# Patient Record
Sex: Female | Born: 1975 | Race: Black or African American | Hispanic: No | Marital: Single | Smoking: Current every day smoker
Health system: Southern US, Community
[De-identification: ages and names within clinical notes are randomized; demographics above are authoritative.]

## PROBLEM LIST (undated history)

## (undated) DIAGNOSIS — Z720 Tobacco use: Secondary | ICD-10-CM

## (undated) DIAGNOSIS — I110 Hypertensive heart disease with heart failure: Secondary | ICD-10-CM

## (undated) DIAGNOSIS — J189 Pneumonia, unspecified organism: Secondary | ICD-10-CM

## (undated) DIAGNOSIS — I119 Hypertensive heart disease without heart failure: Secondary | ICD-10-CM

## (undated) DIAGNOSIS — D649 Anemia, unspecified: Secondary | ICD-10-CM

## (undated) DIAGNOSIS — I5030 Unspecified diastolic (congestive) heart failure: Secondary | ICD-10-CM

## (undated) DIAGNOSIS — I10 Essential (primary) hypertension: Secondary | ICD-10-CM

## (undated) DIAGNOSIS — G4733 Obstructive sleep apnea (adult) (pediatric): Secondary | ICD-10-CM

## (undated) DIAGNOSIS — N189 Chronic kidney disease, unspecified: Secondary | ICD-10-CM

## (undated) DIAGNOSIS — I422 Other hypertrophic cardiomyopathy: Secondary | ICD-10-CM

## (undated) DIAGNOSIS — M1990 Unspecified osteoarthritis, unspecified site: Secondary | ICD-10-CM

## (undated) HISTORY — DX: Chronic kidney disease, unspecified: N18.9

## (undated) HISTORY — DX: Obstructive sleep apnea (adult) (pediatric): G47.33

---

## 2010-11-24 ENCOUNTER — Emergency Department (HOSPITAL_COMMUNITY)
Admission: EM | Admit: 2010-11-24 | Discharge: 2010-11-24 | Disposition: A | Discharge status: Home / Self Care | Payer: Self-pay | Attending: Emergency Medicine | Admitting: Emergency Medicine

## 2010-11-24 DIAGNOSIS — T169XXA Foreign body in ear, unspecified ear, initial encounter: Secondary | ICD-10-CM | POA: Insufficient documentation

## 2010-11-24 DIAGNOSIS — Y92009 Unspecified place in unspecified non-institutional (private) residence as the place of occurrence of the external cause: Secondary | ICD-10-CM | POA: Insufficient documentation

## 2010-11-24 DIAGNOSIS — IMO0002 Reserved for concepts with insufficient information to code with codable children: Secondary | ICD-10-CM | POA: Insufficient documentation

## 2010-11-24 DIAGNOSIS — I10 Essential (primary) hypertension: Secondary | ICD-10-CM | POA: Insufficient documentation

## 2012-07-05 HISTORY — PX: AV FISTULA PLACEMENT: SHX1204

## 2012-07-25 ENCOUNTER — Emergency Department (HOSPITAL_COMMUNITY): Payer: Medicaid Other

## 2012-07-25 ENCOUNTER — Inpatient Hospital Stay (HOSPITAL_COMMUNITY): Payer: Medicaid Other

## 2012-07-25 ENCOUNTER — Encounter (HOSPITAL_COMMUNITY): Payer: Self-pay | Admitting: Emergency Medicine

## 2012-07-25 ENCOUNTER — Inpatient Hospital Stay (HOSPITAL_COMMUNITY)
Admission: EM | Admit: 2012-07-25 | Discharge: 2012-08-02 | DRG: 264 | Disposition: A | Discharge status: Home / Self Care | Payer: Medicaid Other | Attending: Family Medicine | Admitting: Family Medicine

## 2012-07-25 DIAGNOSIS — IMO0001 Reserved for inherently not codable concepts without codable children: Principal | ICD-10-CM | POA: Diagnosis present

## 2012-07-25 DIAGNOSIS — D72829 Elevated white blood cell count, unspecified: Secondary | ICD-10-CM | POA: Diagnosis present

## 2012-07-25 DIAGNOSIS — G4733 Obstructive sleep apnea (adult) (pediatric): Secondary | ICD-10-CM | POA: Diagnosis present

## 2012-07-25 DIAGNOSIS — N179 Acute kidney failure, unspecified: Secondary | ICD-10-CM | POA: Diagnosis present

## 2012-07-25 DIAGNOSIS — I498 Other specified cardiac arrhythmias: Secondary | ICD-10-CM | POA: Diagnosis present

## 2012-07-25 DIAGNOSIS — I43 Cardiomyopathy in diseases classified elsewhere: Secondary | ICD-10-CM | POA: Diagnosis present

## 2012-07-25 DIAGNOSIS — I82619 Acute embolism and thrombosis of superficial veins of unspecified upper extremity: Secondary | ICD-10-CM | POA: Diagnosis present

## 2012-07-25 DIAGNOSIS — I214 Non-ST elevation (NSTEMI) myocardial infarction: Secondary | ICD-10-CM

## 2012-07-25 DIAGNOSIS — N039 Chronic nephritic syndrome with unspecified morphologic changes: Secondary | ICD-10-CM | POA: Diagnosis present

## 2012-07-25 DIAGNOSIS — R7989 Other specified abnormal findings of blood chemistry: Secondary | ICD-10-CM

## 2012-07-25 DIAGNOSIS — E872 Acidosis, unspecified: Secondary | ICD-10-CM | POA: Diagnosis present

## 2012-07-25 DIAGNOSIS — Z6841 Body Mass Index (BMI) 40.0 and over, adult: Secondary | ICD-10-CM

## 2012-07-25 DIAGNOSIS — F39 Unspecified mood [affective] disorder: Secondary | ICD-10-CM | POA: Diagnosis present

## 2012-07-25 DIAGNOSIS — F17200 Nicotine dependence, unspecified, uncomplicated: Secondary | ICD-10-CM | POA: Diagnosis present

## 2012-07-25 DIAGNOSIS — E876 Hypokalemia: Secondary | ICD-10-CM | POA: Diagnosis not present

## 2012-07-25 DIAGNOSIS — I509 Heart failure, unspecified: Secondary | ICD-10-CM | POA: Diagnosis present

## 2012-07-25 DIAGNOSIS — N185 Chronic kidney disease, stage 5: Secondary | ICD-10-CM | POA: Diagnosis present

## 2012-07-25 DIAGNOSIS — N2581 Secondary hyperparathyroidism of renal origin: Secondary | ICD-10-CM | POA: Diagnosis present

## 2012-07-25 DIAGNOSIS — I5033 Acute on chronic diastolic (congestive) heart failure: Secondary | ICD-10-CM | POA: Diagnosis present

## 2012-07-25 DIAGNOSIS — Z79899 Other long term (current) drug therapy: Secondary | ICD-10-CM

## 2012-07-25 DIAGNOSIS — R5381 Other malaise: Secondary | ICD-10-CM | POA: Diagnosis present

## 2012-07-25 DIAGNOSIS — E039 Hypothyroidism, unspecified: Secondary | ICD-10-CM | POA: Diagnosis present

## 2012-07-25 DIAGNOSIS — R000 Tachycardia, unspecified: Secondary | ICD-10-CM | POA: Diagnosis present

## 2012-07-25 DIAGNOSIS — Z720 Tobacco use: Secondary | ICD-10-CM

## 2012-07-25 DIAGNOSIS — Z66 Do not resuscitate: Secondary | ICD-10-CM | POA: Diagnosis present

## 2012-07-25 DIAGNOSIS — D631 Anemia in chronic kidney disease: Secondary | ICD-10-CM | POA: Diagnosis present

## 2012-07-25 DIAGNOSIS — I119 Hypertensive heart disease without heart failure: Secondary | ICD-10-CM | POA: Diagnosis present

## 2012-07-25 DIAGNOSIS — N184 Chronic kidney disease, stage 4 (severe): Secondary | ICD-10-CM | POA: Diagnosis present

## 2012-07-25 DIAGNOSIS — I5030 Unspecified diastolic (congestive) heart failure: Secondary | ICD-10-CM | POA: Diagnosis present

## 2012-07-25 DIAGNOSIS — I10 Essential (primary) hypertension: Secondary | ICD-10-CM | POA: Insufficient documentation

## 2012-07-25 DIAGNOSIS — I110 Hypertensive heart disease with heart failure: Secondary | ICD-10-CM

## 2012-07-25 HISTORY — DX: Hypertensive heart disease with heart failure: I11.0

## 2012-07-25 HISTORY — DX: Unspecified diastolic (congestive) heart failure: I50.30

## 2012-07-25 HISTORY — DX: Hypertensive heart disease without heart failure: I11.9

## 2012-07-25 HISTORY — DX: Morbid (severe) obesity due to excess calories: E66.01

## 2012-07-25 HISTORY — DX: Essential (primary) hypertension: I10

## 2012-07-25 HISTORY — DX: Other hypertrophic cardiomyopathy: I42.2

## 2012-07-25 HISTORY — DX: Tobacco use: Z72.0

## 2012-07-25 LAB — CBC WITH DIFFERENTIAL/PLATELET
Basophils Absolute: 0 10*3/uL (ref 0.0–0.1)
HCT: 36.3 % (ref 36.0–46.0)
Hemoglobin: 13.1 g/dL (ref 12.0–15.0)
Lymphocytes Relative: 16 % (ref 12–46)
Lymphs Abs: 1.9 10*3/uL (ref 0.7–4.0)
Monocytes Absolute: 0.7 10*3/uL (ref 0.1–1.0)
Monocytes Relative: 6 % (ref 3–12)
Neutro Abs: 9.2 10*3/uL — ABNORMAL HIGH (ref 1.7–7.7)
RBC: 4.58 MIL/uL (ref 3.87–5.11)
WBC: 12.1 10*3/uL — ABNORMAL HIGH (ref 4.0–10.5)

## 2012-07-25 LAB — BASIC METABOLIC PANEL
BUN: 33 mg/dL — ABNORMAL HIGH (ref 6–23)
CO2: 18 mEq/L — ABNORMAL LOW (ref 19–32)
Chloride: 104 mEq/L (ref 96–112)
Creatinine, Ser: 4.03 mg/dL — ABNORMAL HIGH (ref 0.50–1.10)
Glucose, Bld: 104 mg/dL — ABNORMAL HIGH (ref 70–99)

## 2012-07-25 LAB — D-DIMER, QUANTITATIVE: D-Dimer, Quant: 2.46 ug/mL-FEU — ABNORMAL HIGH (ref 0.00–0.48)

## 2012-07-25 LAB — POCT I-STAT TROPONIN I

## 2012-07-25 MED ORDER — NITROGLYCERIN 0.4 MG SL SUBL: 0.4000 mg | SUBLINGUAL_TABLET | SUBLINGUAL | Status: DC | PRN

## 2012-07-25 MED ORDER — DEXTROSE 5 % IV SOLN
2.0000 mg/min | INTRAVENOUS | Status: DC
  Administered 2012-07-25: 2 mg/min via INTRAVENOUS
  Administered 2012-07-26: 1 mg/min via INTRAVENOUS
  Administered 2012-07-26: 2 mg/min via INTRAVENOUS
  Administered 2012-07-27 (×2): 0.75 mg/min via INTRAVENOUS
  Filled 2012-07-25 (×6): qty 100

## 2012-07-25 MED ORDER — FUROSEMIDE 10 MG/ML IJ SOLN
40.0000 mg | Freq: Once | INTRAMUSCULAR | Status: AC
  Administered 2012-07-25: 40 mg via INTRAVENOUS
  Filled 2012-07-25: qty 4

## 2012-07-25 MED ORDER — INSULIN ASPART 100 UNIT/ML ~~LOC~~ SOLN: 0.0000 [IU] | Freq: Every day | SUBCUTANEOUS | Status: DC

## 2012-07-25 MED ORDER — HYDRALAZINE HCL 20 MG/ML IJ SOLN: 10.0000 mg | Freq: Four times a day (QID) | INTRAMUSCULAR | Status: DC | PRN

## 2012-07-25 MED ORDER — WHITE PETROLATUM GEL
Status: AC
  Administered 2012-07-26: 0.2
  Filled 2012-07-25: qty 5

## 2012-07-25 MED ORDER — HEPARIN BOLUS VIA INFUSION
5000.0000 [IU] | Freq: Once | INTRAVENOUS | Status: AC
  Administered 2012-07-25: 5000 [IU] via INTRAVENOUS

## 2012-07-25 MED ORDER — TECHNETIUM TC 99M DIETHYLENETRIAME-PENTAACETIC ACID: 40.0000 | Freq: Once | INTRAVENOUS | Status: AC | PRN

## 2012-07-25 MED ORDER — HEPARIN (PORCINE) IN NACL 100-0.45 UNIT/ML-% IJ SOLN
1800.0000 [IU]/h | INTRAMUSCULAR | Status: AC
  Administered 2012-07-25: 1400 [IU]/h via INTRAVENOUS
  Administered 2012-07-26: 1750 [IU]/h via INTRAVENOUS
  Administered 2012-07-26 – 2012-07-28 (×4): 1800 [IU]/h via INTRAVENOUS
  Filled 2012-07-25 (×9): qty 250

## 2012-07-25 MED ORDER — INSULIN ASPART 100 UNIT/ML ~~LOC~~ SOLN: 0.0000 [IU] | Freq: Three times a day (TID) | SUBCUTANEOUS | Status: DC

## 2012-07-25 MED ORDER — ACETAMINOPHEN 325 MG PO TABS: 650.0000 mg | ORAL_TABLET | ORAL | Status: DC | PRN

## 2012-07-25 MED ORDER — ASPIRIN EC 81 MG PO TBEC
81.0000 mg | DELAYED_RELEASE_TABLET | Freq: Every day | ORAL | Status: DC
  Administered 2012-07-26 – 2012-08-02 (×7): 81 mg via ORAL
  Filled 2012-07-25 (×8): qty 1

## 2012-07-25 MED ORDER — ASPIRIN 325 MG PO TABS
325.0000 mg | ORAL_TABLET | Freq: Once | ORAL | Status: AC
  Administered 2012-07-25: 325 mg via ORAL
  Filled 2012-07-25: qty 1

## 2012-07-25 MED ORDER — HEPARIN BOLUS VIA INFUSION: 4000.0000 [IU] | Freq: Once | INTRAVENOUS | Status: DC

## 2012-07-25 MED ORDER — TECHNETIUM TO 99M ALBUMIN AGGREGATED
6.0000 | Freq: Once | INTRAVENOUS | Status: AC | PRN
Start: 2012-07-25 — End: 2012-07-25
  Administered 2012-07-25: 6 via INTRAVENOUS

## 2012-07-25 MED ORDER — SODIUM CHLORIDE 0.9 % IJ SOLN
3.0000 mL | Freq: Two times a day (BID) | INTRAMUSCULAR | Status: DC
  Administered 2012-07-26 – 2012-08-02 (×13): 3 mL via INTRAVENOUS

## 2012-07-25 MED ORDER — FUROSEMIDE 10 MG/ML IJ SOLN
80.0000 mg | Freq: Two times a day (BID) | INTRAMUSCULAR | Status: DC
  Administered 2012-07-26 (×3): 80 mg via INTRAVENOUS
  Filled 2012-07-25 (×6): qty 8

## 2012-07-25 MED ORDER — ATORVASTATIN CALCIUM 20 MG PO TABS
20.0000 mg | ORAL_TABLET | Freq: Every day | ORAL | Status: DC
  Administered 2012-07-26 – 2012-08-01 (×6): 20 mg via ORAL
  Filled 2012-07-25 (×8): qty 1

## 2012-07-25 MED ORDER — SODIUM CHLORIDE 0.9 % IV SOLN: 250.0000 mL | INTRAVENOUS | Status: DC | PRN

## 2012-07-25 MED ORDER — SODIUM CHLORIDE 0.9 % IJ SOLN: 3.0000 mL | INTRAMUSCULAR | Status: DC | PRN

## 2012-07-25 MED ORDER — ONDANSETRON HCL 4 MG/2ML IJ SOLN: 4.0000 mg | Freq: Four times a day (QID) | INTRAMUSCULAR | Status: DC | PRN

## 2012-07-25 MED ORDER — HYDRALAZINE HCL 25 MG PO TABS
25.0000 mg | ORAL_TABLET | Freq: Three times a day (TID) | ORAL | Status: DC
  Administered 2012-07-26 – 2012-07-27 (×3): 25 mg via ORAL
  Filled 2012-07-25 (×8): qty 1

## 2012-07-25 NOTE — ED Notes (Signed)
Attempted to call report.  Was told rn would return my call shortly

## 2012-07-25 NOTE — ED Notes (Signed)
Patient transported to X-ray 

## 2012-07-25 NOTE — Progress Notes (Addendum)
ANTICOAGULATION CONSULT NOTE - Initial Consult  Pharmacy Consult for heparin Indication: chest pain/ACS + r/o PE  No Known Allergies  Patient Measurements:   Heparin Dosing Weight: 91.6kg  Vital Signs: Temp: 98.3 F (36.8 C) (02/21 1656) Temp src: Oral (02/21 1656) BP: 124/103 mmHg (02/21 1656) Pulse Rate: 120 (02/21 1656)  Labs:  Recent Labs  07/25/12 1708  HGB 13.1  HCT 36.3  PLT 215  CREATININE 4.03*    CrCl is unknown because there is no height on file for the current visit.   Medical History: Past Medical History  Diagnosis Date  . Hypertension     Medications:  Infusions:  . heparin    . heparin      Assessment: 16 yof presented with SOB and CP. Troponin is mildly elevated and d-dimer is elevated as well. To start IV heparin for CP and r/o PE.   Goal of Therapy:  Heparin level 0.3-0.7 units/ml Monitor platelets by anticoagulation protocol: Yes   Plan:  1. Heparin bolus 5000 units IV x 1 2. Heparin gtt 1400 units/hr 3. Check an 8 hour heparin level 4. Daily heparin level and CBC 5. F/u CT for r/o PE  Ayham Word, Rande Lawman 07/25/2012,6:17 PM

## 2012-07-25 NOTE — ED Provider Notes (Signed)
History     CSN: [id number]  Arrival date & time 07/25/12  1648   First MD Initiated Contact with Patient 07/25/12 1751      Chief Complaint  Patient presents with  . Shortness of Breath    (Consider location/radiation/quality/duration/timing/severity/associated sxs/prior treatment) The history is provided by the patient.  Brittney Contreras is a 37 y.o. female here with shortness of breath and chest pain. Chest pain worse with exertion and worse with laying back for the last 3 weeks. She also has bilateral leg swelling but denies any history of PE or DVTs. He said her chest pain got worse this morning as what she is here. No cardiac issues but she is a smoker.   Past Medical History  Diagnosis Date  . Hypertension     History reviewed. No pertinent past surgical history.  History reviewed. No pertinent family history.  History  Substance Use Topics  . Smoking status: Current Every Day Smoker  . Smokeless tobacco: Not on file  . Alcohol Use: Yes     Comment: occ    OB History   Grav Para Term Preterm Abortions TAB SAB Ect Mult Living                  Review of Systems  Respiratory: Positive for shortness of breath.   Cardiovascular: Positive for chest pain.  All other systems reviewed and are negative.    Allergies  Review of patient's allergies indicates no known allergies.  Home Medications  No current outpatient prescriptions on file.  BP 222/147  Pulse 102  Temp(Src) 98.3 F (36.8 C) (Oral)  Resp 19  SpO2 93%  LMP 07/16/2012  Physical Exam  Nursing note and vitals reviewed. Constitutional: She is oriented to person, place, and time. She appears well-developed and well-nourished.  Uncomfortable, tachypneic   HENT:  Head: Normocephalic.  Mouth/Throat: Oropharynx is clear and moist.  Eyes: Conjunctivae are normal. Pupils are equal, round, and reactive to light.  Neck: Normal range of motion. Neck supple.  Cardiovascular: Regular rhythm and normal  heart sounds.   Tachycardic   Pulmonary/Chest: Effort normal and breath sounds normal. No respiratory distress. She has no wheezes. She has no rales.  Abdominal: Soft. Bowel sounds are normal.  Obese, nontender   Musculoskeletal: Normal range of motion.  1+ edema no calf tenderness   Neurological: She is alert and oriented to person, place, and time.  Skin: Skin is warm and dry.  Psychiatric: She has a normal mood and affect. Her behavior is normal. Judgment and thought content normal.    ED Course  Procedures (including critical care time)  CRITICAL CARE Performed by: Darl Householder, Mckinzee Spirito   Total critical care time: 45 min   Critical care time was exclusive of separately billable procedures and treating other patients.  Critical care was necessary to treat or prevent imminent or life-threatening deterioration.  Critical care was time spent personally by me on the following activities: development of treatment plan with patient and/or surrogate as well as nursing, discussions with consultants, evaluation of patient's response to treatment, examination of patient, obtaining history from patient or surrogate, ordering and performing treatments and interventions, ordering and review of laboratory studies, ordering and review of radiographic studies, pulse oximetry and re-evaluation of patient's condition.   Labs Reviewed  CBC WITH DIFFERENTIAL - Abnormal; Notable for the following:    WBC 12.1 (*)    MCHC 36.1 (*)    Neutro Abs 9.2 (*)    All other  components within normal limits  BASIC METABOLIC PANEL - Abnormal; Notable for the following:    CO2 18 (*)    Glucose, Bld 104 (*)    BUN 33 (*)    Creatinine, Ser 4.03 (*)    GFR calc non Af Amer 13 (*)    GFR calc Af Amer 15 (*)    All other components within normal limits  D-DIMER, QUANTITATIVE - Abnormal; Notable for the following:    D-Dimer, Quant 2.46 (*)    All other components within normal limits  PRO B NATRIURETIC PEPTIDE -  Abnormal; Notable for the following:    Pro B Natriuretic peptide (BNP) 20946.0 (*)    All other components within normal limits  POCT I-STAT TROPONIN I - Abnormal; Notable for the following:    Troponin i, poc 0.09 (*)    All other components within normal limits  CBC  HEPARIN LEVEL (UNFRACTIONATED)  BASIC METABOLIC PANEL  TSH  HEMOGLOBIN A1C  LIPID PANEL   Dg Chest 2 View  07/25/2012  *RADIOLOGY REPORT*  Clinical Data: Shortness of breath and cough.  CHEST - 2 VIEW  Comparison: No priors.  Findings: Lung volumes are normal.  No acute consolidative air space disease.  Mild diffuse interstitial prominence with some Kerley B lines in the periphery of the lungs.  Cephalization of the pulmonary vasculature.  Borderline cardiomegaly.  Upper mediastinal contours are within normal limits.  No definite pleural effusions.  IMPRESSION: 1.  Borderline cardiomegaly with findings suspicious for mild interstitial pulmonary edema.  Clinical correlation for signs and symptoms of mild congestive heart failure is recommended.   Original Report Authenticated By: Vinnie Langton, M.D.      No diagnosis found.   Date: 07/25/2012  Rate: 107  Rhythm: sinus tachycardia  QRS Axis: normal  Intervals: normal  ST/T Wave abnormalities: nonspecific ST changes  Conduction Disutrbances:none  Narrative Interpretation:   Old EKG Reviewed: none available     MDM  Brittney Contreras is a 37 y.o. female here with SOB, CP. Concerned for ACS vs CHF vs PE. Will get d-dimer, trop, CXR, BNP.   6:30 PM I called cardiology, Dr. Jake Michaelis, who will see her in the ED. Trop positive. I started heparin drip.   7 PM Cardiology at bedside. D-dimer positive, Cr 4. Can't get CTPA due to renal failure. VQ scan ordered. BNP 20000, lasix ordered. Cardiology ordered echo. I called Family medicine, who accepted the patient to step down.          Brittney Arthurs, MD 07/25/12 2015

## 2012-07-25 NOTE — ED Notes (Signed)
MD at bedside. Cardiology 

## 2012-07-25 NOTE — ED Notes (Signed)
Pt c/o increased SOB with exertion x 3 weeks and worse when laying flat; pt sts some swelling in legs

## 2012-07-25 NOTE — ED Notes (Signed)
Pt back from x-ray.

## 2012-07-25 NOTE — ED Notes (Signed)
Pt transported to VQ scan. 

## 2012-07-25 NOTE — H&P (Signed)
Family Medicine Teaching Service Admission H&P Service Pager: [phone]  Patient name: Brittney Contreras Medical record number: [id number] Date of birth: 01-30-1976 Age: 37 y.o. Gender: female  Primary Care Provider: Default, Provider, MD Attending Physician: Alveda Reasons, MD Consultants:   CARDIOLOGY: [facility]  NEPHROLOGY: need to be called  CODE STATUS: DNR  CC  Dyspnea  HPI  Brittney Contreras is a 37 y.o. year old female presenting with acute 1 day worsening of subacute dyspnea.  She reports she has had progressively worsening shortness of breath over the past 3 weeks, resulting in 3 pillow orthopnea, significant dyspnea on exertion (short of breath with going to bathroom) and acute worsened today.  She has associated non productive cough and occasional CP.  She is unable to state the last time she saw a doctor but has previously been diagnosed with HTN during in ED visit for ear pain in June of 2012.  Her BP was noted to be 246/105 during that ED and she was started on Hydralazine and directed to establish care.  No further workup was completed at that time.    On presentation to the ED her BP was 124/103 however increased to 222/147.  Other labs were pertinent for a POC troponin that was elevated to 0.09, acute kidney failure with a Cr of 4.03, CXR and proBNP [id number]) suggesting Congestive Heart Failure, and a D-Dimer of 2.46.  Cardiology evaluated her and started her on a Heparin drip, Lebetalol drip, prn Hydralazine, and IV lasix.  2D ECHO ordered.  EKG showing some evidence of ischemia with inverted T waves diffusely.  Her Lebetalol drip was delayed due to pt being in VQ scan to evaluate for PE.  ROS   Constitutional No   Infectious No fevers, no chills  Resp Dyspnea as above, no cough  Cardiac No overt chest pain, orthopnea  GI No N/V, no melana, no hematachezia  GU No dysuria, no changes in urinary habitis, urine clear  Psych No changes in mood, affect. Denies anxiety.  Reports  being "hard headed" and that's why she hasn't seen a doctor  Neuro No changes in vision, no headache  Activity Severely limited  Diet Ad lib, not restricting  MEDS None     HISTORY  PMHx:  Past Medical History  Diagnosis Date  . Hypertension     PSHx: History reviewed. No pertinent past surgical history.  Social Hx: History   Social History  . Marital Status: Single    Spouse Name: N/A    Number of Children: N/A  . Years of Education: N/A   Social History Main Topics  . Smoking status: Current Some Day Smoker    Types: Cigarettes  . Smokeless tobacco: None  . Alcohol Use: Yes     Comment: occ  . Drug Use: No     Comment: No hx of IV Use  . Sexually Active: None   Other Topics Concern  . None   Social History Narrative   Lives with her uncle   Was previously a CNA but currently unemployeed          Family Hx: Family History  Problem Relation Age of Onset  . Kidney disease      Aunt, deceased, had been on dialysis  . Hypertension Mother   . Heart disease Mother     Allergies: No Known Allergies  Home Medications: No prescriptions prior to admission    OBJECTIVE  Vitals: Temp:  [98.1 F (36.7 C)-98.3 F (36.8 C)]  98.1 F (36.7 C) (02/21 2231) Pulse Rate:  [83-120] 83 (02/21 2231) Resp:  [18-19] 19 (02/21 2231) BP: (124-222)/(103-147) 190/131 mmHg (02/21 2231) SpO2:  [93 %-95 %] 95 % (02/21 2231) Weight:  [291 lb 3.6 oz (132.1 kg)] 291 lb 3.6 oz (132.1 kg) (02/21 2231)  Weight: Wt Readings from Last 3 Encounters:  07/25/12 291 lb 3.6 oz (132.1 kg)    I&Os: Yesterday:   This shift:     PE: GENERAL:  Adult Morbidly Obese  female. In no discomfort; no respiratory distress. Sitting up in bed PSYCH: Alert and appropriately interactive; Insight:Good   H&N: AT/[facility], trachea midline EENT:  MMM, no scleral icterus, EOMi HEART: RRR, S1/S2 heard, no murmur LUNGS: CTA B, no wheezes, no crackles EXTREMITIES: Moves all 4 extremities  spontaneously, warm well perfused, no edema, bilateral DP and PT pulses 2/4.        LABS: CBC BMET   Recent Labs Lab 07/25/12 1708  WBC 12.1*  HGB 13.1  HCT 36.3  PLT 215    Recent Labs Lab 07/25/12 1708  NA 137  K 3.6  CL 104  CO2 18*  BUN 33*  CREATININE 4.03*  GLUCOSE 104*  CALCIUM 8.6      Recent Labs Lab 07/25/12 1739  TROPIPOC 0.09*    07/25/2012 17:08  D-Dimer, Quant 2.46 (H)    07/25/2012 17:11  Pro B Natriuretic peptide (BNP) 20946.0 (H)   URINE STUDIES: Pending >>>  MICRO: None  IMAGING: Dg Chest 2 View  07/25/2012  *RADIOLOGY REPORT*  Clinical Data: Shortness of breath and cough.  CHEST - 2 VIEW  Comparison: No priors.  Findings: Lung volumes are normal.  No acute consolidative air space disease.  Mild diffuse interstitial prominence with some Kerley B lines in the periphery of the lungs.  Cephalization of the pulmonary vasculature.  Borderline cardiomegaly.  Upper mediastinal contours are within normal limits.  No definite pleural effusions.  IMPRESSION: 1.  Borderline cardiomegaly with findings suspicious for mild interstitial pulmonary edema.  Clinical correlation for signs and symptoms of mild congestive heart failure is recommended.   Original Report Authenticated By: Vinnie Langton, M.D.    Nm Pulmonary Perf And Vent  07/25/2012  *RADIOLOGY REPORT*  Clinical Data:  Shortness of breath.  Rule out pulmonary embolism.  NUCLEAR MEDICINE VENTILATION - PERFUSION LUNG SCAN  Technique:  Ventilation images were obtained in multiple projections using inhaled aerosol technetium 99 M DTPA.  Perfusion images were obtained in multiple projections after intravenous injection of Tc-16m MAA.  Radiopharmaceuticals:  21mCi Tc-32m DTPA aerosol and 6.0 mCi Tc-70m MAA.  Comparison: Plain film earlier today.  Findings:  Ventilation:  No defects identified.  Perfusion:   No wedge shaped peripheral perfusion defects to suggest acute pulmonary embolism  IMPRESSION: No  evidence of pulmonary embolism.   Original Report Authenticated By: Abigail Miyamoto, M.D.     Medications:   . heparin 1,400 Units/hr (07/25/12 1925)  . labetalol (NORMODYNE) infusion 2 mg/min (07/25/12 2106)   . [START ON 07/26/2012] aspirin EC  81 mg Oral Daily  . [START ON 07/26/2012] atorvastatin  20 mg Oral q1800  . furosemide  80 mg Intravenous BID  . hydrALAZINE  25 mg Oral Q8H  . [START ON 07/26/2012] insulin aspart  0-15 Units Subcutaneous TID WC  . insulin aspart  0-5 Units Subcutaneous QHS  . sodium chloride  3 mL Intravenous Q12H  . white petrolatum         Assessment & Plan  LOS: [age] y.o. year old female with 1 day worsening of subacute (70months) of dyspnea.  Found to have a Hypertensive Emergency with elevated troponin's, acute renal failure and new onset pulmonary Edema, likely congestive heart failure vs flash pulmonary edema.  # ACS: positive troponins, mild EKG changes (inverted T waves), no ST elevation.  [facility] following.  Will risk stratify  - Heparin Drip  - ASA, statin  - risk stratification labs: Lipids, A1c, TSH  - will trend Troponin's, also POC troponin often unreliable   # HTN Emergency, Sinus Tachycardia: evidence of end organ damage = Kidney, Heart.  Will need to avoid ACEi given current renal Failure but will likely need as OP  - Called to have LEBETALOL drip started STAT  - Hydralazine prn  - Frequent monitoring  # Dyspnea (consider Acute on Chronic CHF exacerbation vs flash pulmonary edema from HTN emergency): volume overloaded, ECHO pending.  Consider Cardiorenal syndrome.  May need to stop Lebetalol drip and change to nicardipine drip given contraindication in acute CHF if ECHO is revealing.  No suplemental oxygen requirement  - ECHO  - control BP  - BiPAP prn, consider Nitro Drip  - Lasix  - VQ scan for + d-dimer and AKI  > no evidence  # Renal Failure, likely acute on chronic: no baseline Cr.  Will initiate workup for acute renal failure  but likely is due to HTN nephropathy due to poorly controlled HTN.  Consider Cardiorenal syndrome in this setting.  Will need to monitor Cr given current diuresis.  Consider RAS/2oHTN but defer workup at this time.  - Renal US   - Urine studies: UA;  Na, Cr, Pr >>> FeNa & Pr:Cr; however has received Lasix  - Foley cath with strict I/O  - Renal Consult in AM  # Likely OSA: will need OP workup but will provide BiPAP vs CPAP acutely at night  BiPAP  # Obesity: Likely associated metabolic syndrome.  Will need TLC emphasized  - Monitor for hyperglycemia, SSI for coverage  # Tobacco Abuse: Light use, no evidence of nicotine dependence  # Leukocytosis: afebrile, no evidence for infection at this time.  Likely stress demargination.  UA pending.  Continue to montior  --- FEN:  *SL -Cardiac --- PPx: Heparin Drip    Disposition  Pt will be admitted to SDU for further BP control and cardiac monitoring.     Gerda Diss, DO [facility] Resident - PGY-2  07/25/2012 11:39 PM    Addendum: Once pt arrived to SDU per protocol must be in ICU for Lebetalol drip.  Will transfer to 2900 ICU status for drip.  Cardiology is consulting but has ability to admit pt's to 2900.  Discussed with both Dr. Claiborne Billings ([facility]) and Dr. Festus Aloe (CCM) who agree with this plan.  FMTS still remains primary, CCM declines need for consult at this time.  Gerda Diss, DO [facility] Resident - PGY-2 07/26/2012 12:13 AM

## 2012-07-25 NOTE — Consult Note (Signed)
Reason for Consult: CHF/ +trop Referring Physician:   [name] is an 37 y.o. female.  HPI:  The patient is a morbidly obese AA female with a history of untreated HTN. She developed SOB about three weeks ago with three pillow orthopnea.  It has been getting progressively worse. She also complains of polyuria, nocturia, polydipsia, cough,  CP sometimes when trying to catch her breath.  She denies N, V, fever, abd pain, hematochezia, melena, hematuria.  Past Medical History  Diagnosis Date  . Hypertension     History reviewed. No pertinent past surgical history.  History reviewed. No pertinent family history.  Social History:  reports that she has been smoking.  She does not have any smokeless tobacco history on file. She reports that  drinks alcohol. She reports that she does not use illicit drugs.  Allergies: No Known Allergies  Medications: None   Results for orders placed during the hospital encounter of 07/25/12 (from the past 48 hour(s))  CBC WITH DIFFERENTIAL     Status: Abnormal   Collection Time    07/25/12  5:08 PM      Result Value Range   WBC 12.1 (*) 4.0 - 10.5 K/uL   RBC 4.58  3.87 - 5.11 MIL/uL   Hemoglobin 13.1  12.0 - 15.0 g/dL   HCT 36.3  36.0 - 46.0 %   MCV 79.3  78.0 - 100.0 fL   MCH 28.6  26.0 - 34.0 pg   MCHC 36.1 (*) 30.0 - 36.0 g/dL   RDW 14.3  11.5 - 15.5 %   Platelets 215  150 - 400 K/uL   Neutrophils Relative 77  43 - 77 %   Neutro Abs 9.2 (*) 1.7 - 7.7 K/uL   Lymphocytes Relative 16  12 - 46 %   Lymphs Abs 1.9  0.7 - 4.0 K/uL   Monocytes Relative 6  3 - 12 %   Monocytes Absolute 0.7  0.1 - 1.0 K/uL   Eosinophils Relative 1  0 - 5 %   Eosinophils Absolute 0.2  0.0 - 0.7 K/uL   Basophils Relative 0  0 - 1 %   Basophils Absolute 0.0  0.0 - 0.1 K/uL  BASIC METABOLIC PANEL     Status: Abnormal   Collection Time    07/25/12  5:08 PM      Result Value Range   Sodium 137  135 - 145 mEq/L   Potassium 3.6  3.5 - 5.1 mEq/L   Chloride 104  96 -  112 mEq/L   CO2 18 (*) 19 - 32 mEq/L   Glucose, Bld 104 (*) 70 - 99 mg/dL   BUN 33 (*) 6 - 23 mg/dL   Creatinine, Ser 4.03 (*) 0.50 - 1.10 mg/dL   Calcium 8.6  8.4 - 10.5 mg/dL   GFR calc non Af Amer 13 (*) >90 mL/min   GFR calc Af Amer 15 (*) >90 mL/min   Comment:            The eGFR has been calculated     using the CKD EPI equation.     This calculation has not been     validated in all clinical     situations.     eGFR's persistently     <90 mL/min signify     possible Chronic Kidney Disease.  D-DIMER, QUANTITATIVE     Status: Abnormal   Collection Time    07/25/12  5:08 PM      Result  Value Range   D-Dimer, Quant 2.46 (*) 0.00 - 0.48 ug/mL-FEU   Comment:            AT THE INHOUSE ESTABLISHED CUTOFF     VALUE OF 0.48 ug/mL FEU,     THIS ASSAY HAS BEEN DOCUMENTED     IN THE LITERATURE TO HAVE     A SENSITIVITY AND NEGATIVE     PREDICTIVE VALUE OF AT LEAST     98 TO 99%.  THE TEST RESULT     SHOULD BE CORRELATED WITH     AN ASSESSMENT OF THE CLINICAL     PROBABILITY OF DVT / VTE.  PRO B NATRIURETIC PEPTIDE     Status: Abnormal   Collection Time    07/25/12  5:11 PM      Result Value Range   Pro B Natriuretic peptide (BNP) 20946.0 (*) 0 - 125 pg/mL  POCT I-STAT TROPONIN I     Status: Abnormal   Collection Time    07/25/12  5:39 PM      Result Value Range   Troponin i, poc 0.09 (*) 0.00 - 0.08 ng/mL   Comment NOTIFIED PHYSICIAN     Comment 3            Comment: Due to the release kinetics of cTnI,     a negative result within the first hours     of the onset of symptoms does not rule out     myocardial infarction with certainty.     If myocardial infarction is still suspected,     repeat the test at appropriate intervals.    Dg Chest 2 View  07/25/2012  *RADIOLOGY REPORT*  Clinical Data: Shortness of breath and cough.  CHEST - 2 VIEW  Comparison: No priors.  Findings: Lung volumes are normal.  No acute consolidative air space disease.  Mild diffuse interstitial  prominence with some Kerley B lines in the periphery of the lungs.  Cephalization of the pulmonary vasculature.  Borderline cardiomegaly.  Upper mediastinal contours are within normal limits.  No definite pleural effusions.  IMPRESSION: 1.  Borderline cardiomegaly with findings suspicious for mild interstitial pulmonary edema.  Clinical correlation for signs and symptoms of mild congestive heart failure is recommended.   Original Report Authenticated By: Vinnie Langton, M.D.     Review of Systems  Constitutional: Negative for fever and diaphoresis.  HENT: Negative for congestion.   Respiratory: Positive for cough and shortness of breath.   Cardiovascular: Positive for chest pain, orthopnea and leg swelling. Negative for PND. Palpitations: Sometimes when trying to catch her breath.  Gastrointestinal: Negative for nausea, vomiting, abdominal pain, diarrhea, constipation, blood in stool and melena.  Genitourinary: Positive for frequency. Negative for dysuria and hematuria.  Neurological: Negative for dizziness.   Blood pressure 124/103, pulse 120, temperature 98.3 F (36.8 C), temperature source Oral, resp. rate 18, last menstrual period 07/16/2012, SpO2 95.00%. Physical Exam  Constitutional: She is oriented to person, place, and time. No distress.  Morbidly obese deconditioned.  HENT:  Head: Normocephalic and atraumatic.  Mouth/Throat: No oropharyngeal exudate.  Eyes: EOM are normal. Pupils are equal, round, and reactive to light. No scleral icterus.  Neck: Normal range of motion. Neck supple.  Cardiovascular: Regular rhythm, S1 normal and S2 normal.  Tachycardia present.   No murmur heard. Pulses:      Radial pulses are 2+ on the right side, and 2+ on the left side.       Dorsalis pedis  pulses are 2+ on the right side, and 2+ on the left side.  No carotid bruit  Respiratory: Effort normal and breath sounds normal. She has no wheezes. She has no rales.  GI: Soft. Bowel sounds are normal.  She exhibits no distension.  Musculoskeletal: She exhibits edema.  1+ LEE   Lymphadenopathy:    She has no cervical adenopathy.  Neurological: She is alert and oriented to person, place, and time. She exhibits normal muscle tone.  Skin: Skin is warm and dry.  Psychiatric: She has a normal mood and affect.    Assessment/Plan: Patient Active Problem List  Diagnosis  . Congestive heart failure  . Acute renal failure  . HTN (hypertension)  . Obesity, morbid  . Sinus tachycardia   Plan:  Will start her on IV hydralazine PRN and PO hydralazine for BP control. We will continue IV Lasix at 80mg  BID but will likely need higher dosing .  Insert Foley cath to monitor urine output.  The ER MD has ordered a VQ scan in the setting of elevated d-dimer.  She will likely need a renal consult.  Will also order Bipap.  She will also need A1C, TSH, 2D echo.    Tarri Fuller 07/25/2012, 6:57 PM    Patient seen and examined. Agree with assessment and plan. Pt is a 37 yo obese AAF who admits to untreated HTN for years with BP as elevated as >200. She cannot remember the last time she saw a physician. She presents with several weeks of sob, but today her dyspnea increased leading to ER evaluation. Presently BP 222/147, Cr 4 BNP 20946 and d-dimer 2.46. CXR suggestive of interstitial pulmonary edema. Will give a bolus of labetolol and start drip; consider nicardipine drip. Foley catheter, VQ scan, heparinize, IV Lasix 80 mg initially but may need 120 - 160, check 2-d echo. Suspect OSA with body habitus and oropharynx with Mallinpatti scale of 4. Will start BiPAP. Renal evaluation tomorrow.   Troy Sine, MD, [facility] 07/25/2012 7:15 PM

## 2012-07-26 ENCOUNTER — Inpatient Hospital Stay (HOSPITAL_COMMUNITY): Payer: Medicaid Other

## 2012-07-26 DIAGNOSIS — R791 Abnormal coagulation profile: Secondary | ICD-10-CM

## 2012-07-26 LAB — LIPID PANEL
Cholesterol: 214 mg/dL — ABNORMAL HIGH (ref 0–200)
HDL: 49 mg/dL (ref >39)
Total CHOL/HDL Ratio: 4.4 RATIO
Triglycerides: 128 mg/dL (ref <150)
VLDL: 26 mg/dL (ref 0–40)

## 2012-07-26 LAB — CBC
Hemoglobin: 11.4 g/dL — ABNORMAL LOW (ref 12.0–15.0)
MCHC: 36.9 g/dL — ABNORMAL HIGH (ref 30.0–36.0)

## 2012-07-26 LAB — BASIC METABOLIC PANEL
CO2: 17 mEq/L — ABNORMAL LOW (ref 19–32)
Calcium: 7.8 mg/dL — ABNORMAL LOW (ref 8.4–10.5)
GFR calc non Af Amer: 13 mL/min — ABNORMAL LOW (ref >90)
Sodium: 138 mEq/L (ref 135–145)

## 2012-07-26 LAB — POTASSIUM: Potassium: 3 mEq/L — ABNORMAL LOW (ref 3.5–5.1)

## 2012-07-26 LAB — TSH: TSH: 30.489 u[IU]/mL — ABNORMAL HIGH (ref 0.350–4.500)

## 2012-07-26 LAB — MAGNESIUM: Magnesium: 1.6 mg/dL (ref 1.5–2.5)

## 2012-07-26 LAB — URINALYSIS, ROUTINE W REFLEX MICROSCOPIC
Bilirubin Urine: NEGATIVE
Ketones, ur: NEGATIVE mg/dL
Protein, ur: 100 mg/dL — AB
Urobilinogen, UA: 0.2 mg/dL (ref 0.0–1.0)

## 2012-07-26 LAB — HEPATIC FUNCTION PANEL
AST: 17 U/L (ref 0–37)
Albumin: 2.8 g/dL — ABNORMAL LOW (ref 3.5–5.2)
Total Bilirubin: 0.4 mg/dL (ref 0.3–1.2)
Total Protein: 5.9 g/dL — ABNORMAL LOW (ref 6.0–8.3)

## 2012-07-26 LAB — PROTEIN / CREATININE RATIO, URINE
Creatinine, Urine: 68.01 mg/dL
Total Protein, Urine: 55.5 mg/dL

## 2012-07-26 LAB — HEPARIN LEVEL (UNFRACTIONATED)
Heparin Unfractionated: 0.16 IU/mL — ABNORMAL LOW (ref 0.30–0.70)
Heparin Unfractionated: 0.18 IU/mL — ABNORMAL LOW (ref 0.30–0.70)

## 2012-07-26 LAB — TROPONIN I: Troponin I: <0.3 ng/mL (ref <0.30)

## 2012-07-26 LAB — HEMOGLOBIN A1C
Hgb A1c MFr Bld: 5.2 % (ref <5.7)
Mean Plasma Glucose: 103 mg/dL (ref <117)

## 2012-07-26 LAB — URINE MICROSCOPIC-ADD ON

## 2012-07-26 MED ORDER — NITROGLYCERIN IN D5W 200-5 MCG/ML-% IV SOLN
INTRAVENOUS | Status: AC
  Filled 2012-07-26: qty 250

## 2012-07-26 MED ORDER — NITROGLYCERIN IN D5W 200-5 MCG/ML-% IV SOLN
2.0000 ug/min | INTRAVENOUS | Status: DC
  Administered 2012-07-26: 12 ug/min via INTRAVENOUS
  Administered 2012-07-26: 5 ug/min via INTRAVENOUS
  Administered 2012-07-26: 18 ug/min via INTRAVENOUS
  Administered 2012-07-27 – 2012-07-28 (×2): 35 ug/min via INTRAVENOUS
  Filled 2012-07-26 (×2): qty 250

## 2012-07-26 MED ORDER — INSULIN ASPART 100 UNIT/ML ~~LOC~~ SOLN: 0.0000 [IU] | Freq: Three times a day (TID) | SUBCUTANEOUS | Status: DC

## 2012-07-26 MED ORDER — HEPARIN BOLUS VIA INFUSION
1500.0000 [IU] | Freq: Once | INTRAVENOUS | Status: AC
  Administered 2012-07-26: 1500 [IU] via INTRAVENOUS
  Filled 2012-07-26: qty 1500

## 2012-07-26 MED ORDER — LABETALOL HCL 200 MG PO TABS
200.0000 mg | ORAL_TABLET | Freq: Two times a day (BID) | ORAL | Status: DC
  Filled 2012-07-26 (×2): qty 1

## 2012-07-26 MED ORDER — HEPARIN BOLUS VIA INFUSION
3000.0000 [IU] | Freq: Once | INTRAVENOUS | Status: AC
  Administered 2012-07-26: 3000 [IU] via INTRAVENOUS
  Filled 2012-07-26: qty 3000

## 2012-07-26 MED ORDER — POTASSIUM CHLORIDE CRYS ER 20 MEQ PO TBCR
40.0000 meq | EXTENDED_RELEASE_TABLET | Freq: Three times a day (TID) | ORAL | Status: AC
  Administered 2012-07-26 (×3): 40 meq via ORAL
  Filled 2012-07-26 (×3): qty 2

## 2012-07-26 NOTE — Progress Notes (Signed)
Pt resting in bed,watching CHF video .Handouts for PNA and FLU vaccines given .

## 2012-07-26 NOTE — Progress Notes (Signed)
Family Medicine Teaching Service Daily Progress Note Service Pager: [phone]  Subjective: Patient says she is feeling better, feels better not being on her feet.  She says her legs do not feel swollen now.    Objective: Vital signs in last 24 hours: Filed Vitals:   07/26/12 0404 07/26/12 0500 07/26/12 0600 07/26/12 0700  BP: 131/89 112/63 109/56 117/63  Pulse: 78 71 72 73  Temp:      TempSrc:      Resp: 17 20 19 23   Height:      Weight:      SpO2: 98% 92% 100% 93%   Weight change:   Intake/Output Summary (Last 24 hours) at 07/26/12 0934 Last data filed at 07/26/12 0854  Gross per 24 hour  Intake  539.4 ml  Output   1950 ml  Net -1410.6 ml   General appearance: alert, cooperative and no distress Neck: no JVD and supple, symmetrical, trachea midline Lungs: decreased breath soudns in bases, no rhonchi or wheezing Heart: distant heart sounds, regular rhythm, no murmur/rub/gallop heard Abdomen: obese, non-tender, +BS, no organomegaly Extremities: trace LE edema Pulses: 2+ and symmetric Lab Results:  Recent Labs  07/25/12 1708 07/26/12 0330  WBC 12.1* 9.1  HGB 13.1 11.4*  HCT 36.3 30.9*  PLT 215 188    Recent Labs  07/25/12 1708 07/26/12 0330  NA 137 138  K 3.6 2.8*  CL 104 106  CO2 18* 17*  GLUCOSE 104* 126*  BUN 33* 34*  CREATININE 4.03* 4.12*  CALCIUM 8.6 7.8*   Lab Results  Component Value Date   CHOL 214* 07/26/2012   HDL 49 07/26/2012   LDLCALC 139* 07/26/2012   TRIG 128 07/26/2012   CHOLHDL 4.4 07/26/2012   Micro Results: Recent Results (from the past 240 hour(s))  MRSA PCR SCREENING     Status: None   Collection Time    07/25/12 10:18 PM      Result Value Range Status   MRSA by PCR NEGATIVE  NEGATIVE Final   Comment:            The GeneXpert MRSA Assay (FDA     approved for NASAL specimens     only), is one component of a     comprehensive MRSA colonization     surveillance program. It is not     intended to diagnose MRSA     infection  nor to guide or     monitor treatment for     MRSA infections.   Studies/Results:  Medications: I have reviewed the patient's current medications. Scheduled Meds: . aspirin EC  81 mg Oral Daily  . atorvastatin  20 mg Oral q1800  . furosemide  80 mg Intravenous BID  . hydrALAZINE  25 mg Oral Q8H  . insulin aspart  0-15 Units Subcutaneous TID WC  . insulin aspart  0-5 Units Subcutaneous QHS  . labetalol  200 mg Oral BID  . potassium chloride  40 mEq Oral TID  . sodium chloride  3 mL Intravenous Q12H   Continuous Infusions: . heparin 1,750 Units/hr (07/26/12 0904)  . labetalol (NORMODYNE) infusion 1 mg/min (07/26/12 0400)  . nitroGLYCERIN 12 mcg/min (07/26/12 0923)   PRN Meds:.sodium chloride, acetaminophen, nitroGLYCERIN, ondansetron (ZOFRAN) IV, sodium chloride Assessment/Plan: Areen Bonsignore is a 37 y.o. female patient, Hospital day 1 for Hypertensive Emergency with elevated troponins, elevated BNP and clinical evidence of congestive heart failure, and acute renal failure:   # ACS: elevated POC troponin, mild EKG changes (inverted  T waves) on admission, now with 3 troponins neg.  [facility] following.  - ECG this morning showing new t-wave inversions in V1-6.  Heparin Drip, labetolol drip, and nitro drip.  Appreciate Cardiology input.  - ASA, statin  - risk stratification labs: Lipids Total Cholesterol and LDL elevated, A1c and TSH still pending -  ECHO ordered to assess EF.   # HTN Emergency, Sinus Tachycardia: evidence of end organ damage = Kidney, Heart. Will need to avoid ACEi given current renal Failure but will likely need as OP  - BP's now controlled on labetalol and nitro drips - Hydralazine prn  - Frequent monitoring   # Dyspnea (consider Acute on Chronic CHF exacerbation vs flash pulmonary edema from HTN emergency): volume overloaded, ECHO pending. Consider Cardiorenal syndrome.   - ECHO ordered - control BP  - CPAP ordered, pt only tolerated x 1 hour, I did  encourage her to wear this mask longer if she can because it can help with the fluid in her lungs and her breathing - Lasix 80 IV BID, is responding some but may need larger dose, will defer to renal considering elevated creatinine for continued management  - VQ scan negative, pt on heparin drip for concern for ACS  # Renal Failure, likely acute on chronic: no baseline Cr. 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 Consider Cardiorenal syndrome in this setting. Will need to monitor Cr given current diuresis. Consider RAS/2oHTN but defer workup at this time.  - Renal US  - Urine studies: FeNa 3, however has received Lasix  - Foley cath with strict I/O  - Renal Consulted for assistance in management  # Likely OSA: will need OP workup but will provide CPAP qhs  # Obesity: Likely associated metabolic syndrome.  - Monitor for hyperglycemia, SSI for coverage   # Tobacco Abuse: Light use, no evidence of nicotine dependence   # Leukocytosis: resolved and pt remains afebrile, no evidence for infection at this time. Likely stress demargination. UA pending. Continue to montior   --- FEN/GI: Saline Lock, replace K+ and monitor electrolytes, carb modified diet  --- PPx: Heparin Drip  Disposition: Pending clinical improvement.    LOS: 1 day   Monta Police 07/26/2012, 8:13 AM

## 2012-07-26 NOTE — Progress Notes (Signed)
07/26/12    Pharmacy- Heparin 1835  Heparin level 0.18 on 1750 units/ht  37yo female admitted with chest pain and R/O PE.  Heparin level is down again.  Per discussion with RN, a new IV has been placed but after the lab was drawn.  The pump had been beeping some, indicating delivery problems, but not excessively & always addressed quickly.  I expect this issue did have some impact on the heparin level.  In addition, the previous therapeutic level was s/p a bolus as well.  No bleeding problems noted.  1.  Heparin 1500 units IV x 1, increase to 1800 units/hr 2.  Check heparin level in 6 hours  Gracy Bruins, [facility]

## 2012-07-26 NOTE — Progress Notes (Signed)
Placed pt. On CPAP auto titrate min: 4, max: 15 via FFM (pt.'s first time wearing CPAP.)  Pt. Is tolerating CPAP well at this time.

## 2012-07-26 NOTE — Progress Notes (Signed)
ANTICOAGULATION CONSULT NOTE - Follow Up Consult  Pharmacy Consult for heparin Indication: chest pain/ACS + r/o PE  No Known Allergies  Patient Measurements: Height: 5\' 3"  (160 cm) Weight: 290 lb 12.6 oz (131.9 kg) IBW/kg (Calculated) : 52.4 Heparin Dosing Weight: 91.6kg  Vital Signs: Temp: 97.3 F (36.3 C) (02/22 0300) Temp src: Oral (02/22 0300) BP: 131/89 mmHg (02/22 0404) Pulse Rate: 78 (02/22 0404)  Labs:  Recent Labs  07/25/12 1708 07/25/12 2347 07/26/12 0330  HGB 13.1  --  11.4*  HCT 36.3  --  30.9*  PLT 215  --  188  HEPARINUNFRC  --   --  0.16*  CREATININE 4.03*  --   --   TROPONINI  --  <0.30 <0.30    Estimated Creatinine Clearance: 25.2 ml/min (by C-G formula based on Cr of 4.03).   Medical History: Past Medical History  Diagnosis Date  . Hypertension     Medications:  Infusions:  . heparin 1,400 Units/hr (07/25/12 1925)  . labetalol (NORMODYNE) infusion 2 mg/min (07/26/12 0300)    Assessment: 73 yof presented with SOB and CP. Troponin is mildly elevated and d-dimer is elevated as well. Patient is on IV heparin for CP and r/o PE. VQ scan negative for pulmonary embolism.   Heparin level (0.16) is below-goal on 1400 units/hr. No problem with line / infusion and no bleeding per RN.   Goal of Therapy:  Heparin level 0.3-0.7 units/ml Monitor platelets by anticoagulation protocol: Yes   Plan:  1. Heparin IV bolus of 3000 units x 1, then IV infusion of 1750 units/hr.  2. Heparin level in 8 hours.   Otila Back 07/26/2012,4:19 AM

## 2012-07-26 NOTE — Plan of Care (Signed)
Problem: Phase I Progression Outcomes Goal: OOB as tolerated unless otherwise ordered Outcome: Not Progressing Bedrest due to elevated b/p Goal: Voiding-avoid urinary catheter unless indicated Outcome: Not Progressing Foley cath in place for careful uop monitoring Goal: Hemodynamically stable Outcome: Progressing NTG gtt added to labetalol gtt . Goal: Other Phase I Outcomes/Goals Outcome: Not Progressing Still having DOE .Low uop (receiving scheduled lasix IV)---Serum Cr >4.0  Problem: Phase II Progression Outcomes Goal: Progress activity as tolerated unless otherwise ordered Outcome: Not Progressing Bedrest due to b/p and DOE

## 2012-07-26 NOTE — Progress Notes (Signed)
FMTS Attending Daily Note:  Annabell Sabal MD  [phone] pager  Family Practice pager:  [phone] I have seen and examined this patient and have reviewed their chart. I have discussed this patient with the resident. I agree with the resident's findings, assessment and care plan.  See my admit note for details.

## 2012-07-26 NOTE — Progress Notes (Signed)
ANTICOAGULATION CONSULT NOTE - Follow Up Consult  Pharmacy Consult for heparin Indication: chest pain/ACS and r/o PE  No Known Allergies  Patient Measurements: Height: 5\' 3"  (160 cm) Weight: 290 lb 12.6 oz (131.9 kg) IBW/kg (Calculated) : 52.4 Heparin Dosing Weight: 91.6kg  Vital Signs: Temp: 97.3 F (36.3 C) (02/22 0300) Temp src: Oral (02/22 0300) BP: 151/81 mmHg (02/22 1206) Pulse Rate: 73 (02/22 1206)  Labs:  Recent Labs  07/25/12 1708 07/25/12 2347 07/26/12 0330 07/26/12 1050  HGB 13.1  --  11.4*  --   HCT 36.3  --  30.9*  --   PLT 215  --  188  --   HEPARINUNFRC  --   --  0.16* 0.55  CREATININE 4.03*  --  4.12*  --   TROPONINI  --  <0.30 <0.30 <0.30    Estimated Creatinine Clearance: 24.6 ml/min (by C-G formula based on Cr of 4.12).   Assessment: 58 YOF with profound HTN on admission who also had chest pain. Initial troponin was positive, but subseqent troponins have been negative. D-dimer was also positive on admission, with a negative V/Q scan. After 2 repeat boluses and rate increases, heparin level is now therapeutic at 0.55. Noted that heparin level was drawn ~1 hour early. CBC decrease slightly, but no bleeding is noted.  Goal of Therapy:  Heparin level 0.3-0.7 units/ml Monitor platelets by anticoagulation protocol: Yes   Plan:  1. Continue heparin drip at 1750 units/hr 2. 6 hour heparin level to confirm dosing 3. Daily CBC/heparin level 4. Follow for signs of bleeding  Blakeleigh Domek D. Zamire Whitehurst, PharmD Clinical Pharmacist Pager: [phone] 07/26/2012 1:15 PM

## 2012-07-26 NOTE — Progress Notes (Signed)
Patient wore CPAP for about an hour. Said she was being bothered with her nose and sweating. Did want to wear anymore tonight. Will retry again tomorrow night as discussed with patient. sats off are 98% when sleeping it falls when patient is on her right side sleeping but rises with arousal

## 2012-07-26 NOTE — Progress Notes (Signed)
  Echocardiogram 2D Echocardiogram has been performed.  Brittney Contreras 07/26/2012, 10:07 AM

## 2012-07-26 NOTE — Progress Notes (Signed)
[facility]  Subjective: Breathing a lot better.  Objective: Vital signs in last 24 hours: Temp:  [97.3 F (36.3 C)-98.5 F (36.9 C)] 97.3 F (36.3 C) (02/22 0300) Pulse Rate:  [71-120] 73 (02/22 0700) Resp:  [13-23] 23 (02/22 0700) BP: (109-222)/(56-147) 117/63 mmHg (02/22 0700) SpO2:  [92 %-100 %] 93 % (02/22 0700) FiO2 (%):  [21 %] 21 % (02/21 2238) Weight:  [131.9 kg (290 lb 12.6 oz)-132.1 kg (291 lb 3.6 oz)] 131.9 kg (290 lb 12.6 oz) (02/22 0300) Last BM Date: 07/25/12  Intake/Output from previous day: 02/21 0701 - 02/22 0700 In: 452.9 [P.O.:120; I.V.:332.9] Out: 1950 [Urine:1950] Intake/Output this shift:    Medications Current Facility-Administered Medications  Medication Dose Route Frequency Provider Last Rate Last Dose  . 0.9 %  sodium chloride infusion  250 mL Intravenous PRN Gerda Diss, DO      . acetaminophen (TYLENOL) tablet 650 mg  650 mg Oral Q4H PRN Gerda Diss, DO      . aspirin EC tablet 81 mg  81 mg Oral Daily Gerda Diss, DO      . atorvastatin (LIPITOR) tablet 20 mg  20 mg Oral q1800 Gerda Diss, DO      . furosemide (LASIX) injection 80 mg  80 mg Intravenous BID Tarri Fuller, PA   80 mg at 07/26/12 0013  . heparin ADULT infusion 100 units/mL (25000 units/250 mL)  1,750 Units/hr Intravenous Continuous Alveda Reasons, MD 17.5 mL/hr at 07/26/12 0500 1,750 Units/hr at 07/26/12 0500  . hydrALAZINE (APRESOLINE) tablet 25 mg  25 mg Oral Q8H Tarri Fuller, PA   25 mg at 07/26/12 0014  . insulin aspart (novoLOG) injection 0-15 Units  0-15 Units Subcutaneous TID WC Alveda Reasons, MD      . insulin aspart (novoLOG) injection 0-5 Units  0-5 Units Subcutaneous QHS Gerda Diss, DO      . labetalol (NORMODYNE,TRANDATE) 4 mg/mL in dextrose 5 % 125 mL infusion  2 mg/min Intravenous Titrated Tarri Fuller, PA 15 mL/hr at 07/26/12 0400 1 mg/min at 07/26/12 0400  . nitroGLYCERIN (NITROSTAT) SL tablet 0.4 mg  0.4 mg  Sublingual Q5 Min x 3 PRN Gerda Diss, DO      . ondansetron [facility]) injection 4 mg  4 mg Intravenous Q6H PRN Gerda Diss, DO      . potassium chloride SA (K-DUR,KLOR-CON) CR tablet 40 mEq  40 mEq Oral TID Gerda Diss, DO   40 mEq at 07/26/12 [id number]  . sodium chloride 0.9 % injection 3 mL  3 mL Intravenous Q12H Gerda Diss, DO      . sodium chloride 0.9 % injection 3 mL  3 mL Intravenous PRN Gerda Diss, DO        PE: General appearance: alert, cooperative and no distress Lungs: clear to auscultation bilaterally Heart: regular rate and rhythm, S1, S2 normal, no murmur, click, rub or gallop Extremities: Trace LEE Pulses: 2+ and symmetric Skin: Warm and dry Neurologic: Grossly normal  Lab Results:   Recent Labs  07/25/12 1708 07/26/12 0330  WBC 12.1* 9.1  HGB 13.1 11.4*  HCT 36.3 30.9*  PLT 215 188   BMET  Recent Labs  07/25/12 1708 07/26/12 0330  NA 137 138  K 3.6 2.8*  CL 104 106  CO2 18* 17*  GLUCOSE 104* 126*  BUN 33* 34*  CREATININE 4.03* 4.12*  CALCIUM 8.6 7.8*   PT/INR No results  found for this basename: LABPROT, INR,  in the last 72 hours Cholesterol  Recent Labs  07/26/12 0330  CHOL 214*   Lipid Panel     Component Value Date/Time   CHOL 214* 07/26/2012 0330   TRIG 128 07/26/2012 0330   HDL 49 07/26/2012 0330   CHOLHDL 4.4 07/26/2012 0330   VLDL 26 07/26/2012 0330   LDLCALC 139* 07/26/2012 0330   Cardiac Panel (last 3 results)  Recent Labs  07/25/12 2347 07/26/12 0330  TROPONINI <0.30 <0.30   BNP (last 3 results)  Recent Labs  07/25/12 1711  PROBNP 20946.0*    Assessment/Plan  Principal Problem:   Malignant hypertension with congestive heart failure Active Problems:   Congestive heart failure   Acute renal failure   HTN (hypertension)   Obesity, morbid   Sinus tachycardia  Plan:  Breathing better.  No orthopnea.  Net fluids: -1457ml.  BP is a lot better.  Normotensive now on IV labetalol.  Will change to PO.   Repleting K+.  SCr elevated and stable.   Negative PE by VQ scan.   Renal US pending.  Two subsequent troponins negative.   Echo pending.  Recheck BNP in AM.  Continue current Lasix and reevaluate in AM.      LOS: 1 day    HAGER, BRYAN 07/26/2012 7:47 AM   Patient seen and examined. Agree with assessment and plan. Long discussion with pt; she has changed her mind concerning her previous designation of DNR and now wishes to proceed with full code status. BP now 173/156?Marland Kitchen  Will continue with IV labetolol and also add IV NTG. ECG today shows new marked diffuse T wave inversion either due to  LAD ischemia versus diffuse myocardial strain. Cardiac echo this am. K replete. Recommend renal evaluation.   Troy Sine, MD, [facility] 07/26/2012 8:43 AM

## 2012-07-26 NOTE — H&P (Signed)
FMTS Attending Admission Note: Annabell Sabal MD Personal pager:  [phone] FPTS Service Pager:  [phone]  I  have seen and examined this patient, reviewed their chart. I have discussed this patient with the resident. I agree with the resident's findings, assessment and care plan.  Briefly, 37 yo F without significant PMH with 3 weeks increasing dyspnea who presents with 1 day worsening of dyspnea.  Denied any chest pain/palpitations but suddenly became short of breath and EMS called.  Found to be in hypertensive emergency with resultant flash pulmonary edema and what appears to be AKI, although baseline kidney function not known.  Admitted to [place] and started on heparin drip, NTG drip, Labetolol.  + POC troponins but subsequent have been negative.  Inverted T waves found on EKG  Exam:   Gen:  AAF lying in bed, NAD, wearing nasal cannula HEENT:  MMM Neck:  No JVD noted Heart:  RRR, distant Lungs:  Clear Ext:  No edema noted  Imp/Plan: 1.  Malignant hypertension with resultant acute congestive heart failure: - Appreciate cardiology input - BP controlled now with IV NTG, BB, Lasix - AM EKG with new onset T wave inversion and poor R wave progression - concern for anterolateral ischemia.  Echo pending and will further define cardiac output/degree of any damage.  Last troponin Pending.  - Will add-on hepatic function panel as patient started on Lipitor and to assess for any hepatic damage from HTN.  - QTC >500 - avoid prolonging agents.  Repleting K+ already  2.  Renal failure: - Likely chronic, with AKI.  Unknown baseline - Renal US pending - Calling nephro team today for further eval  Other chronic conditions per resident note

## 2012-07-26 NOTE — Consult Note (Signed)
Reason for Consult:malignant HTN, AKI Referring Physician: Mingo Amber, MD  Brittney Contreras is an 37 y.o. female.  HPI: Pt is a 37 yo AAF with PMH sig for obesity, tobacco abuse, and longstanding poorly controlled HTN who presented to [facility] with 3 week h/o SOB, DOE, and SSCP.  Pt was found to have 222/147.  We were asked to see the patient to help evaluate and manage her AKI vs acute recognition of CKD.  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  Trend in Creatinine: Creatinine, Ser  Date/Time Value Range Status  07/26/2012  3:30 AM 4.12* 0.50 - 1.10 mg/dL Final  07/25/2012  5:08 PM 4.03* 0.50 - 1.10 mg/dL Final    PMH:   Past Medical History  Diagnosis Date  . Hypertension     PSH:  History reviewed. No pertinent past surgical history.  Allergies: No Known Allergies  Medications:   Prior to Admission medications   Not on File    Inpatient medications: . aspirin EC  81 mg Oral Daily  . atorvastatin  20 mg Oral q1800  . furosemide  80 mg Intravenous BID  . hydrALAZINE  25 mg Oral Q8H  . insulin aspart  0-15 Units Subcutaneous TID WC  . insulin aspart  0-5 Units Subcutaneous QHS  . potassium chloride  40 mEq Oral TID  . sodium chloride  3 mL Intravenous Q12H    Discontinued Meds:   Medications Discontinued During This Encounter  Medication Reason  . heparin bolus via infusion 4,000 Units Dose change  . hydrALAZINE (APRESOLINE) injection 10 mg   . insulin aspart (novoLOG) injection 0-15 Units   . labetalol (NORMODYNE) tablet 200 mg     Social History:  reports that she has been smoking Cigarettes.  She has been smoking about 0.00 packs per day. She does not have any smokeless tobacco history on file. She reports that  drinks alcohol. She reports that she does not use illicit drugs. she admits to 1/2 pack cigarettes/day for the last 20 years.  Family History:   Family  History  Problem Relation Age of Onset  . Kidney disease      Aunt, deceased, had been on dialysis  . Hypertension Mother   . Heart disease Mother     A comprehensive review of systems was negative except for: Constitutional: positive for fatigue and malaise Respiratory: positive for dyspnea on exertion Cardiovascular: positive for chest pain, dyspnea, exertional chest pressure/discomfort, fatigue and paroxysmal nocturnal dyspnea Gastrointestinal: positive for change in bowel habits Weight change:   Intake/Output Summary (Last 24 hours) at 07/26/12 1140 Last data filed at 07/26/12 1135  Gross per 24 hour  Intake 1056.7 ml  Output   2240 ml  Net -1183.3 ml    General appearance: alert, cooperative and hirsuit obese AAF Head: Normocephalic, without obvious abnormality, atraumatic Neck: no adenopathy, no carotid bruit, no JVD, supple, symmetrical, trachea midline and thyroid not enlarged, symmetric, no tenderness/mass/nodules Resp: diminished breath sounds bibasilar Cardio: faint HS, no rub GI: soft, non-tender; bowel sounds normal; no masses,  no organomegaly Extremities: extremities normal, atraumatic, no cyanosis or edema Neurologic: Grossly normal  Labs: Basic Metabolic Panel:  Recent Labs Lab 07/25/12 1708 07/26/12 0330  NA 137 138  K 3.6 2.8*  CL 104 106  CO2 18* 17*  GLUCOSE 104* 126*  BUN 33* 34*  CREATININE 4.03*  4.12*  CALCIUM 8.6 7.8*   Liver Function Tests: No results found for this basename: AST, ALT, ALKPHOS, BILITOT, PROT, ALBUMIN,  in the last 168 hours No results found for this basename: LIPASE, AMYLASE,  in the last 168 hours No results found for this basename: AMMONIA,  in the last 168 hours CBC:  Recent Labs Lab 07/25/12 1708 07/26/12 0330  WBC 12.1* 9.1  NEUTROABS 9.2*  --   HGB 13.1 11.4*  HCT 36.3 30.9*  MCV 79.3 78.2  PLT 215 188   PT/INR: @labrcntip (inr:5) Cardiac Enzymes:  Recent Labs Lab 07/25/12 2347 07/26/12 0330  07/26/12 1050  TROPONINI <0.30 <0.30 <0.30   CBG:  Recent Labs Lab 07/26/12 0014 07/26/12 0806  GLUCAP 99 102*    Iron Studies: No results found for this basename: IRON, TIBC, TRANSFERRIN, FERRITIN,  in the last 168 hours  Xrays/Other Studies: Dg Chest 2 View  07/25/2012  *RADIOLOGY REPORT*  Clinical Data: Shortness of breath and cough.  CHEST - 2 VIEW  Comparison: No priors.  Findings: Lung volumes are normal.  No acute consolidative air space disease.  Mild diffuse interstitial prominence with some Kerley B lines in the periphery of the lungs.  Cephalization of the pulmonary vasculature.  Borderline cardiomegaly.  Upper mediastinal contours are within normal limits.  No definite pleural effusions.  IMPRESSION: 1.  Borderline cardiomegaly with findings suspicious for mild interstitial pulmonary edema.  Clinical correlation for signs and symptoms of mild congestive heart failure is recommended.   Original Report Authenticated By: Vinnie Langton, M.D.    US Renal  07/26/2012  *RADIOLOGY REPORT*  Clinical Data: New onset renal failure.  RENAL/URINARY TRACT ULTRASOUND COMPLETE  Comparison:  None.  Findings:  Right Kidney:  10.1 cm. No hydronephrosis.  Mildly increased renal echogenicity.  Mild renal cortical thinning for age.  Left Kidney:  10.5 cm. No hydronephrosis.  Mildly increased echogenicity.  Normal renal cortical thickness.  Bladder:  Collapsed around a Foley catheter.  Note is made of bilateral pleural effusions.  IMPRESSION: No hydronephrosis.  Increased renal echogenicity, suggesting medical renal disease.  Bilateral pleural effusions.   Original Report Authenticated By: Abigail Miyamoto, M.D.    Nm Pulmonary Perf And Vent  07/25/2012  *RADIOLOGY REPORT*  Clinical Data:  Shortness of breath.  Rule out pulmonary embolism.  NUCLEAR MEDICINE VENTILATION - PERFUSION LUNG SCAN  Technique:  Ventilation images were obtained in multiple projections using inhaled aerosol technetium 99 M DTPA.   Perfusion images were obtained in multiple projections after intravenous injection of Tc-15m MAA.  Radiopharmaceuticals:  43mCi Tc-54m DTPA aerosol and 6.0 mCi Tc-69m MAA.  Comparison: Plain film earlier today.  Findings:  Ventilation:  No defects identified.  Perfusion:   No wedge shaped peripheral perfusion defects to suggest acute pulmonary embolism  IMPRESSION: No evidence of pulmonary embolism.   Original Report Authenticated By: Abigail Miyamoto, M.D.      Assessment/Plan: 1. Malignant/urgent HTN- markedly improved BP with only a few agents.  Had a lengthy discussion with the patient regarding the need for significant lifestyle changes and need for longterm follow up with a PCP, cardiologist, and nephrologist.  Transition to po meds per primary svc 2. AKI but most likely CKD from longstanding poorly controlled HTN 3. SSCP- V/Q negative for PE 4. CHF- secondary to #1.  Agree with ECHO and Cardiology evaluation 5. T wave inversion- w/u per cardiology. 6. Hypokalemia- will check renin/aldo but likely due to lasix.  Replete and follow 7. Normocytic anemia- likely due  to CKD.  Will check iron stores 8. Obesity- will need diet and weight loss.  Consider nutrition consult 9. Dispo- per primary svc   Valeri Sula A 07/26/2012, 11:40 AM

## 2012-07-27 DIAGNOSIS — Z720 Tobacco use: Secondary | ICD-10-CM | POA: Diagnosis present

## 2012-07-27 HISTORY — DX: Tobacco use: Z72.0

## 2012-07-27 LAB — BASIC METABOLIC PANEL
BUN: 41 mg/dL — ABNORMAL HIGH (ref 6–23)
CO2: 17 mEq/L — ABNORMAL LOW (ref 19–32)
Calcium: 7.8 mg/dL — ABNORMAL LOW (ref 8.4–10.5)
Chloride: 107 mEq/L (ref 96–112)
Creatinine, Ser: 5.22 mg/dL — ABNORMAL HIGH (ref 0.50–1.10)
GFR calc Af Amer: 11 mL/min — ABNORMAL LOW (ref >90)

## 2012-07-27 LAB — CBC
HCT: 28.3 % — ABNORMAL LOW (ref 36.0–46.0)
MCH: 28.8 pg (ref 26.0–34.0)
MCV: 78.4 fL (ref 78.0–100.0)
RDW: 14 % (ref 11.5–15.5)
WBC: 9.9 10*3/uL (ref 4.0–10.5)

## 2012-07-27 LAB — URINE CULTURE
Colony Count: NO GROWTH
Culture: NO GROWTH

## 2012-07-27 LAB — GLUCOSE, CAPILLARY
Glucose-Capillary: 99 mg/dL (ref 70–99)
Glucose-Capillary: 103 mg/dL — ABNORMAL HIGH (ref 70–99)
Glucose-Capillary: 135 mg/dL — ABNORMAL HIGH (ref 70–99)
Glucose-Capillary: 108 mg/dL — ABNORMAL HIGH (ref 70–99)

## 2012-07-27 LAB — VITAMIN B12: Vitamin B-12: 281 pg/mL (ref 211–911)

## 2012-07-27 LAB — T3, FREE: T3, Free: 2.3 pg/mL (ref 2.3–4.2)

## 2012-07-27 MED ORDER — METOPROLOL TARTRATE 100 MG PO TABS
100.0000 mg | ORAL_TABLET | Freq: Two times a day (BID) | ORAL | Status: DC
  Administered 2012-07-27 – 2012-07-28 (×3): 100 mg via ORAL
  Filled 2012-07-27 (×5): qty 1

## 2012-07-27 MED ORDER — METOPROLOL TARTRATE 50 MG PO TABS
50.0000 mg | ORAL_TABLET | Freq: Two times a day (BID) | ORAL | Status: DC
  Administered 2012-07-27: 50 mg via ORAL
  Filled 2012-07-27 (×2): qty 1

## 2012-07-27 MED ORDER — HYDRALAZINE HCL 50 MG PO TABS
50.0000 mg | ORAL_TABLET | Freq: Three times a day (TID) | ORAL | Status: DC
  Administered 2012-07-27 – 2012-07-28 (×4): 50 mg via ORAL
  Filled 2012-07-27 (×6): qty 1

## 2012-07-27 MED ORDER — AMLODIPINE BESYLATE 5 MG PO TABS
5.0000 mg | ORAL_TABLET | Freq: Every day | ORAL | Status: DC
  Administered 2012-07-27: 5 mg via ORAL
  Filled 2012-07-27 (×2): qty 1

## 2012-07-27 MED ORDER — SODIUM BICARBONATE 650 MG PO TABS
650.0000 mg | ORAL_TABLET | Freq: Two times a day (BID) | ORAL | Status: DC
  Administered 2012-07-27 – 2012-08-02 (×12): 650 mg via ORAL
  Filled 2012-07-27 (×14): qty 1

## 2012-07-27 MED ORDER — LEVOTHYROXINE SODIUM 75 MCG PO TABS
75.0000 ug | ORAL_TABLET | Freq: Every day | ORAL | Status: DC
  Administered 2012-07-28 – 2012-08-02 (×5): 75 ug via ORAL
  Filled 2012-07-27 (×7): qty 1

## 2012-07-27 MED ORDER — POTASSIUM CHLORIDE CRYS ER 20 MEQ PO TBCR
40.0000 meq | EXTENDED_RELEASE_TABLET | Freq: Once | ORAL | Status: AC
  Administered 2012-07-27: 40 meq via ORAL
  Filled 2012-07-27: qty 2

## 2012-07-27 MED ORDER — HYDRALAZINE HCL 50 MG PO TABS
50.0000 mg | ORAL_TABLET | Freq: Three times a day (TID) | ORAL | Status: DC
  Filled 2012-07-27 (×3): qty 1

## 2012-07-27 NOTE — Progress Notes (Signed)
Patient ID: Brittney Contreras, female   DOB: [birth year], 37 y.o.   MRN: [id number] S:feels sleepy today O:BP 163/104  Pulse 84  Temp(Src) 97.1 F (36.2 C) (Axillary)  Resp 16  Ht 5\' 3"  (1.6 m)  Wt 131.9 kg (290 lb 12.6 oz)  BMI 51.52 kg/m2  SpO2 98%  LMP 07/18/2012  Intake/Output Summary (Last 24 hours) at 07/27/12 1021 Last data filed at 07/27/12 0935  Gross per 24 hour  Intake 1684.7 ml  Output   2425 ml  Net -740.3 ml   Intake/Output: I/O last 3 completed shifts: In: 2750.1 [P.O.:1140; I.V.:1610.1] Out: [id number] [Urine:4050]  Intake/Output this shift:  Total I/O In: 62.3 [I.V.:62.3] Out: 500 [Urine:500] Weight change:  Gen:WD obese, hirsuit AAF in NAD  CVS:no rub Resp:CTA [id number] Ext:no edema   Recent Labs Lab 07/25/12 1708 07/26/12 0330 07/26/12 1050 07/27/12 0102  NA 137 138  --  136  K 3.6 2.8* 3.0* 3.4*  CL 104 106  --  107  CO2 18* 17*  --  17*  GLUCOSE 104* 126*  --  109*  BUN 33* 34*  --  41*  CREATININE 4.03* 4.12*  --  5.22*  ALBUMIN  --   --  2.8*  --   CALCIUM 8.6 7.8*  --  7.8*  AST  --   --  17  --   ALT  --   --  12  --    Liver Function Tests:  Recent Labs Lab 07/26/12 1050  AST 17  ALT 12  ALKPHOS 64  BILITOT 0.4  PROT 5.9*  ALBUMIN 2.8*   No results found for this basename: LIPASE, AMYLASE,  in the last 168 hours No results found for this basename: AMMONIA,  in the last 168 hours CBC:  Recent Labs Lab 07/25/12 1708 07/26/12 0330 07/27/12 0102  WBC 12.1* 9.1 9.9  NEUTROABS 9.2*  --   --   HGB 13.1 11.4* 10.4*  HCT 36.3 30.9* 28.3*  MCV 79.3 78.2 78.4  PLT 215 188 175   Cardiac Enzymes:  Recent Labs Lab 07/25/12 2347 07/26/12 0330 07/26/12 1050  TROPONINI <0.30 <0.30 <0.30   CBG:  Recent Labs Lab 07/26/12 0806 07/26/12 1219 07/26/12 1620 07/26/12 2144 07/27/12 0749  GLUCAP 102* 109* 98 126* 99    Iron Studies: No results found for this basename: IRON, TIBC, TRANSFERRIN, FERRITIN,  in the last 72  hours Studies/Results: Dg Chest 2 View  07/25/2012  *RADIOLOGY REPORT*  Clinical Data: Shortness of breath and cough.  CHEST - 2 VIEW  Comparison: No priors.  Findings: Lung volumes are normal.  No acute consolidative air space disease.  Mild diffuse interstitial prominence with some Kerley B lines in the periphery of the lungs.  Cephalization of the pulmonary vasculature.  Borderline cardiomegaly.  Upper mediastinal contours are within normal limits.  No definite pleural effusions.  IMPRESSION: 1.  Borderline cardiomegaly with findings suspicious for mild interstitial pulmonary edema.  Clinical correlation for signs and symptoms of mild congestive heart failure is recommended.   Original Report Authenticated By: Vinnie Langton, M.D.    US Renal  07/26/2012  *RADIOLOGY REPORT*  Clinical Data: New onset renal failure.  RENAL/URINARY TRACT ULTRASOUND COMPLETE  Comparison:  None.  Findings:  Right Kidney:  10.1 cm. No hydronephrosis.  Mildly increased renal echogenicity.  Mild renal cortical thinning for age.  Left Kidney:  10.5 cm. No hydronephrosis.  Mildly increased echogenicity.  Normal renal cortical thickness.  Bladder:  Collapsed around a  Foley catheter.  Note is made of bilateral pleural effusions.  IMPRESSION: No hydronephrosis.  Increased renal echogenicity, suggesting medical renal disease.  Bilateral pleural effusions.   Original Report Authenticated By: Abigail Miyamoto, M.D.    Nm Pulmonary Perf And Vent  07/25/2012  *RADIOLOGY REPORT*  Clinical Data:  Shortness of breath.  Rule out pulmonary embolism.  NUCLEAR MEDICINE VENTILATION - PERFUSION LUNG SCAN  Technique:  Ventilation images were obtained in multiple projections using inhaled aerosol technetium 99 M DTPA.  Perfusion images were obtained in multiple projections after intravenous injection of Tc-7m MAA.  Radiopharmaceuticals:  47mCi Tc-104m DTPA aerosol and 6.0 mCi Tc-52m MAA.  Comparison: Plain film earlier today.  Findings:  Ventilation:   No defects identified.  Perfusion:   No wedge shaped peripheral perfusion defects to suggest acute pulmonary embolism  IMPRESSION: No evidence of pulmonary embolism.   Original Report Authenticated By: Abigail Miyamoto, M.D.    . aspirin EC  81 mg Oral Daily  . atorvastatin  20 mg Oral q1800  . hydrALAZINE  50 mg Oral Q8H  . insulin aspart  0-15 Units Subcutaneous TID WC  . insulin aspart  0-5 Units Subcutaneous QHS  . metoprolol tartrate  50 mg Oral BID  . sodium chloride  3 mL Intravenous Q12H    BMET    Component Value Date/Time   NA 136 07/27/2012 0102   K 3.4* 07/27/2012 0102   CL 107 07/27/2012 0102   CO2 17* 07/27/2012 0102   GLUCOSE 109* 07/27/2012 0102   BUN 41* 07/27/2012 0102   CREATININE 5.22* 07/27/2012 0102   CALCIUM 7.8* 07/27/2012 0102   GFRNONAA 10* 07/27/2012 0102   GFRAA 11* 07/27/2012 0102   CBC    Component Value Date/Time   WBC 9.9 07/27/2012 0102   RBC 3.61* 07/27/2012 0102   HGB 10.4* 07/27/2012 0102   HCT 28.3* 07/27/2012 0102   PLT 175 07/27/2012 0102   MCV 78.4 07/27/2012 0102   MCH 28.8 07/27/2012 0102   MCHC 36.7* 07/27/2012 0102   RDW 14.0 07/27/2012 0102   LYMPHSABS 1.9 07/25/2012 1708   MONOABS 0.7 07/25/2012 1708   EOSABS 0.2 07/25/2012 1708   BASOSABS 0.0 07/25/2012 1708    Assessment/Plan:  1. Malignant/urgent HTN- markedly improved BP with only a few agents. Had a lengthy discussion with the patient regarding the need for significant lifestyle changes and need for longterm follow up with a PCP, cardiologist, and nephrologist. Transition to po meds per primary svc.  Would recommend long-acting agents to help improve compliance.  Also need to consider cost.   1. Recommend:  Amlodipine 5mg  daily and titrate to 10mg  as needed.  Would also consider adding a low dose diuretic (lasix 20mg  daily).  Bidil will be too expensive and need to show compliance before giving clonidine. 2. AKI but most likely CKD from longstanding poorly controlled HTN.  Goals of care:  Tight BP  control with goal <130/80, avoid NSAID's/COX-II's/IV contrast, weight loss, smoking cessation.  Pt has already viewed educational videos regarding HD and vascular access.  If no sig improvement in Scr she will likely require AVF placement.  Will order vein mapping to see if she is a candidate for an AVF. 3. Metabolic acidosis- recommend starting sodium bicarb 650mg  2 bid and follow. 4. SSCP- V/Q negative for PE 5. CHF- secondary to #1. Agree with ECHO and Cardiology evaluation 6. T wave inversion- w/u per cardiology. 7. Hypokalemia- will check renin/aldo but likely due to lasix. Replete and  follow per primary svc 8. Normocytic anemia- likely due to CKD. Will check iron stores 9. Hypothyroidism- replacement therapy per primary svc.  10. Obesity- will need diet and weight loss. Consider nutrition consult 11. Dispo- per primary svc 12.   [place] A

## 2012-07-27 NOTE — Progress Notes (Signed)
[facility]  Subjective: Sleep well. Breathing better   Objective: Vital signs in last 24 hours: Temp:  [98.4 F (36.9 C)-99.1 F (37.3 C)] 99.1 F (37.3 C) (02/23 0509) Pulse Rate:  [67-80] 80 (02/23 0700) Resp:  [7-24] 19 (02/23 0700) BP: (108-190)/(64-98) 151/94 mmHg (02/23 0700) SpO2:  [91 %-100 %] 96 % (02/23 0700) Last BM Date: 07/25/12  Intake/Output from previous day: 02/22 0701 - 02/23 0700 In: 2254.7 [P.O.:1020; I.V.:1234.7] Out: 2100 [Urine:2100] Intake/Output this shift:    Medications Current Facility-Administered Medications  Medication Dose Route Frequency Provider Last Rate Last Dose  . 0.9 %  sodium chloride infusion  250 mL Intravenous PRN Gerda Diss, DO      . acetaminophen (TYLENOL) tablet 650 mg  650 mg Oral Q4H PRN Gerda Diss, DO      . aspirin EC tablet 81 mg  81 mg Oral Daily Gerda Diss, DO   81 mg at 07/26/12 [id number]  . atorvastatin (LIPITOR) tablet 20 mg  20 mg Oral q1800 Gerda Diss, DO   20 mg at 07/26/12 1859  . furosemide (LASIX) injection 80 mg  80 mg Intravenous BID Tarri Fuller, PA   80 mg at 07/26/12 1859  . heparin ADULT infusion 100 units/mL (25000 units/250 mL)  1,800 Units/hr Intravenous Continuous Jaquita Folds, PHARMD 18 mL/hr at 07/26/12 2331 1,800 Units/hr at 07/26/12 2331  . hydrALAZINE (APRESOLINE) tablet 25 mg  25 mg Oral Q8H Tarri Fuller, PA   25 mg at 07/27/12 [id number]  . insulin aspart (novoLOG) injection 0-15 Units  0-15 Units Subcutaneous TID WC Alveda Reasons, MD      . insulin aspart (novoLOG) injection 0-5 Units  0-5 Units Subcutaneous QHS Gerda Diss, DO      . labetalol (NORMODYNE,TRANDATE) 4 mg/mL in dextrose 5 % 125 mL infusion  2 mg/min Intravenous Titrated Tarri Fuller, PA 11.3 mL/hr at 07/27/12 0107 0.75 mg/min at 07/27/12 0107  . nitroGLYCERIN (NITROSTAT) SL tablet 0.4 mg  0.4 mg Sublingual Q5 Min x 3 PRN Gerda Diss, DO      . nitroGLYCERIN 0.2 mg/mL in dextrose 5 %  infusion  2-200 mcg/min Intravenous Titrated Tarri Fuller, PA 10.5 mL/hr at 07/27/12 0107 35 mcg/min at 07/27/12 0107  . ondansetron (ZOFRAN) injection 4 mg  4 mg Intravenous Q6H PRN Gerda Diss, DO      . potassium chloride SA (K-DUR,KLOR-CON) CR tablet 40 mEq  40 mEq Oral Once Tarri Fuller, PA      . sodium chloride 0.9 % injection 3 mL  3 mL Intravenous Q12H Gerda Diss, DO   3 mL at 07/26/12 2209  . sodium chloride 0.9 % injection 3 mL  3 mL Intravenous PRN Gerda Diss, DO        PE: General appearance: alert, cooperative and no distress  Lungs: clear to auscultation bilaterally  Heart: regular rate and rhythm, S1, S2 normal, no murmur, click, rub or gallop  Extremities: No LEE  Pulses: 2+ and symmetric  Skin: Warm and dry  Neurologic: Grossly normal   Lab Results:   Recent Labs  07/25/12 1708 07/26/12 0330 07/27/12 0102  WBC 12.1* 9.1 9.9  HGB 13.1 11.4* 10.4*  HCT 36.3 30.9* 28.3*  PLT 215 188 175   BMET  Recent Labs  07/25/12 1708 07/26/12 0330 07/26/12 1050 07/27/12 0102  NA 137 138  --  136  K 3.6 2.8* 3.0* 3.4*  CL  104 106  --  107  CO2 18* 17*  --  17*  GLUCOSE 104* 126*  --  109*  BUN 33* 34*  --  41*  CREATININE 4.03* 4.12*  --  5.22*  CALCIUM 8.6 7.8*  --  7.8*   PT/INR No results found for this basename: LABPROT, INR,  in the last 72 hours Cholesterol  Recent Labs  07/26/12 0330  CHOL 214*   Cardiac Enzymes No components found with this basename: TROPONIN,  CKMB,   Studies/Results: Study Conclusions  - Left ventricle: The cavity size was at the upper limits of normal. Wall thickness was increased in a pattern of severe LVH. There was severe concentric hypertrophy. Systolic function was normal. The estimated ejection fraction was in the range of 50% to 55%. Wall motion was normal; there were no regional wall motion abnormalities. There was a reduced contribution of atrial contraction to ventricular filling, due to increased  ventricular diastolic pressure or atrial contractile dysfunction. Doppler parameters are consistent with a reversible restrictive pattern, indicative of decreased left ventricular diastolic compliance and/or increased left atrial pressure (grade 3 diastolic dysfunction). Doppler parameters are consistent with elevated mean left atrial filling pressure. - Mitral valve: Mild regurgitation. - Left atrium: The atrium was mildly to moderately dilated. - Right ventricle: Systolic pressure was increased. - Atrial septum: No defect or patent foramen ovale was identified. - Pulmonary arteries: PA peak pressure: 65mm Hg (S). Impressions:  - The right ventricular systolic pressure was increased consistent with mild pulmonary hypertension.  RENAL/URINARY TRACT ULTRASOUND COMPLETE  Comparison: None.  Findings:  Right Kidney: 10.1 cm. No hydronephrosis. Mildly increased renal  echogenicity. Mild renal cortical thinning for age.  Left Kidney: 10.5 cm. No hydronephrosis. Mildly increased  echogenicity. Normal renal cortical thickness.  Bladder: Collapsed around a Foley catheter.  Note is made of bilateral pleural effusions.  IMPRESSION:  No hydronephrosis.  Increased renal echogenicity, suggesting medical renal disease.  Bilateral pleural effusions.  Assessment/Plan  Principal Problem:   Malignant hypertension with congestive heart failure Active Problems:   Congestive heart failure   Acute renal failure   HTN (hypertension)   Obesity, morbid   Sinus tachycardia     Plan:   EF [id number], grade 3 diastolic dysfunction.  Severe LVH. There was severe concentric hypertrophy. Consistent with chronic untreated HTN.   Mild pulm HTN.  BNP has improved mildly to 18k.  I increased hydralazine to 50mg  TID.  Will need to transition off IV labetalol.   TWI on EKG. Given renal function an NST would be preferable.  Her size may be problematic.   Worsening SCr.  Now 5.22. Will stop lasix today and  monitor.  She likely will need some PO later.  We discussed daily weight monitoring.  TSH elevated 30.489.  Check Free t4/t3.  Will likely need some Synthroid.  Up to Primary  A1C: 5.2  Nutrition consult ordered.  OP Sleep study needed.       LOS: 2 days    HAGER, BRYAN 07/27/2012 7:58 AM   Patient seen and examined. Agree with assessment and plan. Feels improved. No chest pain. ECG changes are present but less prominent precordially; she remains on iv NTG. Severe LVH with grade 3 diastolic dysfunction on echo.  Will wean and DC labetelol and start lopressor initially at 50 mg bid but may need additional dose titration. Titrate hydralazine to 50 mg every 8 hrs. Ultimately will wean IV NTG and start oral nitrates. Can dc IV lasix.  Needs thyroid replacement; primary care to initiate. Once stable will need myoview scan to assess for ischemia with dynamic ECG changes.   Troy Sine, MD, [facility] 07/27/2012 8:39 AM

## 2012-07-27 NOTE — Progress Notes (Signed)
ANTICOAGULATION CONSULT NOTE Pharmacy Consult for heparin Indication: chest pain/ACS   No Known Allergies  Patient Measurements: Height: 5\' 3"  (160 cm) Weight: 290 lb 12.6 oz (131.9 kg) IBW/kg (Calculated) : 52.4 Heparin Dosing Weight: 91.6kg  Vital Signs: Temp: 98.5 F (36.9 C) (02/22 2330) Temp src: Oral (02/22 2330) BP: 150/66 mmHg (02/23 0200) Pulse Rate: 73 (02/23 0200)  Labs:  Recent Labs  07/25/12 1708 07/25/12 2347  07/26/12 0330 07/26/12 1050 07/26/12 1733 07/27/12 0102  HGB 13.1  --   --  11.4*  --   --  10.4*  HCT 36.3  --   --  30.9*  --   --  28.3*  PLT 215  --   --  188  --   --  175  HEPARINUNFRC  --   --   < > 0.16* 0.55 0.18* 0.34  CREATININE 4.03*  --   --  4.12*  --   --  5.22*  TROPONINI  --  <0.30  --  <0.30 <0.30  --   --   < > = values in this interval not displayed.  Estimated Creatinine Clearance: 19.4 ml/min (by C-G formula based on Cr of 5.22).   Assessment: 37 YO Female with chest pain for heparin  Goal of Therapy:  Heparin level 0.3-0.7 units/ml Monitor platelets by anticoagulation protocol: Yes   Plan:  Continue Heparin at current rate   Phillis Knack, PharmD, BCPS   07/27/2012 2:14 AM

## 2012-07-27 NOTE — Progress Notes (Signed)
FMTS Daily Intern Progress Note: pager: [phone]  Subjective:  No shortness of breath, no chest pain. No n/v.   I have reviewed the patient's medications.  Objective Temp:  [97.1 F (36.2 C)-99.1 F (37.3 C)] 97.1 F (36.2 C) (02/23 0759) Pulse Rate:  [67-84] 84 (02/23 0800) Resp:  [12-24] 16 (02/23 0800) BP: (108-190)/(64-104) 163/104 mmHg (02/23 0800) SpO2:  [91 %-100 %] 98 % (02/23 0800)   Intake/Output Summary (Last 24 hours) at 07/27/12 0943 Last data filed at 07/27/12 0935  Gross per 24 hour  Intake 1979.7 ml  Output   2600 ml  Net -620.3 ml    CBG (last 3)   Recent Labs  07/26/12 1620 07/26/12 2144 07/27/12 0749  GLUCAP 98 126* 99    General: no acute distress, laying in bed, alert and oriented HEENT: moist mucous membranes, PERRLA CV: [id number], RRR, 2/6 systolic murmur best heard at right sternal border  Pulm: CTA b/l, no crackles Abd: soft, non tender, non distended Ext: no edema Neuro: alert and oriented x3  Labs and Imaging  Recent Labs Lab 07/25/12 1708 07/26/12 0330 07/27/12 0102  WBC 12.1* 9.1 9.9  HGB 13.1 11.4* 10.4*  HCT 36.3 30.9* 28.3*  PLT 215 188 175     Recent Labs Lab 07/25/12 1708 07/26/12 0330 07/26/12 1050 07/27/12 0102  NA 137 138  --  136  K 3.6 2.8* 3.0* 3.4*  CL 104 106  --  107  CO2 18* 17*  --  17*  BUN 33* 34*  --  41*  CREATININE 4.03* 4.12*  --  5.22*  GLUCOSE 104* 126*  --  109*  CALCIUM 8.6 7.8*  --  7.8*   Study Conclusions  - Left ventricle: The cavity size was at the upper limits of normal. Wall thickness was increased in a pattern of severe LVH. There was severe concentric hypertrophy. Systolic function was normal. The estimated ejection fraction was in the range of 50% to 55%. Wall motion was normal; there were no regional wall motion abnormalities. There was a reduced contribution of atrial contraction to ventricular filling, due to increased ventricular diastolic pressure or atrial contractile  dysfunction. Doppler parameters are consistent with a reversible restrictive pattern, indicative of decreased left ventricular diastolic compliance and/or increased left atrial pressure (grade 3 diastolic dysfunction). Doppler parameters are consistent with elevated mean left atrial filling pressure. - Mitral valve: Mild regurgitation. - Left atrium: The atrium was mildly to moderately dilated. - Right ventricle: Systolic pressure was increased. - Atrial septum: No defect or patent foramen ovale was identified. - Pulmonary arteries: PA peak pressure: 82mm Hg (S). Impressions:  - The right ventricular systolic pressure was increased consistent with mild pulmonary hypertension.  RENAL/URINARY TRACT ULTRASOUND COMPLETE  Comparison: None.  Findings:  Right Kidney: 10.1 cm. No hydronephrosis. Mildly increased renal  echogenicity. Mild renal cortical thinning for age.  Left Kidney: 10.5 cm. No hydronephrosis. Mildly increased  echogenicity. Normal renal cortical thickness.  Bladder: Collapsed around a Foley catheter.  Note is made of bilateral pleural effusions.  IMPRESSION:  No hydronephrosis.  Increased renal echogenicity, suggesting medical renal disease.  Bilateral pleural effusions.    Assessment and Plan 37 yo female who presented with hypertensive emergency, elevated BNP, CHF and acute likely on chronic renal failure.  # Hypertensive emergency: BP improved from admission on hydralazine, labetolol drip and nitro drip. EKG changes with ST inversion in lateral leads present but stable compared to previous.    - per cards:  wean off labetalol drip and transition to metoprolol 50 bid.  - hydralazine 50mg  tid. - wean off IV NTG and start oral nitrates.  -  On heparin drip for concern for ACS.  - plan for myoview once stable, per cardiology.   # CHF: Echo with EF: 50-55% with grade 3 diastolic dysfunction and severe LVH and mild pulmonary HTN. BNP mildly improved from yesterday  at 18,000.  Overall negative fluid balance since admission. Only slightly positive today at 154cc. No evidence of fluid overload. Dyspnea improving.  - continue beta blocker and BP control - lasix d/ced this AM by cardiology due to worsening renal function.   # Acute renal failure, likely on chronic: followed by nephrology. Creatinine acutely worst today from 4.12 to 5.22. -  Lasix d/ced. Monitor urine output and Cr. Avoid nephrotoxic agents.  - will f/u nephrology recs.   # Hypokalemia: in setting of lasix use:  - received KDur 40mg  this am - monitor BMP tomorrow.  - follow up renin/aldo  # non anionic gap acidosis: stable from previous: likely from renal failure.  - continue to monitor BMP.  - consider bicarb   # Hypothyroid: elevated TSH at 30.4. Could be from transient elevation from non-thyroid illness in a hospitalized patient, however with level being above 20, this could likely be from true hypothyroid.  - retest after acute illness - f/u T3, T4  FEN: heart healthy PPx: heparin drip Dispo: pending improvement.   Liam Graham, PGY-2 Family Medicine Resident

## 2012-07-27 NOTE — Progress Notes (Signed)
Pt watching ( first of 4 kidney) failure video .

## 2012-07-27 NOTE — Progress Notes (Signed)
Pt watching second  hemodialysis video

## 2012-07-27 NOTE — Progress Notes (Signed)
Pt had taken herself on CPAP. Pt SpO2 currently 94 and no complications.

## 2012-07-27 NOTE — Progress Notes (Signed)
FMTS Attending Daily Note:  Annabell Sabal MD  [phone] pager  Family Practice pager:  [phone] I have seen and examined this patient and have reviewed their chart. I have discussed this patient with the resident. I agree with the resident's findings, assessment and care plan.  Additionally: - Pt without chest pain today.  Frustrated and scared over the changes that are happening and perceived lack of control over her situation.    Imp/Plan: 1.  HTN:   - Greatly appreciate cardiology and renal input.  Agree with transition to metoprolol.  Agree that Bidil less of an option long-term secondary to finances.   - Agree with weaning NTG - Start Norvasc 5 mg today.  Once off IV anti-hypertensives, plan to increase Norvasc to 10 mg with hope to titrate off hydralazine due to TID dosing and long-term cost.  - Defer diuretic usage to renal due to worsening AKI  2.  Concern for ACS: - Still with TWI.  - Troponins negative - Question if needs to remain on heparin, defer to cards  3.  CHF:   - BNP with mild improvement.   - Balance between AKI and need for diuresis.   - Low dose lasix hopefuly will help with some of this.   4.  AKI: - Appreciate renal rec's. - Vein mapping for today - Good UOP so far.  Hopeful to avoid dialysis.  5.  Acidosis, metabolic: - Bicarb [id number] BID starting today  6.  Hypothyrodism: - Will need to recheck TSH once acute issues have resolved - Starting 75 mcg today.    7.  Depression: - Overwhelmed with everything currently going on. - States she has good family support - Consider SSRI.

## 2012-07-28 ENCOUNTER — Encounter (HOSPITAL_COMMUNITY): Payer: Self-pay | Admitting: Cardiology

## 2012-07-28 DIAGNOSIS — N186 End stage renal disease: Secondary | ICD-10-CM

## 2012-07-28 DIAGNOSIS — I119 Hypertensive heart disease without heart failure: Secondary | ICD-10-CM | POA: Diagnosis present

## 2012-07-28 DIAGNOSIS — N19 Unspecified kidney failure: Secondary | ICD-10-CM

## 2012-07-28 HISTORY — DX: Hypertensive heart disease without heart failure: I11.9

## 2012-07-28 LAB — BASIC METABOLIC PANEL
CO2: 16 mEq/L — ABNORMAL LOW (ref 19–32)
Calcium: 8.3 mg/dL — ABNORMAL LOW (ref 8.4–10.5)
Chloride: 110 mEq/L (ref 96–112)
Creatinine, Ser: 5.04 mg/dL — ABNORMAL HIGH (ref 0.50–1.10)
GFR calc Af Amer: 12 mL/min — ABNORMAL LOW (ref >90)
Sodium: 138 mEq/L (ref 135–145)

## 2012-07-28 LAB — GLUCOSE, CAPILLARY: Glucose-Capillary: 93 mg/dL (ref 70–99)

## 2012-07-28 LAB — FOLATE RBC: RBC Folate: 547 ng/mL (ref >=366)

## 2012-07-28 LAB — CBC
MCH: 28.2 pg (ref 26.0–34.0)
Platelets: 186 10*3/uL (ref 150–400)
RBC: 3.73 MIL/uL — ABNORMAL LOW (ref 3.87–5.11)
RDW: 14.4 % (ref 11.5–15.5)
WBC: 8.5 10*3/uL (ref 4.0–10.5)

## 2012-07-28 MED ORDER — HEPARIN SODIUM (PORCINE) 5000 UNIT/ML IJ SOLN
5000.0000 [IU] | Freq: Three times a day (TID) | INTRAMUSCULAR | Status: DC
  Administered 2012-07-28 – 2012-08-02 (×7): 5000 [IU] via SUBCUTANEOUS
  Filled 2012-07-28 (×17): qty 1

## 2012-07-28 MED ORDER — AMLODIPINE BESYLATE 10 MG PO TABS
10.0000 mg | ORAL_TABLET | Freq: Every day | ORAL | Status: DC
  Filled 2012-07-28: qty 1

## 2012-07-28 MED ORDER — AMLODIPINE BESYLATE 10 MG PO TABS
10.0000 mg | ORAL_TABLET | Freq: Every day | ORAL | Status: DC
  Administered 2012-07-28 – 2012-08-01 (×5): 10 mg via ORAL
  Filled 2012-07-28 (×6): qty 1

## 2012-07-28 MED ORDER — FUROSEMIDE 80 MG PO TABS
80.0000 mg | ORAL_TABLET | Freq: Two times a day (BID) | ORAL | Status: DC
  Administered 2012-07-28 – 2012-07-31 (×7): 80 mg via ORAL
  Filled 2012-07-28 (×10): qty 1

## 2012-07-28 MED ORDER — SODIUM CHLORIDE 0.9 % IV SOLN
1020.0000 mg | Freq: Once | INTRAVENOUS | Status: AC
  Administered 2012-07-28: 1020 mg via INTRAVENOUS
  Filled 2012-07-28: qty 34

## 2012-07-28 MED ORDER — FUROSEMIDE 80 MG PO TABS: 80.0000 mg | ORAL_TABLET | Freq: Every day | ORAL | Status: DC

## 2012-07-28 MED ORDER — HYDRALAZINE HCL 50 MG PO TABS
100.0000 mg | ORAL_TABLET | Freq: Three times a day (TID) | ORAL | Status: DC
  Administered 2012-07-28 – 2012-08-01 (×11): 100 mg via ORAL
  Filled 2012-07-28 (×13): qty 2

## 2012-07-28 NOTE — Progress Notes (Signed)
FMTS Daily Intern Progress Note: pager: [phone]  Subjective:  No shortness of breath, no chest pain. No n/v.   I have reviewed the patient's medications.  Objective Temp:  [97.9 F (36.6 C)-98.9 F (37.2 C)] 97.9 F (36.6 C) (02/24 0740) Pulse Rate:  [71-83] 79 (02/24 0604) Resp:  [0-24] 19 (02/24 0604) BP: (116-167)/(61-100) 151/91 mmHg (02/24 0604) SpO2:  [94 %-100 %] 95 % (02/24 0604)   Intake/Output Summary (Last 24 hours) at 07/28/12 0818 Last data filed at 07/28/12 0600  Gross per 24 hour  Intake 2565.6 ml  Output   2235 ml  Net  330.6 ml    CBG (last 3)   Recent Labs  07/27/12 1733 07/27/12 2113 07/28/12 0738  GLUCAP 135* 108* 91    General: no acute distress, laying in bed, alert and oriented HEENT: moist mucous membranes, PERRLA CV: [id number], RRR, 2/6 systolic murmur best heard at right sternal border  Pulm: CTA b/l, no crackles Abd: soft, non tender, non distended Ext: no edema Neuro: alert and oriented x3  Labs and Imaging  Recent Labs Lab 07/26/12 0330 07/27/12 0102 07/28/12 0525  WBC 9.1 9.9 8.5  HGB 11.4* 10.4* 10.5*  HCT 30.9* 28.3* 29.6*  PLT 188 175 186     Recent Labs Lab 07/26/12 0330 07/26/12 1050 07/27/12 0102 07/28/12 0525  NA 138  --  136 138  K 2.8* 3.0* 3.4* 3.9  CL 106  --  107 110  CO2 17*  --  17* 16*  BUN 34*  --  41* 38*  CREATININE 4.12*  --  5.22* 5.04*  GLUCOSE 126*  --  109* 92  CALCIUM 7.8*  --  7.8* 8.3*   Study Conclusions  - Left ventricle: The cavity size was at the upper limits of normal. Wall thickness was increased in a pattern of severe LVH. There was severe concentric hypertrophy. Systolic function was normal. The estimated ejection fraction was in the range of 50% to 55%. Wall motion was normal; there were no regional wall motion abnormalities. There was a reduced contribution of atrial contraction to ventricular filling, due to increased ventricular diastolic pressure or atrial contractile  dysfunction. Doppler parameters are consistent with a reversible restrictive pattern, indicative of decreased left ventricular diastolic compliance and/or increased left atrial pressure (grade 3 diastolic dysfunction). Doppler parameters are consistent with elevated mean left atrial filling pressure. - Mitral valve: Mild regurgitation. - Left atrium: The atrium was mildly to moderately dilated. - Right ventricle: Systolic pressure was increased. - Atrial septum: No defect or patent foramen ovale was identified. - Pulmonary arteries: PA peak pressure: 39mm Hg (S). Impressions:  - The right ventricular systolic pressure was increased consistent with mild pulmonary hypertension.  RENAL/URINARY TRACT ULTRASOUND COMPLETE  Comparison: None.  Findings:  Right Kidney: 10.1 cm. No hydronephrosis. Mildly increased renal  echogenicity. Mild renal cortical thinning for age.  Left Kidney: 10.5 cm. No hydronephrosis. Mildly increased  echogenicity. Normal renal cortical thickness.  Bladder: Collapsed around a Foley catheter.  Note is made of bilateral pleural effusions.  IMPRESSION:  No hydronephrosis.  Increased renal echogenicity, suggesting medical renal disease.  Bilateral pleural effusions.    Assessment and Plan 37 yo female who presented with hypertensive emergency, elevated BNP, CHF and acute likely on chronic renal failure.  # Hypertensive emergency: BP improved from admission on hydralazine, labetolol drip and nitro drip. EKG changes with ST inversion in lateral leads present but stable compared to previous.  Cardiology and Renal both  consulted and greatly appreciate their recommendations.     - per cards: wean off labetalol drip and transition to metoprolol 50 bid.  - hydralazine 50mg  tid. - wean off IV NTG and start oral nitrates.  -  On heparin drip for concern for ACS.  - plan for myoview once stable, per cardiology. - Will increase Norvasc from 5 to 10 mg today -Also will  add back on Lasix when creatinine stabilized   # CHF: Echo with EF: 50-55% with grade 3 diastolic dysfunction and severe LVH and mild pulmonary HTN. BNP mildly improved from yesterday at 18,000.  Overall negative fluid balance since admission. Net output of 114.6 yesterday. No evidence of fluid overload. Dyspnea improving.  - continue beta blocker and BP control - lasix d/ced by cardiology due to worsening renal function.   # Acute renal failure, likely on chronic: followed by nephrology. Creatinine 4.12 to 5.22 to 5.014 today -  Lasix d/ced. Monitor urine output and Cr. Avoid nephrotoxic agents.  - will f/u nephrology recs.   # Hypokalemia: in setting of lasix use:  - 3.9 this AM - monitor BMP tomorrow.  - follow up renin/aldo (pending), Cortisol 9.6  # non anionic gap acidosis: stable from previous: likely from renal failure.  - continue to monitor BMP.  - Bicarb started yesterday   # Hypothyroid: elevated TSH at 30.4. Could be from transient elevation from non-thyroid illness in a hospitalized patient, however with level being above 20, this could likely be from true hypothyroid.  - retest after acute illness - Free T4 decreased to 0.76, Free T3 2.3  #Microcytic Anemia - Iron panel consistent with anemia of chronic disease  1) Will continue to monitor   FEN: heart healthy PPx: heparin drip Dispo: pending improvement.   Tamela Oddi Awanda Mink, DO of [facility] 07/28/2012, 8:28 AM

## 2012-07-28 NOTE — Consult Note (Signed)
[facility] [place] CONSULT NOTE 07/28/2012 DOB: [id number] MRN : [id number]  CC: Needs hemodialysis access Referring Physician:James L Deterding, MD   History of Present Illness: This female was admitted to the hospital 07/25/2012 with 3 weeks increasing dyspnea who presents with 1 day worsening of dyspnea and chest pain.  He has a history of untreated hypertension.  She is a smoker and has no cardiac history to date.  We are asked to place a long term hemodialysis access.  She is currently not on dialysis.  She is currently on nitroglycerin, heparin and labetalol drips which are being weaned.  She is right handed.   Past Medical History  Diagnosis Date  . Hypertension   . Hypertensive hypertrophic cardiomyopathy 07/28/2012  . Diastolic heart failure: Grade 3  07/25/2012  . Obesity, morbid 07/25/2012  . Malignant hypertension with congestive heart failure 07/25/2012  . Tobacco abuse 07/27/2012    History reviewed. No pertinent past surgical history.   ROS: [x]  Positive  [ ]  Denies    General: [ ]  Weight loss, [ ]  Fever, [ ]  chills Neurologic: [ ]  Dizziness, [ ]  Blackouts, [ ]  Seizure [ ]  Stroke, [ ]  "Mini stroke", [ ]  Slurred speech, [ ]  Temporary blindness; [ ]  weakness in arms or legs, [ ]  Hoarseness Cardiac: [ ]  Chest pain/pressure, [ ]  Shortness of breath at rest [ x] Shortness of breath with exertion, [ ]  Atrial fibrillation or irregular heartbeat Urinary: [x ] chronic Kidney disease, [ ]  on HD - [ ]  MWF or [ ]  TTHS, [ ]  Burning with urination, [ ]  Difficulty urinating Skin: [ ]  Rashes, [ ]  Wounds  Social History History  Substance Use Topics  . Smoking status: Current Some Day Smoker    Types: Cigarettes  . Smokeless tobacco: Not on file  . Alcohol Use: Yes     Comment: occ    Family History Family History  Problem Relation Age of Onset  . Kidney disease      Aunt, deceased, had been on dialysis  . Hypertension Mother   . Heart disease Mother      No Known Allergies  Current Facility-Administered Medications  Medication Dose Route Frequency Provider Last Rate Last Dose  . 0.9 %  sodium chloride infusion  250 mL Intravenous PRN Gerda Diss, DO      . acetaminophen (TYLENOL) tablet 650 mg  650 mg Oral Q4H PRN Gerda Diss, DO      . amLODipine (NORVASC) tablet 10 mg  10 mg Oral QHS Placido Sou, MD      . aspirin EC tablet 81 mg  81 mg Oral Daily Gerda Diss, DO   81 mg at 07/28/12 [id number]  . atorvastatin (LIPITOR) tablet 20 mg  20 mg Oral q1800 Gerda Diss, DO   20 mg at 07/27/12 1832  . furosemide (LASIX) tablet 80 mg  80 mg Oral BID Placido Sou, MD   80 mg at 07/28/12 0959  . heparin ADULT infusion 100 units/mL (25000 units/250 mL)  1,800 Units/hr Intravenous Continuous Kendra P Hiatt, PHARMD 18 mL/hr at 07/28/12 0700 1,800 Units/hr at 07/28/12 0700  . hydrALAZINE (APRESOLINE) tablet 50 mg  50 mg Oral Q8H Tarri Fuller, PA   50 mg at 07/28/12 0505  . insulin aspart (novoLOG) injection 0-15 Units  0-15 Units Subcutaneous TID WC Alveda Reasons, MD      . insulin aspart (novoLOG) injection 0-5 Units  0-5 Units Subcutaneous  QHS Gerda Diss, DO      . labetalol (NORMODYNE,TRANDATE) 4 mg/mL in dextrose 5 % 125 mL infusion  2 mg/min Intravenous Titrated Tarri Fuller, PA 11.3 mL/hr at 07/28/12 0700 0.75 mg/min at 07/28/12 0700  . levothyroxine (SYNTHROID, LEVOTHROID) tablet 75 mcg  75 mcg Oral QAC breakfast Alveda Reasons, MD   75 mcg at 07/28/12 [id number]  . metoprolol (LOPRESSOR) tablet 100 mg  100 mg Oral BID Kandis Nab, MD   100 mg at 07/28/12 0959  . nitroGLYCERIN (NITROSTAT) SL tablet 0.4 mg  0.4 mg Sublingual Q5 Min x 3 PRN Gerda Diss, DO      . nitroGLYCERIN 0.2 mg/mL in dextrose 5 % infusion  2-200 mcg/min Intravenous Titrated Tarri Fuller, PA 9 mL/hr at 07/28/12 0900 30 mcg/min at 07/28/12 0900  . ondansetron (ZOFRAN) injection 4 mg  4 mg Intravenous Q6H PRN Gerda Diss, DO      . sodium  bicarbonate tablet 650 mg  650 mg Oral BID Alveda Reasons, MD   650 mg at 07/28/12 0959  . sodium chloride 0.9 % injection 3 mL  3 mL Intravenous Q12H Gerda Diss, DO   3 mL at 07/28/12 0959  . sodium chloride 0.9 % injection 3 mL  3 mL Intravenous PRN Gerda Diss, DO         Imaging: No results found.  Significant Diagnostic Studies: CBC Lab Results  Component Value Date   WBC 8.5 07/28/2012   HGB 10.5* 07/28/2012   HCT 29.6* 07/28/2012   MCV 79.4 07/28/2012   PLT 186 07/28/2012    BMET    Component Value Date/Time   NA 138 07/28/2012 0525   K 3.9 07/28/2012 0525   CL 110 07/28/2012 0525   CO2 16* 07/28/2012 0525   GLUCOSE 92 07/28/2012 0525   BUN 38* 07/28/2012 0525   CREATININE 5.04* 07/28/2012 0525   CALCIUM 8.3* 07/28/2012 0525   GFRNONAA 10* 07/28/2012 0525   GFRAA 12* 07/28/2012 0525    COAG No results found for this basename: INR, PROTIME   No results found for this basename: PTT     Physical Examination BP Readings from Last 3 Encounters:  07/28/12 138/78   Temp Readings from Last 3 Encounters:  07/28/12 97.9 F (36.6 C) Oral   SpO2 Readings from Last 3 Encounters:  07/28/12 99%   Pulse Readings from Last 3 Encounters:  07/28/12 80    General:  WDWN in NAD HENT: WNL Eyes: Pupils equal Pulmonary: normal non-labored breathing Cardiac: RRR Abdomen: soft, NT, no masses, obese Skin: no rashes, ulcers noted Vascular Exam/Pulses:2+ brachial radial DP pulses bilat Extremities without ischemic changes, no Gangrene , no cellulitis; no open wounds; bilateral antecubital IVs Musculoskeletal: no muscle wasting or atrophy  Neurologic: A&O X 3 SENSATION: normal;  Non-Invasive Vascular Imaging: pending  ASSESSMENT/PLAN: Needs hemodialysis access.  She is currently on a heparin drip and requiring IV meds to control BP.  Vein mapping pending.  Most likely will place access in the left, non dominant arm.  Will need to be more stable overall with BP controlled  with PO meds and off heparin prior to this elective procedure.  Will follow.  Ruta Hinds, MD [facility] [place] Office: [phone] Pager: [phone]

## 2012-07-28 NOTE — Progress Notes (Signed)
Subjective: Interval History: none.  Objective: Vital signs in last 24 hours: Temp:  [97.9 F (36.6 C)-98.9 F (37.2 C)] 97.9 F (36.6 C) (02/24 0740) Pulse Rate:  [71-83] 79 (02/24 0604) Resp:  [0-24] 19 (02/24 0604) BP: (116-167)/(61-100) 151/91 mmHg (02/24 0604) SpO2:  [94 %-100 %] 95 % (02/24 0604) Weight change:   Intake/Output from previous day: 02/23 0701 - 02/24 0700 In: 2620.4 [P.O.:1380; I.V.:1240.4] Out: 2735 [Urine:2735] Intake/Output this shift:    General appearance: cooperative and morbidly obese Resp: diminished breath sounds bilaterally and rales bibasilar Cardio: regular rate and rhythm, S4 present and systolic murmur: holosystolic 2/6, blowing at apex GI: obese, pos bs,liver down 6 cm Extremities: edema 1+  Lab Results:  Recent Labs  07/27/12 0102 07/28/12 0525  WBC 9.9 8.5  HGB 10.4* 10.5*  HCT 28.3* 29.6*  PLT 175 186   BMET:  Recent Labs  07/27/12 0102 07/28/12 0525  NA 136 138  K 3.4* 3.9  CL 107 110  CO2 17* 16*  GLUCOSE 109* 92  BUN 41* 38*  CREATININE 5.22* 5.04*  CALCIUM 7.8* 8.3*   No results found for this basename: PTH,  in the last 72 hours Iron Studies:  Recent Labs  07/27/12 1042  IRON 37*  TIBC 230*  FERRITIN 167    Studies/Results: US Renal  07/26/2012  *RADIOLOGY REPORT*  Clinical Data: New onset renal failure.  RENAL/URINARY TRACT ULTRASOUND COMPLETE  Comparison:  None.  Findings:  Right Kidney:  10.1 cm. No hydronephrosis.  Mildly increased renal echogenicity.  Mild renal cortical thinning for age.  Left Kidney:  10.5 cm. No hydronephrosis.  Mildly increased echogenicity.  Normal renal cortical thickness.  Bladder:  Collapsed around a Foley catheter.  Note is made of bilateral pleural effusions.  IMPRESSION: No hydronephrosis.  Increased renal echogenicity, suggesting medical renal disease.  Bilateral pleural effusions.   Original Report Authenticated By: Abigail Miyamoto, M.D.     I have reviewed the patient's  current medications.  Assessment/Plan: 1 CKD 5  Will need access, diet, and education cont bicarb 2 Anemia check Fe, 3 HPTH 4 Obesity complic all tx 5 Smoking needs to d/c 6 HTN will use bid Lasix  P Map, access, Lasix, check fe/PTH   LOS: 3 days   Angeline Trick L 07/28/2012,8:56 AM

## 2012-07-28 NOTE — Progress Notes (Signed)
Pt does not wish to wear CPAP tonight. Pt attempted to wear on previous night and did not tolerate well. Pt stated that she was uncomfortable. Pt encouraged to call RT if pt changes mind. No distress noted.

## 2012-07-28 NOTE — Progress Notes (Addendum)
DC foley per MD order and pt request.  Pt instructed to call for nurse before getting out of bed heavily enforced, bed alarm on.  Bed low, brakes on, call light within reach. Will continue to monitor closely and update as needed.    Clinical cytogeneticist, SN   Agree with note, Adella Hare, RN

## 2012-07-28 NOTE — Progress Notes (Signed)
ANTICOAGULATION CONSULT NOTE - Follow Up Consult  Pharmacy Consult for heparin Indication: chest pain/ACS  No Known Allergies  Patient Measurements: Height: 5\' 3"  (160 cm) Weight: 290 lb 12.6 oz (131.9 kg) IBW/kg (Calculated) : 52.4 Heparin Dosing Weight: 91.6kg  Vital Signs: Temp: 97.9 F (36.6 C) (02/24 0740) Temp src: Oral (02/24 0740) BP: 138/78 mmHg (02/24 0900) Pulse Rate: 80 (02/24 0900)  Labs:  Recent Labs  07/25/12 2347  07/26/12 0330 07/26/12 1050 07/26/12 1733 07/27/12 0102 07/28/12 0525  HGB  --   --  11.4*  --   --  10.4* 10.5*  HCT  --   --  30.9*  --   --  28.3* 29.6*  PLT  --   --  188  --   --  175 186  HEPARINUNFRC  --   < > 0.16* 0.55 0.18* 0.34 0.33  CREATININE  --   --  4.12*  --   --  5.22* 5.04*  TROPONINI <0.30  --  <0.30 <0.30  --   --   --   < > = values in this interval not displayed.  Estimated Creatinine Clearance: 20.1 ml/min (by C-G formula based on Cr of 5.04).   Assessment: 14 yoF continues on heparin per pharmacy for chest pain/ACS. Pt denies continued CP and cardiac enzymes are (-) x 3. Heparin is to continue through tomorrow AM per discussion with Dr. Ellyn Hack.  Heparin level this AM in therapeutic range at 0.33 on 1800 units/h. CBC is stable. No bleeding or infusion line issues noted.  Goal of Therapy:  Heparin level 0.3-0.7 units/ml Monitor platelets by anticoagulation protocol: Yes   Plan:  - continue heparin gtt at 1800 units/h - daily HL, CBC - monitor for s/sx bleeding - f/u heparin plans tomorrow am  Thank you for the consult.  Johny Drilling, PharmD Clinical Pharmacist Pager: [phone] Pharmacy: [phone] 07/28/2012 10:22 AM

## 2012-07-28 NOTE — Progress Notes (Signed)
Pt does not wish to wear CPAP at this time. Machine removed from room. Pt encouraged to call RT if pt changes mind. No distress noted.

## 2012-07-28 NOTE — Progress Notes (Addendum)
[facility]  Subjective: Feels sleepy.  No longer SOB.  No CP.  No HA  Objective: Vital signs in last 24 hours: Temp:  [97.9 F (36.6 C)-98.9 F (37.2 C)] 97.9 F (36.6 C) (02/24 0740) Pulse Rate:  [71-83] 79 (02/24 0604) Resp:  [0-24] 19 (02/24 0604) BP: (116-167)/(61-100) 151/91 mmHg (02/24 0604) SpO2:  [94 %-100 %] 95 % (02/24 0604) Last BM Date: 07/25/12  Intake/Output from previous day: 02/23 0701 - 02/24 0700 In: 2620.4 [P.O.:1380; I.V.:1240.4] Out: 2735 [Urine:2735] Intake/Output this shift:    Medications Current Facility-Administered Medications  Medication Dose Route Frequency Provider Last Rate Last Dose  . 0.9 %  sodium chloride infusion  250 mL Intravenous PRN Gerda Diss, DO      . acetaminophen (TYLENOL) tablet 650 mg  650 mg Oral Q4H PRN Gerda Diss, DO      . amLODipine (NORVASC) tablet 10 mg  10 mg Oral Daily Bryan R Hess, DO      . aspirin EC tablet 81 mg  81 mg Oral Daily Gerda Diss, DO   81 mg at 07/27/12 [id number]  . atorvastatin (LIPITOR) tablet 20 mg  20 mg Oral q1800 Gerda Diss, DO   20 mg at 07/27/12 1832  . heparin ADULT infusion 100 units/mL (25000 units/250 mL)  1,800 Units/hr Intravenous Continuous Jaquita Folds, PHARMD 18 mL/hr at 07/28/12 0543 1,800 Units/hr at 07/28/12 0543  . hydrALAZINE (APRESOLINE) tablet 50 mg  50 mg Oral Q8H Tarri Fuller, PA   50 mg at 07/28/12 0505  . insulin aspart (novoLOG) injection 0-15 Units  0-15 Units Subcutaneous TID WC Alveda Reasons, MD      . insulin aspart (novoLOG) injection 0-5 Units  0-5 Units Subcutaneous QHS Gerda Diss, DO      . labetalol (NORMODYNE,TRANDATE) 4 mg/mL in dextrose 5 % 125 mL infusion  2 mg/min Intravenous Titrated Tarri Fuller, PA 11.3 mL/hr at 07/27/12 1949 0.75 mg/min at 07/27/12 1949  . levothyroxine (SYNTHROID, LEVOTHROID) tablet 75 mcg  75 mcg Oral QAC breakfast Alveda Reasons, MD   75 mcg at 07/28/12 [id number]  . metoprolol (LOPRESSOR)  tablet 100 mg  100 mg Oral BID Kandis Nab, MD   100 mg at 07/27/12 1949  . nitroGLYCERIN (NITROSTAT) SL tablet 0.4 mg  0.4 mg Sublingual Q5 Min x 3 PRN Gerda Diss, DO      . nitroGLYCERIN 0.2 mg/mL in dextrose 5 % infusion  2-200 mcg/min Intravenous Titrated Tarri Fuller, PA 10.5 mL/hr at 07/28/12 0347 35 mcg/min at 07/28/12 0347  . ondansetron (ZOFRAN) injection 4 mg  4 mg Intravenous Q6H PRN Gerda Diss, DO      . sodium bicarbonate tablet 650 mg  650 mg Oral BID Alveda Reasons, MD   650 mg at 07/27/12 2114  . sodium chloride 0.9 % injection 3 mL  3 mL Intravenous Q12H Gerda Diss, DO   3 mL at 07/27/12 0941  . sodium chloride 0.9 % injection 3 mL  3 mL Intravenous PRN Gerda Diss, DO        PE: General appearance: alert, cooperative, no distress and morbidly obese Neck: no carotid bruit and unable to assess JVP due to body habitus Lungs: clear to auscultation bilaterally, normal percussion bilaterally and non-labored Heart: regular rate and rhythm, S1, S2 normal, S4 present, no rub and no murmur Abdomen: obese, soft/NT/ND/NABS Extremities: extremities normal, atraumatic, no cyanosis or edema  Pulses: 2+ and symmetric Neurologic: Grossly normal; Seems depressed  Lab Results:   Recent Labs  07/26/12 0330 07/27/12 0102 07/28/12 0525  WBC 9.1 9.9 8.5  HGB 11.4* 10.4* 10.5*  HCT 30.9* 28.3* 29.6*  PLT 188 175 186   BMET  Recent Labs  07/26/12 0330 07/26/12 1050 07/27/12 0102 07/28/12 0525  NA 138  --  136 138  K 2.8* 3.0* 3.4* 3.9  CL 106  --  107 110  CO2 17*  --  17* 16*  GLUCOSE 126*  --  109* 92  BUN 34*  --  41* 38*  CREATININE 4.12*  --  5.22* 5.04*  CALCIUM 7.8*  --  7.8* 8.3*   PT/INR No results found for this basename: LABPROT, INR,  in the last 72 hours Cholesterol  Recent Labs  07/26/12 0330  CHOL 214*    Assessment/Plan  Principal Problem:   Malignant hypertension with congestive heart failure Active Problems:   Diastolic  heart failure: Grade 3    Acute renal failure - suspect acute on chronic   Obesity, morbid   Hypertensive hypertrophic cardiomyopathy   Sinus tachycardia   Tobacco abuse  Plan:  Apparently the labetalol was not weaned off as was discussed with RN yesterday.  Will initiate weaning today.  Lopressor was started yesterday.  BP improved.  Will also wean the IV nitroglycerin   LOS: 3 days    Brittney Contreras 07/28/2012 8:48 AM  ATTENDING ATTESTATION:  I have seen and examined the patient along with Tarri Fuller, PA.  I have reviewed the chart, notes and new data.  I agree with his's findings, examination & recommendations as noted above.  Brief Description: Low insight.  Long-standing HTN poorly controlled presented with malignant HTN & acute diastolic HF, acute renal failure (likely both acute on chronic).     Key new complaints: sleepy  Key examination changes: S4 gallop  Key new findings / data: Cr stable/plateau; metabolic acidosis still present.    PLAN:  Will work towards weaning off IV drips -- PO meds ordered.  Metoprolol 100mg  bid, Hydralazine & Amlodipine.  Agree with Lasix as recommended by Nephrology -- anticipate that she would be on HD soon  ECG has essentially normalized to LVH related changes -- would d/c heparin after 72 hours total.  Doubt this is ACS - if anything LV strain related.  Can consider non-invasive evaluation in the future, but with her obesity, could easily have false + or - reading; definitely has cardiac RFs. (Metabolic Syndrome).    Smoking cessation counseling; Nutrition counseling.  She seems a bit overwhelmed by all of this & does not seem to comprehend the severity of the situation.     Brittney Contreras, M.D., M.S. [facility] [address] [place], [place]  [address]  [phone]  07/28/2012 8:48 AM

## 2012-07-28 NOTE — Progress Notes (Signed)
VASCULAR LAB PRELIMINARY  PRELIMINARY  PRELIMINARY  PRELIMINARY  Right  Upper Extremity Vein Map    Cephalic  Segment Diameter Depth Comment  1. Axilla 3.28mm mm   2. Mid upper arm 4.17mm mm   3. Above AC 4.19mm mm   4. In AC 5.60mm mm   5. Below AC 3.61mm mm branch  6. Mid forearm 3.11mm mm   7. Wrist 3.75mm mm    mm mm    mm mm    mm mm    Basilic  Segment Diameter Depth Comment  1. Axilla mm mm   2. Mid upper arm 3.38mm 15.65mm   3. Above [facility] 4.25mm 17.43mm   4. In AC 3.51mm 20.56mm   5. Below AC mm mm Could not visualize below AC  6. Mid forearm mm mm   7. Wrist mm mm    mm mm    mm mm    mm mm      Left Upper Extremity Vein Map    Cephalic  Segment Diameter Depth Comment  1. Axilla 3.78mm mm   2. Mid upper arm 3.23mm mm Chronic thrombus  3. Above AC 3.17mm mm Chronic thrombus  4. In [facility] 6.65mm mm Acute thrombus at level of IV insertion  5. Below AC mm mm   6. Mid forearm mm mm   7. Wrist mm mm    mm mm    mm mm    mm mm    Basilic  Segment Diameter Depth Comment  1. Axilla mm mm   2. Mid upper arm 4.45mm 21.12mm   3. Above AC 5.6mm 20.52mm   4. In [facility] 4.30mm 14.35mm   5. Below AC mm mm Could not visualize below AC  6. Mid forearm mm mm   7. Wrist mm mm    mm mm    mm mm    mm mm     Landry Mellow, RDMS, RVT  07/28/2012, 1:46 PM

## 2012-07-28 NOTE — Progress Notes (Signed)
Brief Nutrition Note:  RD consulted for diet education. Pt with likely new HD, morbid obesity, HTN. Needs weight loss and low sodium education.  RD attempted to talk with pt about diet. Pt with eyes closed at time of RD visit. Opened eyes to name call but promptly closed them once RD identified herself. Pt did not answer any questions, did not respond when asked if I could come back at a later time for education. RD left low sodium diet information at bedside.  If pt does start HD, will have a RD who will follow her from the HD center.   RD will sign off at this time. Please re-consult if pt wants to talk about diet/nutrition in the future.    Orson Slick RD, LDN Pager [phone] After Hours pager [phone]

## 2012-07-28 NOTE — Progress Notes (Signed)
Family Medicine Teaching Service Attending Note  I interviewed and examined patient Brittney Contreras and reviewed their tests and x-rays.  I discussed with Dr. Awanda Mink and reviewed their note for today.  I agree with their assessment and plan.     Additionally  This PM awake alert cheerful Able to stand and walk briefly without problems Titrate off IV medications and onto oral

## 2012-07-29 DIAGNOSIS — I119 Hypertensive heart disease without heart failure: Secondary | ICD-10-CM

## 2012-07-29 DIAGNOSIS — I214 Non-ST elevation (NSTEMI) myocardial infarction: Secondary | ICD-10-CM

## 2012-07-29 DIAGNOSIS — I5030 Unspecified diastolic (congestive) heart failure: Secondary | ICD-10-CM

## 2012-07-29 DIAGNOSIS — I509 Heart failure, unspecified: Secondary | ICD-10-CM

## 2012-07-29 DIAGNOSIS — Z733 Stress, not elsewhere classified: Secondary | ICD-10-CM

## 2012-07-29 DIAGNOSIS — I422 Other hypertrophic cardiomyopathy: Secondary | ICD-10-CM

## 2012-07-29 DIAGNOSIS — N179 Acute kidney failure, unspecified: Secondary | ICD-10-CM

## 2012-07-29 DIAGNOSIS — I10 Essential (primary) hypertension: Secondary | ICD-10-CM

## 2012-07-29 DIAGNOSIS — F17200 Nicotine dependence, unspecified, uncomplicated: Secondary | ICD-10-CM

## 2012-07-29 LAB — COMPREHENSIVE METABOLIC PANEL
ALT: 9 U/L (ref 0–35)
AST: 13 U/L (ref 0–37)
Albumin: 3 g/dL — ABNORMAL LOW (ref 3.5–5.2)
CO2: 18 mEq/L — ABNORMAL LOW (ref 19–32)
Chloride: 106 mEq/L (ref 96–112)
GFR calc non Af Amer: 10 mL/min — ABNORMAL LOW (ref >90)
Sodium: 138 mEq/L (ref 135–145)
Total Bilirubin: 0.3 mg/dL (ref 0.3–1.2)

## 2012-07-29 MED ORDER — METOPROLOL TARTRATE 25 MG PO TABS
125.0000 mg | ORAL_TABLET | Freq: Two times a day (BID) | ORAL | Status: DC
  Administered 2012-07-29 – 2012-07-31 (×6): 125 mg via ORAL
  Filled 2012-07-29 (×8): qty 1

## 2012-07-29 NOTE — Progress Notes (Signed)
FMTS Daily Intern Progress Note: pager: [phone]  Subjective:  No shortness of breath today but upset.  Does not wish to speak.    Objective Temp:  [98 F (36.7 C)-98.7 F (37.1 C)] 98 F (36.7 C) (02/25 0800) Pulse Rate:  [67-82] 81 (02/24 2125) Resp:  [0-22] 16 (02/25 0600) BP: (114-177)/(73-105) 156/89 mmHg (02/25 0600) SpO2:  [95 %-100 %] 96 % (02/25 0805)   Intake/Output Summary (Last 24 hours) at 07/29/12 0935 Last data filed at 07/29/12 0800  Gross per 24 hour  Intake 687.18 ml  Output   2610 ml  Net -1922.82 ml    CBG (last 3)   Recent Labs  07/27/12 2113 07/28/12 0738 07/28/12 1152  GLUCAP 108* 91 93    General: depressed mood, crying  HEENT: moist mucous membranes, PERRLA CV: [id number], RRR, 2/6 systolic murmur best heard at right sternal border  Pulm: CTA b/l, no crackles Abd: soft, non tender, non distended Ext: no edema Neuro: alert and oriented x3  Labs and Imaging  Recent Labs Lab 07/26/12 0330 07/27/12 0102 07/28/12 0525  WBC 9.1 9.9 8.5  HGB 11.4* 10.4* 10.5*  HCT 30.9* 28.3* 29.6*  PLT 188 175 186     Recent Labs Lab 07/27/12 0102 07/28/12 0525 07/29/12 0515  NA 136 138 138  K 3.4* 3.9 3.7  CL 107 110 106  CO2 17* 16* 18*  BUN 41* 38* 43*  CREATININE 5.22* 5.04* 5.23*  GLUCOSE 109* 92 102*  CALCIUM 7.8* 8.3* 8.6   Study Conclusions  - Left ventricle: The cavity size was at the upper limits of normal. Wall thickness was increased in a pattern of severe LVH. There was severe concentric hypertrophy. Systolic function was normal. The estimated ejection fraction was in the range of 50% to 55%. Wall motion was normal; there were no regional wall motion abnormalities. There was a reduced contribution of atrial contraction to ventricular filling, due to increased ventricular diastolic pressure or atrial contractile dysfunction. Doppler parameters are consistent with a reversible restrictive pattern, indicative of decreased  left ventricular diastolic compliance and/or increased left atrial pressure (grade 3 diastolic dysfunction). Doppler parameters are consistent with elevated mean left atrial filling pressure. - Mitral valve: Mild regurgitation. - Left atrium: The atrium was mildly to moderately dilated. - Right ventricle: Systolic pressure was increased. - Atrial septum: No defect or patent foramen ovale was identified. - Pulmonary arteries: PA peak pressure: 27mm Hg (S). Impressions:  - The right ventricular systolic pressure was increased consistent with mild pulmonary hypertension.  RENAL/URINARY TRACT ULTRASOUND COMPLETE  Comparison: None.  Findings:  Right Kidney: 10.1 cm. No hydronephrosis. Mildly increased renal  echogenicity. Mild renal cortical thinning for age.  Left Kidney: 10.5 cm. No hydronephrosis. Mildly increased  echogenicity. Normal renal cortical thickness.  Bladder: Collapsed around a Foley catheter.  Note is made of bilateral pleural effusions.  IMPRESSION:  No hydronephrosis.  Increased renal echogenicity, suggesting medical renal disease.  Bilateral pleural effusions.    Assessment and Plan 37 yo female who presented with hypertensive emergency, elevated BNP, CHF and acute likely on chronic renal failure.  # Hypertensive emergency: BP improved from admission off of drips at this point.  Resolved and will transition to PO medication - per cards: will start Lopressor 100 mg BID titrating up to 125 mg BID, Hydralazine 100 mg TID, and Norvasc 10 mg qd.   -  Heparin drip d/c  - plan for myoview once stable, per cardiology. -Lasix added back on  for fluid over load per cardiology and nephro    # Acute diastolic CHF: Echo with EF: 50-55% with grade 3 diastolic dysfunction and severe LVH and mild pulmonary HTN.  Overall negative fluid balance since admission. Net output of 1.4 L on 2/24. No evidence of fluid overload. Dyspnea improving.  - continue beta blocker and BP  control - lasix added back on per cardiology and nephrology.   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 Creatinine 4.12 to 5.22 to 5.23 today -  Per neprhology, she will need fistula for permanent dialysis - Vascular to place graft in Left arm, vein mapping per vascular - Will follow nephro recs, greatly appreciate   # Hypokalemia: in setting of lasix use: Resolved  - 3.9 this AM - monitor BMP daily - follow up renin/aldo (pending), Cortisol 9.6  # non anionic gap acidosis: stable from previous: likely from renal failure.  - continue to monitor BMP.  - Bicarb started 07/26/12  # Hypothyroid: elevated TSH at 30.4. Could be from transient elevation from non-thyroid illness in a hospitalized patient, however with level being above 20, this could likely be from true hypothyroid.  - retest after acute illness - Free T4 decreased to 0.76, Free T3 2.3  #Microcytic Anemia - Iron panel consistent with anemia of chronic disease  1) Will continue to monitor   #Mood Disorder, possibly Bipolar with mixed mood disorder (depression)  -Consult to Psych today for further evaluation  -Consider starting Seroquel this PM  FEN: heart healthy PPx: heparin SQ Dispo: Transfer to SDU today  Prentiss Polio R. Awanda Mink, DO of [facility] 07/29/2012, 9:35 AM

## 2012-07-29 NOTE — Progress Notes (Signed)
[facility]  Subjective: No complaints.  Not very compliant this morning.  Objective: Vital signs in last 24 hours: Temp:  [98 F (36.7 C)-98.7 F (37.1 C)] 98.7 F (37.1 C) (02/25 0400) Pulse Rate:  [67-82] 81 (02/24 2125) Resp:  [0-22] 16 (02/25 0600) BP: (114-177)/(73-105) 156/89 mmHg (02/25 0600) SpO2:  [94 %-100 %] 98 % (02/25 0400) Last BM Date: 07/28/12  Intake/Output from previous day: 02/24 0701 - 02/25 0700 In: 1036.8 [P.O.:480; I.V.:556.8] Out: 2460 [Urine:2460] Intake/Output this shift:    Medications Current Facility-Administered Medications  Medication Dose Route Frequency Provider Last Rate Last Dose  . 0.9 %  sodium chloride infusion  250 mL Intravenous PRN Gerda Diss, DO      . acetaminophen (TYLENOL) tablet 650 mg  650 mg Oral Q4H PRN Gerda Diss, DO      . amLODipine (NORVASC) tablet 10 mg  10 mg Oral QHS Placido Sou, MD   10 mg at 07/28/12 2125  . aspirin EC tablet 81 mg  81 mg Oral Daily Gerda Diss, DO   81 mg at 07/28/12 [id number]  . atorvastatin (LIPITOR) tablet 20 mg  20 mg Oral q1800 Gerda Diss, DO   20 mg at 07/28/12 1723  . furosemide (LASIX) tablet 80 mg  80 mg Oral BID Placido Sou, MD   80 mg at 07/28/12 1723  . heparin injection 5,000 Units  5,000 Units Subcutaneous Q8H Josalyn Funches, MD   5,000 Units at 07/28/12 2125  . hydrALAZINE (APRESOLINE) tablet 100 mg  100 mg Oral Q8H Josalyn Funches, MD   100 mg at 07/29/12 0525  . labetalol (NORMODYNE,TRANDATE) 4 mg/mL in dextrose 5 % 125 mL infusion  2 mg/min Intravenous Titrated Tarri Fuller, PA   0.75 mg/min at 07/28/12 0700  . levothyroxine (SYNTHROID, LEVOTHROID) tablet 75 mcg  75 mcg Oral QAC breakfast Alveda Reasons, MD   75 mcg at 07/28/12 [id number]  . metoprolol (LOPRESSOR) tablet 100 mg  100 mg Oral BID Kandis Nab, MD   100 mg at 07/28/12 2125  . nitroGLYCERIN (NITROSTAT) SL tablet 0.4 mg  0.4 mg Sublingual Q5 Min x 3 PRN Gerda Diss, DO      . nitroGLYCERIN 0.2 mg/mL in dextrose 5 % infusion  2-200 mcg/min Intravenous Titrated Tarri Fuller, PA   15 mcg/min at 07/29/12 0100  . ondansetron (ZOFRAN) injection 4 mg  4 mg Intravenous Q6H PRN Gerda Diss, DO      . sodium bicarbonate tablet 650 mg  650 mg Oral BID Alveda Reasons, MD   650 mg at 07/28/12 2124  . sodium chloride 0.9 % injection 3 mL  3 mL Intravenous Q12H Gerda Diss, DO   3 mL at 07/28/12 2200  . sodium chloride 0.9 % injection 3 mL  3 mL Intravenous PRN Gerda Diss, DO        PE: General appearance: alert, no distress, morbidly obese and Not very cooperative. Lungs: clear to auscultation bilaterally Heart: regular rate and rhythm, S1, S2 normal, no murmur, click, rub or gallop Extremities: Trace LEE Pulses: 2+ and symmetric Skin: Warm and dry. Neurologic: Grossly normal  Lab Results:   Recent Labs  07/27/12 0102 07/28/12 0525  WBC 9.9 8.5  HGB 10.4* 10.5*  HCT 28.3* 29.6*  PLT 175 186   BMET  Recent Labs  07/27/12 0102 07/28/12 0525 07/29/12 0515  NA 136 138 138  K 3.4*  3.9 3.7  CL 107 110 106  CO2 17* 16* 18*  GLUCOSE 109* 92 102*  BUN 41* 38* 43*  CREATININE 5.22* 5.04* 5.23*  CALCIUM 7.8* 8.3* 8.6    Assessment/Plan  Principal Problem:   Malignant hypertension with congestive heart failure Active Problems:   Diastolic heart failure: Grade 3    Acute renal failure - suspect acute on chronic   Obesity, morbid   Sinus tachycardia   Tobacco abuse   Hypertensive hypertrophic cardiomyopathy  Plan:  BP ranging 130-170's with a low of 114/85.  Amlodipine 10mg , Lopressor 100mg  bid, hydralazine 100mg  TID.  With PO lasix net fluids: -1423 plus 3 unmeasured.  Will titrate lopressor to 125mg  BID   LOS: 4 days    Brittney Contreras 07/29/2012 7:43 AM

## 2012-07-29 NOTE — Progress Notes (Addendum)
ATTENDING ATTESTATION:  I have seen and examined the patient along with [facility].  I have reviewed the chart, notes and new data.  I agree with his findings, examination & recommendations as noted above.  Brief Description: 37 y/o woman admitted with Malignant HTN - & A on C Diastolic HF & Renal Failure.   Key new complaints: none today.  Key examination changes: -- unpleasant attitude noted,   Key new findings / data: UOP responded to Lasix, but Cr up to ~5.2; BP swings up & down, but currently 145/103.  PLAN:  At this point, she is out of acute danger from her Hypertensive Crisis, but is likely well on her way towards HD.  She is making urine & BUN is relatively normal as are electrolytes. => Nephrology on-board and pre-emptive plans for discussing HD access made.  Metabolic Acidosis seems to be improving - CO2 up to 18.  No CP despite abnormal ECG -- ? If the changes are more related to LVH than ischemia. --> will need ischemic evaluation, but can be done once her current concerns are addressed.  Oral Meds being titrated -- I think we need to hold off on further titration for a few days, until she has had a chance for all of the meds to take effect -- Amlodipine just started & up-titrated  Anticipate transfer out of ICU today, at least to stepdown.  Leonie Man, M.D., M.S. [facility] [address] [place], [place]  [address]  [phone]  07/29/2012 8:21 AM

## 2012-07-29 NOTE — Progress Notes (Signed)
Wray Kearns PA notified that two IV team RN's were unable to place peripherall IV to the LUE.

## 2012-07-29 NOTE — Progress Notes (Signed)
Clinical Social Work Department CLINICAL SOCIAL WORK PSYCHIATRY SERVICE LINE ASSESSMENT 07/29/2012  Patient:  Brittney Contreras  Account:  [id number]  Admit Date:  07/25/2012  Clinical Social Worker:  Sindy Messing, LCSW  Date/Time:  07/29/2012 12:00 N Referred by:  Physician  Date referred:  07/29/2012 Reason for Referral  Psychosocial assessment   Presenting Symptoms/Problems (In the person's/family's own words):   Psych consulted due to patient having a mood disorder   Abuse/Neglect/Trauma History (check all that apply)  Denies history   Abuse/Neglect/Trauma Comments:   Psychiatric History (check all that apply)  Denies history   Psychiatric medications:  None   Current Mental Health Hospitalizations/Previous Mental Health History:   Patient denies any MH currently or any former diagnosis   Current provider:   N/A   Place and Date:   N/A   Current Medications:   sodium chloride, acetaminophen, nitroGLYCERIN, ondansetron (ZOFRAN) IV, sodium chloride            . amLODipine  10 mg Oral QHS  . aspirin EC  81 mg Oral Daily  . atorvastatin  20 mg Oral q1800  . furosemide  80 mg Oral BID  . heparin subcutaneous  5,000 Units Subcutaneous Q8H  . hydrALAZINE  100 mg Oral Q8H  . levothyroxine  75 mcg Oral QAC breakfast  . metoprolol tartrate  125 mg Oral BID  . sodium bicarbonate  650 mg Oral BID  . sodium chloride  3 mL Intravenous Q12H   Previous Impatient Admission/Date/Reason:   None reported   Emotional Health / Current Symptoms    Suicide/Self Harm  None reported   Suicide attempt in the past:   Patient denies any current SI or HI. When asked if she was experiencing any SI who would she report it to, patient reports that "those are private thoughts and I would not tell anyone but just keep them to myself."   Other harmful behavior:   None reported   Psychotic/Dissociative Symptoms  None reported   Other Psychotic/Dissociative Symptoms:    Attention/Behavioral  Symptoms  Withdrawn   Other Attention / Behavioral Symptoms:   Patient avoided eye contact throughout assessment and answered briefly.    Cognitive Impairment  Unable to accurately assess   Other Cognitive Impairment:   Patient denies any learning disabilities but reports it was difficult for her to learn in school and difficult to understand problems at times.    Mood and Adjustment  Flat  Guarded    Stress, Anxiety, Trauma, Any Recent Loss/Stressor  None reported   Anxiety (frequency):   Phobia (specify):   Compulsive behavior (specify):   Obsessive behavior (specify):   Other:   Substance Abuse/Use  Current substance use   SBIRT completed (please refer for detailed history):  Y  Self-reported substance use:   Patient reports she drinks beer occasionally. Patient reports that she usually only drinks 1-2 drinks but has drank up to 12 beers at one time. Patient denies all other substance use and denies any resources.   Urinary Drug Screen Completed:  N Alcohol level:    Environmental/Housing/Living Arrangement  With Family Member   Who is in the home:   Uncle   Emergency contact:  [place]   Patient's Strengths and Goals (patient's own words):   Patient reports that uncle is supportive and takes care of her.   Clinical Social Worker's Interpretive Summary:   CSW received referral to complete psychosocial assessment. CSW reviewed chart and met with  patient and psychiatrist at bedside. No visitors present.    CSW introduced myself and explained role. Patient reports that she came to the hospital because she was having trouble breathing. Patient reports that she was diagnosed with high blood pressure about 6 months ago but never received any further treatment and did not take prescribed medication for blood pressure.    Patient lived with mom until she dropped out of school in the 9th grade. Patient reports she enjoyed school but it was  difficult for her to learn. Patient lived with friends after she dropped out and eventually moved in with her uncle. Patient has never worked and never went back to get her GED.    Patient denies any current or former MH diagnosis. Patient denies any current symptoms for depression, anxiety or any further MI. Patient reports that she felt anxious on arrival due to not being able to breathe but feels better now. Patient denies any SI/HI or psychotic features.    Patient guarded and flat throughout assessment. Patient avoided eye contact and answered all questions with short answers. Patient reports she understands medical diagnosis but has made poor decisions regarding compliance with recommendations.    CSW will continue to follow and will attempt to speak with patient again regarding mood and behaviors. CSW will follow any recommendations provided by psychiatrist.   Disposition:  Recommend Psych CSW continuing to support while in hospital

## 2012-07-29 NOTE — Progress Notes (Addendum)
Family Medicine Teaching Service Attending Note  I interviewed and examined patient Brittney Contreras and reviewed their tests and x-rays.  I discussed with Dr. Awanda Mink and reviewed their note for today.  I agree with their assessment and plan.     Additionally  This afternoon alert conversant pleasant Reports "they are going to put in IV in my hand for dialysis"  Does not recall speaking with anyone about her renal prognosis Continue change to oral antibiotics and proceed as per renal regarding renal failure and need for dialysis Appreciate their expertise.

## 2012-07-29 NOTE — Consult Note (Signed)
Patient Identification:  Brittney Contreras Date of Evaluation:  07/29/2012 Reason for Consult:  Mood Disorder   Referring Provider: Dr. Kathleen Lime PFTS  History of Present Illness:pt says she is here because she had difficulty breathing.  She was found to be short of breath in hypertensive crisis   She also had pulmonary edema.  She admits she has hypertension, - diagnosed 'about 6 months ago' but denies she has taken medication for it. She does not know other reasons for being here.  Past Psychiatric History: Pt denies any history of depression, anxiety, or psychotic symptoms.  She denies suicidal ideation but states if she ever had SI, it would be a personal matter and would never tell anyone.  She says she drinks a can of beer and at the most has drunk a 12-pack of beer.   She denies use of cannabis, drugs; is smoking ~ 1-3 cigarettes.   She left school after 9th grade, does not have GED.  Does not say what she did except stayed with friends; now lives with uncle and 3 cousins.  Past Medical History:     Past Medical History  Diagnosis Date  . Hypertension   . Hypertensive hypertrophic cardiomyopathy 07/28/2012  . Diastolic heart failure: Grade 3  07/25/2012  . Obesity, morbid 07/25/2012  . Malignant hypertension with congestive heart failure 07/25/2012  . Tobacco abuse 07/27/2012      History reviewed. No pertinent past surgical history.  Allergies: No Known Allergies  Current Medications:  Prior to Admission medications   Not on File    Social History:    reports that she has been smoking Cigarettes.  She has been smoking about 0.00 packs per day. She does not have any smokeless tobacco history on file. She reports that  drinks alcohol. She reports that she does not use illicit drugs.   Family History:    Family History  Problem Relation Age of Onset  . Kidney disease      Aunt, deceased, had been on dialysis  . Hypertension Mother   . Heart disease Mother     Mental Status  Examination/Evaluation: Objective:  Appearance: Casual and in bed, calm  Eye Contact::  Absent  Speech:  Clear and Coherent and Slow  Volume:  Normal  Mood:  guarded  Affect:  Flat  Thought Process:  Coherent and Linear  Orientation:  Other:  Pt is oriented to month and year; she uses abstract reasoning to explain proverb. and example to mail letter.  She makes two errors and is Very slo calculating serial 3s from 20 - 13.  She mispells world: wrld and spells backwards faster than her calculations: dlrow:  She remembers only 1 of 3 words.   Thought Content:  not expressed; guarded  Suicidal Thoughts:  No  Homicidal Thoughts:  No  Judgement:  Impaired  Insight:  Lacking   DIAGNOSIS:   AXIS I  Borderline Intellectual Functioning;  AXIS II  Deferred  AXIS III See medical notes.  AXIS IV economic problems, other psychosocial or environmental problems, problems related to social environment and Pt does not give reason for living with uncle and not working given report pt was previously a [employer]ar[employer] manager - reason for not working; not given.   AXIS V 31-40 impairment in reality testing   Assessment/Plan:  Discussed with Dr. Lenon Ahmadi, Psych CSW Pt apparently has hypertension, untreated until she experienced crisis to cause admission to [facility].  She offers no history, not even that she knew she  had HTN and was given Rx to treat it.  She is very slow in answering all questions.  She may have uncle providing housing and care which she is unable to manage on her own.  She provides no history of her activities between age ~ 29- adulthood.  She denies any relationship, children, friends.      Pt apparently does not understand the severity and consequence of untreated hypertension.    Contact with sister in [place] or with uncle; given patient's permission will be helpful.  RECOMMENDATION:  1.  Pt does has limited capacity to participate in medical treatment plans- may change if her presentation changes and is  able to verbalize her understanding of need for hemodialysis.  2.  Suggest contact with sister/uncle for collateral information. 3.   No psychiatric medications suggested at this time.  4.   Pt is in need of psycho-education of her medical condition and methods necessary to treat it.  5.  Will follow pt.  Cristin Szatkowski MD 07/29/2012 3:08 PM

## 2012-07-29 NOTE — Progress Notes (Signed)
Subjective: Interval History: has complaints sleepy.  Objective: Vital signs in last 24 hours: Temp:  [98 F (36.7 C)-98.7 F (37.1 C)] 98 F (36.7 C) (02/25 0800) Pulse Rate:  [67-82] 81 (02/24 2125) Resp:  [0-22] 16 (02/25 0600) BP: (114-177)/(73-105) 156/89 mmHg (02/25 0600) SpO2:  [95 %-100 %] 96 % (02/25 0805) Weight change:   Intake/Output from previous day: 02/24 0701 - 02/25 0700 In: 1036.8 [P.O.:480; I.V.:556.8] Out: 2460 [Urine:2460] Intake/Output this shift: Total I/O In: -  Out: 400 [Urine:400]  General appearance: cooperative, morbidly obese and slowed mentation Resp: diminished breath sounds bilaterally and rales bibasilar Cardio: S1, S2 normal and systolic murmur: holosystolic 2/6, blowing at apex GI: obese, pos bs,liver down6 cm Extremities: edema 1+ Skin: Skin color, texture, turgor normal. No rashes or lesions or dry  Lab Results:  Recent Labs  07/27/12 0102 07/28/12 0525  WBC 9.9 8.5  HGB 10.4* 10.5*  HCT 28.3* 29.6*  PLT 175 186   BMET:  Recent Labs  07/28/12 0525 07/29/12 0515  NA 138 138  K 3.9 3.7  CL 110 106  CO2 16* 18*  GLUCOSE 92 102*  BUN 38* 43*  CREATININE 5.04* 5.23*  CALCIUM 8.3* 8.6   No results found for this basename: PTH,  in the last 72 hours Iron Studies:  Recent Labs  07/27/12 1042  IRON 37*  TIBC 230*  FERRITIN 167    Studies/Results: No results found.  I have reviewed the patient's current medications.  Assessment/Plan: 1 CKD 5 needs perm access. Not uremic, will use po bicarb for acidemia.  diuesing to help breathing and bp.  Discussed status with her 2 HTN meds, diuresis 3 anemia given iv Fe 4 HPTH P 5 Obesity complic all tx 6 COPD P Access, bp meds, diuresis,     LOS: 4 days   Keyler Hoge L 07/29/2012,9:36 AM

## 2012-07-30 DIAGNOSIS — N184 Chronic kidney disease, stage 4 (severe): Secondary | ICD-10-CM | POA: Diagnosis present

## 2012-07-30 DIAGNOSIS — N185 Chronic kidney disease, stage 5: Secondary | ICD-10-CM

## 2012-07-30 LAB — RENAL FUNCTION PANEL
BUN: 47 mg/dL — ABNORMAL HIGH (ref 6–23)
CO2: 19 mEq/L (ref 19–32)
Calcium: 8.9 mg/dL (ref 8.4–10.5)
GFR calc Af Amer: 11 mL/min — ABNORMAL LOW (ref >90)
Glucose, Bld: 97 mg/dL (ref 70–99)
Phosphorus: 4.9 mg/dL — ABNORMAL HIGH (ref 2.3–4.6)
Potassium: 3.9 mEq/L (ref 3.5–5.1)

## 2012-07-30 LAB — PARATHYROID HORMONE, INTACT (NO CA): PTH: 439.2 pg/mL — ABNORMAL HIGH (ref 14.0–72.0)

## 2012-07-30 LAB — HEPATITIS B SURFACE ANTIGEN: Hepatitis B Surface Ag: NEGATIVE

## 2012-07-30 NOTE — Progress Notes (Signed)
FMTS Daily Intern Progress Note: pager: [phone]  Subjective:  Pt doing a little better today.  She has no complaints and states she is overall feeling better since coming in to the hospital.    Objective Temp:  [97.3 F (36.3 C)-98.3 F (36.8 C)] 97.9 F (36.6 C) (02/26 0400) Pulse Rate:  [64-76] 64 (02/26 0800) Resp:  [11-22] 18 (02/26 0800) BP: (133-167)/(69-95) 133/87 mmHg (02/26 0800) SpO2:  [94 %-97 %] 97 % (02/26 0400)   Intake/Output Summary (Last 24 hours) at 07/30/12 0935 Last data filed at 07/30/12 0500  Gross per 24 hour  Intake    720 ml  Output   2400 ml  Net  -1680 ml    CBG (last 3)   Recent Labs  07/27/12 2113 07/28/12 0738 07/28/12 1152  GLUCAP 108* 91 93    General: depressed mood, crying  HEENT: moist mucous membranes, PERRLA CV: [id number], RRR, 2/6 systolic murmur best heard at right sternal border  Pulm: CTA b/l, no crackles Abd: soft, non tender, non distended Ext: no edema Neuro: alert and oriented x3  Labs and Imaging  Recent Labs Lab 07/26/12 0330 07/27/12 0102 07/28/12 0525  WBC 9.1 9.9 8.5  HGB 11.4* 10.4* 10.5*  HCT 30.9* 28.3* 29.6*  PLT 188 175 186     Recent Labs Lab 07/28/12 0525 07/29/12 0515 07/30/12 0555  NA 138 138 138  K 3.9 3.7 3.9  CL 110 106 104  CO2 16* 18* 19  BUN 38* 43* 47*  CREATININE 5.04* 5.23* 5.25*  GLUCOSE 92 102* 97  CALCIUM 8.3* 8.6 8.9   Study Conclusions  - Left ventricle: The estimated ejection fraction was in the range of 50% to 55%. Indicative of decreased left ventricular diastolic compliance and/or increased left atrial pressure (grade 3 diastolic dysfunction). Doppler parameters are consistent with elevated mean left atrial filling pressure. - Mitral valve: Mild regurgitation. - Left atrium: The atrium was mildly to moderately dilated. - Right ventricle: Systolic pressure was increased. - Pulmonary arteries: PA peak pressure: 58mm Hg (S). Impressions: - The right ventricular  systolic pressure was increased consistent with mild pulmonary hypertension.  RENAL/URINARY TRACT ULTRASOUND COMPLETE  IMPRESSION:  No hydronephrosis.  Increased renal echogenicity, suggesting medical renal disease.  Bilateral pleural effusions.    Assessment and Plan 37 yo female who presented with hypertensive emergency, elevated BNP, CHF and acute likely on chronic renal failure.  # Hypertensive emergency, resolved: BP improved from admission off of drips at this point.  Tolerated PO medication x 1 day with pressures ranging from 142/69 to 167/95 - Will continue PO meds including: Lopressor 125 mg BID, Hydralazine 100 mg TID, and Norvasc 10 mg qd.   -Continue Lasix for fluid overload and diastolic CHF exacerbation  - Will f/u with Dr. Claiborne Contreras, [facility] cardiology in outpatient setting.  Plan for myoview at this time    # Acute diastolic CHF exacerbation: Echo with EF: 50-55% with grade 3 diastolic dysfunction and severe LVH and mild pulmonary HTN. - Overall negative fluid balance since admission. Net output of 1.9 L past 24 hrs.  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 Creatinine 4.12 to 5.22 to 5.23 to 5.25 today  -  Per neprhology, she will need fistula for permanent dialysis - Vascular to place graft in Left arm, vein mapping per vascular - Will follow nephro recs,  greatly appreciate   # Hypokalemia: in setting of lasix use: Resolved  - 3.9 this AM - monitor BMP daily - follow up renin/aldo (pending), Cortisol 9.6  # non anionic gap acidosis: stable from previous: likely from renal failure.  - continue to monitor BMP.  - Bicarb started 07/26/12  # Hypothyroid: elevated TSH at 30.4. Could be from transient elevation from non-thyroid illness in a hospitalized patient, however with level being above 20, this could likely be from true hypothyroid.  - retest after acute  illness - Free T4 decreased to 0.76, Free T3 2.3  #Microcytic Anemia - Iron panel consistent with anemia of chronic disease  1) Will continue to monitor   2) IV Fe per renal  #Mood Disorder vs. cognitive impairment   -Consult to Psych, greatly appreciate recommendations   -Per psych, no medications at this time.  Believe her capacity to participate is limited   FEN: heart healthy PPx: heparin SQ Dispo: Transfer to regular floor today.  If BP stable today, can be d/c in next one to two days.    Brittney Oddi [name], DO of [facility] 07/30/2012, 8:15 AM

## 2012-07-30 NOTE — Progress Notes (Signed)
Subjective: Interval History: none.  Objective: Vital signs in last 24 hours: Temp:  [97.3 F (36.3 C)-98.3 F (36.8 C)] 97.7 F (36.5 C) (02/26 0800) Pulse Rate:  [64-76] 64 (02/26 0800) Resp:  [18-22] 18 (02/26 0800) BP: (133-167)/(78-95) 133/87 mmHg (02/26 0800) SpO2:  [94 %-97 %] 97 % (02/26 0400) Weight change:   Intake/Output from previous day: 02/25 0701 - 02/26 0700 In: 840 [P.O.:840] Out: 2800 [Urine:2800] Intake/Output this shift: Total I/O In: -  Out: 400 [Urine:400]  General appearance: alert and morbidly obese Resp: diminished breath sounds bilaterally Cardio: S1, S2 normal and systolic murmur: holosystolic 2/6, blowing at apex GI: pos bs.liver down 6 cm Extremities: edema 1+  Lab Results:  Recent Labs  07/28/12 0525  WBC 8.5  HGB 10.5*  HCT 29.6*  PLT 186   BMET:  Recent Labs  07/29/12 0515 07/30/12 0555  NA 138 138  K 3.7 3.9  CL 106 104  CO2 18* 19  GLUCOSE 102* 97  BUN 43* 47*  CREATININE 5.23* 5.25*  CALCIUM 8.6 8.9   No results found for this basename: PTH,  in the last 72 hours Iron Studies:  Recent Labs  07/27/12 1042  IRON 37*  TIBC 230*  FERRITIN 167    Studies/Results: No results found.  I have reviewed the patient's current medications.  Assessment/Plan: 1 CKD 5 needs access. Will get VVS back 2 HTN meds, coming down 3 Obesity 4 Anemia Fe ok 5 tobacco abuse. P access, bp meds, diuresis.   LOS: 5 days   Matthan Sledge L 07/30/2012,10:04 AM

## 2012-07-30 NOTE — Progress Notes (Signed)
Family Medicine Teaching Service Attending Note  I interviewed and examined patient Brittney Contreras and reviewed their tests and x-rays.  I discussed with Dr. Awanda Mink and reviewed their note for today.  I agree with their assessment and plan.     Additionally  Sleepy this AM  Denies pain Uncertain ift she wants for access Suspect possible depression/bipolar disease Consider trial of medication perhaps seroquel or lamictal

## 2012-07-30 NOTE — Progress Notes (Signed)
Clinical Social Work Progress Note PSYCHIATRY SERVICE LINE 07/30/2012  Patient:  Brittney Contreras  Account:  [id number]  [place] Date:  07/25/2012  Clinical Social Worker:  Sindy Messing, LCSW  Date/Time:  07/30/2012 02:30 PM  Review of Patient  Overall Medical Condition:   Patient reports she is feeling better but reported to psych MD that she is getting fistula placed on Friday and will need dialysis.   Participation Level:  Minimal  Participation Quality  Appropriate   Other Participation Quality:   Affect  Flat   Cognitive  Alert   Reaction to Medications/Concerns:   None reported   Modes of Intervention  Support   Summary of Progress/Plan at Discharge   CSW spoke with psych MD who reports that patient was more talkative in session today and spoke about becoming a CNA. Psych MD requests CSW still visit patient and inquire about talking with family.    CSW entered room and met with patient. Patient laying in bed awake with tv off. Patient reports that she feels she is getting better but has to be on dialysis. Patient reports that family members have been on HD and she is not happy about it. Patient reports she will be compliant but that she wishes she did not need it.    CSW spoke with patient regarding mood and thoughts. Patient had flat affect throughout assessment but reports that mom and family came to visit her last night. Patient agreeable to CSW contacting uncle for collateral information. CSW called uncle and left a message with CSW contact information. CSW will continue to follow.

## 2012-07-30 NOTE — Progress Notes (Signed)
Patient refused CPAP last night and it was removed from his room yesterday.

## 2012-07-30 NOTE — Consult Note (Signed)
Patient Identification:  Brittney Contreras Date of Evaluation:  07/30/2012 Reason for Consult:  Capacity  Referring Provider: FPTS  History of Present Illness:  Brittney Contreras is a 37 yo woman who says she is here because she had difficulty breathing. She was found to be short of breath in hypertensive crisis She also had pulmonary edema. She admits she has hypertension, - diagnosed 'about 6 months ago' but denies she has taken medication for it. She does not know other reasons for being here. Chart review suggests she has had HTN at least 2 yrs ago.  She says she is taking medication for HTN 'as of now'.  Past Psychiatric History: Pt denies any history of depression, anxiety, or psychotic symptoms. She denies suicidal ideation but states if she ever had SI, it would be a personal matter and would never tell anyone.  She says she drinks a can of beer and at the most has drunk a 12-pack of beer. She denies use of cannabis, drugs; is smoking ~ 1-3 cigarettes. She left school after 9th grade, does not have GED. Does not say what she did except stayed with friends; now lives with uncle and 3 cousins  Past Medical History:     Past Medical History  Diagnosis Date  . Hypertension   . Hypertensive hypertrophic cardiomyopathy 07/28/2012  . Diastolic heart failure: Grade 3  07/25/2012  . Obesity, morbid 07/25/2012  . Malignant hypertension with congestive heart failure 07/25/2012  . Tobacco abuse 07/27/2012      History reviewed. No pertinent past surgical history.  Allergies: No Known Allergies  Current Medications:  Prior to Admission medications   Not on File    Social History:    reports that she has been smoking Cigarettes.  She has been smoking about 0.00 packs per day. She does not have any smokeless tobacco history on file. She reports that  drinks alcohol. She reports that she does not use illicit drugs.   Family History:    Family History  Problem Relation Age of Onset  . Kidney disease       Aunt, deceased, had been on dialysis  . Hypertension Mother   . Heart disease Mother     Mental Status Examination/Evaluation: Objective:  Appearance: morbid obesity, hirsuitism, awake  Eye Contact::  Good  Speech:  Clear and Coherent  Volume:  Normal  Mood:  apprehensive  Affect:  Constricted and Depressed  Thought Process:  Logical and May need education to better understand the HD process  Orientation:  Other:  oriented to person and place, need for HTN medication  Thought Content:  pasive, withholding thoughts  Suicidal Thoughts:  No  Homicidal Thoughts:  No  Judgement:  Poor  Insight:  Lacking   DIAGNOSIS:   AXIS I  Borderline intellectual functioning  AXIS II  Deferred  AXIS III See medical notes.  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 Ellyn Hack, Dr. Ardelia Mems, Psych CSW   Pt is more alert and interactive today.  She knows she will be getting a fistula on Friday for future HD  She demonstrates that she never engage in reality of need to treat, or even admitted it, that she had hypertension.  The process and purpose are simply discussed with her.  It is difficult to assess what she actually  understands.   RECOMMENDATION:  1.  Pt does not have capacity to understand hypertension, stating she is 'just' beginning to take medication for it.  2.  Requested Psych CSW to obtain permission to contact uncle and/or sister for collateral information. 3.  Pt is willing to have HD but is apprehensive 4.  Will follow patient to continue assessment. Maryruth Eve MD 07/30/2012 12:10 PM

## 2012-07-30 NOTE — Consult Note (Signed)
Pt seen and examined.  Still has IVs in 2 locations on right arm.  Vein map reviewed.  Has thrombosis of left cephalic vein.  Right cephalic vein of reasonable quality for fistula.  Plan is for right radial cephalic fistula on Friday by Dr Kellie Simmering (vein is 3-5 mm)  Procedure risks and benefits discussed with pt.  She currently agrees to proceed.  Please move all IVs out of right arm and place restricted band  NPO p midnight Thursday Consent.  Ruta Hinds, MD [facility] Office: [phone] Pager: [phone]

## 2012-07-30 NOTE — Progress Notes (Addendum)
Patient ID: Brittney Contreras, female   DOB: [birth year], 37 y.o.   MRN: [id number]  [facility]  Subjective: No complaints.  Continues to be uncooperative.  Objective: Vital signs in last 24 hours: Temp:  [97.3 F (36.3 C)-98.3 F (36.8 C)] 97.9 F (36.6 C) (02/26 0400) Pulse Rate:  [67-76] 67 (02/26 0400) Resp:  [11-22] 20 (02/26 0400) BP: (142-167)/(69-95) 152/95 mmHg (02/26 0646) SpO2:  [94 %-97 %] 97 % (02/26 0400) Last BM Date: 07/28/12  Intake/Output from previous day: 02/25 0701 - 02/26 0700 In: 840 [P.O.:840] Out: 2800 [Urine:2800] Intake/Output this shift:    Medications Current Facility-Administered Medications  Medication Dose Route Frequency Provider Last Rate Last Dose  . 0.9 %  sodium chloride infusion  250 mL Intravenous PRN Gerda Diss, DO      . acetaminophen (TYLENOL) tablet 650 mg  650 mg Oral Q4H PRN Gerda Diss, DO      . amLODipine (NORVASC) tablet 10 mg  10 mg Oral QHS Placido Sou, MD   10 mg at 07/29/12 2128  . aspirin EC tablet 81 mg  81 mg Oral Daily Gerda Diss, DO   81 mg at 07/29/12 [id number]  . atorvastatin (LIPITOR) tablet 20 mg  20 mg Oral q1800 Gerda Diss, DO   20 mg at 07/29/12 1730  . furosemide (LASIX) tablet 80 mg  80 mg Oral BID Placido Sou, MD   80 mg at 07/29/12 1730  . heparin injection 5,000 Units  5,000 Units Subcutaneous Q8H Josalyn Funches, MD   5,000 Units at 07/29/12 1437  . hydrALAZINE (APRESOLINE) tablet 100 mg  100 mg Oral Q8H Josalyn Funches, MD   100 mg at 07/30/12 0646  . levothyroxine (SYNTHROID, LEVOTHROID) tablet 75 mcg  75 mcg Oral QAC breakfast Alveda Reasons, MD   75 mcg at 07/29/12 [phone]  . metoprolol tartrate (LOPRESSOR) tablet 125 mg  125 mg Oral BID Tarri Fuller, PA   125 mg at 07/29/12 2128  . nitroGLYCERIN (NITROSTAT) SL tablet 0.4 mg  0.4 mg Sublingual Q5 Min x 3 PRN Gerda Diss, DO      . nitroGLYCERIN 0.2 mg/mL in dextrose 5 % infusion  2-200 mcg/min  Intravenous Titrated Tarri Fuller, PA   15 mcg/min at 07/29/12 0100  . ondansetron (ZOFRAN) injection 4 mg  4 mg Intravenous Q6H PRN Gerda Diss, DO      . sodium bicarbonate tablet 650 mg  650 mg Oral BID Alveda Reasons, MD   650 mg at 07/29/12 2128  . sodium chloride 0.9 % injection 3 mL  3 mL Intravenous Q12H Gerda Diss, DO   3 mL at 07/29/12 2129  . sodium chloride 0.9 % injection 3 mL  3 mL Intravenous PRN Gerda Diss, DO        PE: General appearance: alert, no distress, morbidly obese and  Lungs: clear to auscultation bilaterally Heart: regular rate and rhythm, S1, S2 distant, no M/R/G  Extremities: Trace LEE Neurologic: Grossly normal; flat affect; awake - but minimal acknowledgement or response. Not very cooperative.  Lab Results:   Recent Labs  07/28/12 0525  WBC 8.5  HGB 10.5*  HCT 29.6*  PLT 186   BMET  Recent Labs  07/28/12 0525 07/29/12 0515 07/30/12 0555  NA 138 138 138  K 3.9 3.7 3.9  CL 110 106 104  CO2 16* 18* 19  GLUCOSE 92 102* 97  BUN 38* 43*  61*  CREATININE 5.04* 5.23* 5.25*  CALCIUM 8.3* 8.6 8.9    Assessment/Plan  Principal Problem:   Malignant hypertension with congestive heart failure Active Problems:   Diastolic heart failure: Grade 3    Acute renal failure - suspect acute on chronic   Obesity, morbid   Hypertensive hypertrophic cardiomyopathy   Sinus tachycardia   Tobacco abuse  BP ranging 140-170's with a low of 114/85.   Amlodipine 10mg , Lopressor 125mg  bid, hydralazine 100mg  TID.   Continues to diurese on Lasix per Nephrology.  Would defer to Nephrology, but at this point, I would recommend not continuing to up-titrate BP meds to allow for her BP to stabilize on current dosing -- want to avoid hypotension which will worsen renal perfusion.  She is not having CHF Sx or Angina Sx.  She may require an ischemic evaluation that can be done as an OP.    It would appear that her primary issue is HTN & ARF -- with  Nephrology involved, [facility] will sign off for now.  She is probably ready for transfer to floor at least.   She is refusing SQ Heparin -- needs to be OOB & ambulating.  Please contact us when she is nearing d/c & we will arrange f/u with Dr. Leda Gauze or an Advanced Practitioner.    LOS: 5 days   Ashle Stief W, M.D., M.S. [facility] [address] [place], [place]  [address]  [phone] Pager # [phone] 07/30/2012 7:46 AM

## 2012-07-31 ENCOUNTER — Encounter (HOSPITAL_COMMUNITY): Payer: Self-pay | Admitting: Anesthesiology

## 2012-07-31 LAB — RENAL FUNCTION PANEL
BUN: 53 mg/dL — ABNORMAL HIGH (ref 6–23)
CO2: 18 mEq/L — ABNORMAL LOW (ref 19–32)
Chloride: 98 mEq/L (ref 96–112)
Creatinine, Ser: 5.55 mg/dL — ABNORMAL HIGH (ref 0.50–1.10)
GFR calc non Af Amer: 9 mL/min — ABNORMAL LOW (ref >90)

## 2012-07-31 LAB — CBC
Hemoglobin: 11.7 g/dL — ABNORMAL LOW (ref 12.0–15.0)
MCH: 28.7 pg (ref 26.0–34.0)
RBC: 4.07 MIL/uL (ref 3.87–5.11)

## 2012-07-31 MED ORDER — FUROSEMIDE 80 MG PO TABS
160.0000 mg | ORAL_TABLET | Freq: Two times a day (BID) | ORAL | Status: DC
  Filled 2012-07-31 (×2): qty 2

## 2012-07-31 MED ORDER — DEXTROSE 5 % IV SOLN
3.0000 g | INTRAVENOUS | Status: AC
  Administered 2012-08-01: 3 g via INTRAVENOUS
  Filled 2012-07-31: qty 3000

## 2012-07-31 MED ORDER — FUROSEMIDE 80 MG PO TABS
80.0000 mg | ORAL_TABLET | Freq: Two times a day (BID) | ORAL | Status: DC
  Administered 2012-08-01 – 2012-08-02 (×3): 80 mg via ORAL
  Filled 2012-07-31 (×6): qty 1

## 2012-07-31 MED ORDER — CEFAZOLIN SODIUM 1-5 GM-% IV SOLN: 1.0000 g | INTRAVENOUS | Status: DC

## 2012-07-31 NOTE — Progress Notes (Signed)
Pt refused to wear machine. Pt states that she does not want people continuing to ask everynight. RT encouraged pt to call if she changes her mind. RT will report to next shift.

## 2012-07-31 NOTE — Progress Notes (Signed)
FMTS Daily Intern Progress Note: pager: [phone]  Subjective:  Pt feeling ok.  Understands that vascular wants to place the AV fistula tomorrow and is amenable to this right now.    Objective Temp:  [97.4 F (36.3 C)-98.2 F (36.8 C)] 98.2 F (36.8 C) (02/27 0418) Pulse Rate:  [65-72] 72 (02/27 0418) Resp:  [20] 20 (02/27 0418) BP: (116-149)/(68-92) 142/79 mmHg (02/27 0418) SpO2:  [96 %-100 %] 96 % (02/27 0418) Weight:  [283 lb 15.2 oz (128.8 kg)-286 lb 2.5 oz (129.8 kg)] 283 lb 15.2 oz (128.8 kg) (02/27 0418)   Intake/Output Summary (Last 24 hours) at 07/31/12 0854 Last data filed at 07/31/12 0100  Gross per 24 hour  Intake    446 ml  Output   1350 ml  Net   -904 ml    General: flat affect HEENT: moist mucous membranes, PERRLA CV: [id number], RRR, 2/6 systolic murmur best heard at right sternal border  Pulm: CTA b/l, no crackles Abd: soft, non tender, non distended Ext: no edema Neuro: alert and oriented x3  Labs and Imaging  Recent Labs Lab 07/27/12 0102 07/28/12 0525 07/31/12 0610  WBC 9.9 8.5 9.8  HGB 10.4* 10.5* 11.7*  HCT 28.3* 29.6* 32.2*  PLT 175 186 251     Recent Labs Lab 07/29/12 0515 07/30/12 0555 07/31/12 0610  NA 138 138 134*  K 3.7 3.9 3.7  CL 106 104 98  CO2 18* 19 18*  BUN 43* 47* 53*  CREATININE 5.23* 5.25* 5.55*  GLUCOSE 102* 97 92  CALCIUM 8.6 8.9 8.9   Study Conclusions  - Left ventricle: The estimated ejection fraction was in the range of 50% to 55%. Indicative of decreased left ventricular diastolic compliance and/or increased left atrial pressure (grade 3 diastolic dysfunction). Doppler parameters are consistent with elevated mean left atrial filling pressure. - Mitral valve: Mild regurgitation. - Left atrium: The atrium was mildly to moderately dilated. - Right ventricle: Systolic pressure was increased. - Pulmonary arteries: PA peak pressure: 24mm Hg (S). Impressions: - The right ventricular systolic pressure was  increased consistent with mild pulmonary hypertension.  RENAL/URINARY TRACT ULTRASOUND COMPLETE  IMPRESSION:  No hydronephrosis.  Increased renal echogenicity, suggesting medical renal disease.  Bilateral pleural effusions.    Assessment and Plan 37 yo female who presented with hypertensive emergency, elevated BNP, CHF and acute likely on chronic renal failure.  # Hypertensive emergency, resolved: BP improved from admission off of drips.  Tolerated PO medication x 2 days. BP stable around 0000000 systolic  - Will continue PO meds including: Lopressor 125 mg BID, Hydralazine 100 mg TID, and Norvasc 10 mg qd.   -Continue Lasix for fluid overload and diastolic CHF exacerbation  - Will f/u with Dr. Claiborne Billings, [place] cardiology in outpatient setting.  Plan for myoview at this time    # Acute diastolic CHF exacerbation: Echo with EF: 50-55% with grade 3 diastolic dysfunction and severe LVH and mild pulmonary HTN. - Overall negative fluid balance since admission. Net output of 1.35 L past 24 hrs.  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 Creatinine 4.12 to 5.22 to 5.23 to 5.55 today   -  Per neprhology, she will need fistula for permanent dialysis - Vascular to place graft in R arm , vein mapping per vascular - Will follow nephro recs, greatly appreciate   # Hypokalemia: in setting  of lasix use: Resolved  - 3.9 this AM - monitor BMP daily - follow up renin/aldo (pending), Cortisol 9.6  # non anionic gap acidosis: stable from previous: likely from renal failure.  - continue to monitor BMP.  - Bicarb started 07/26/12  # Hypothyroid: elevated TSH at 30.4. Could be from transient elevation from non-thyroid illness in a hospitalized patient, however with level being above 20, this could likely be from true hypothyroid.  - retest after acute illness - Free T4 decreased to 0.76, Free T3  2.3  #Microcytic Anemia - Iron panel consistent with anemia of chronic disease  1) Will continue to monitor   2) IV Fe per renal  #Mood Disorder vs. cognitive impairment   -Consult to Psych, greatly appreciate recommendations   -Per psych, no medications at this time.  Believe her capacity to participate is limited   - Consider Seroquel or Lamictal in future   FEN: heart healthy PPx: heparin SQ Dispo: Pending AV fistula placement per vascular   Helmer Dull R. Awanda Mink, DO of [facility] 07/31/2012, 8:54 AM

## 2012-07-31 NOTE — Progress Notes (Signed)
Subjective: Interval History: none.  Objective: Vital signs in last 24 hours: Temp:  [97.4 F (36.3 C)-98.2 F (36.8 C)] 98.2 F (36.8 C) (02/27 0418) Pulse Rate:  [66-72] 72 (02/27 0418) Resp:  [20] 20 (02/27 0418) BP: (116-149)/(68-85) 142/79 mmHg (02/27 0418) SpO2:  [96 %-100 %] 96 % (02/27 0418) Weight:  [128.8 kg (283 lb 15.2 oz)-129.8 kg (286 lb 2.5 oz)] 128.8 kg (283 lb 15.2 oz) (02/27 0418) Weight change:   Intake/Output from previous day: 02/26 0701 - 02/27 0700 In: 646 [P.O.:640; I.V.:6] Out: 1350 [Urine:1350] Intake/Output this shift:    General appearance: slowed mentation and doesn't answer many ?, poorly interactive Resp: diminished breath sounds bilaterally Cardio: S1, S2 normal and systolic murmur: holosystolic 2/6, blowing at apex GI: obese,pos bs,liver down 5 cm Extremities: edema tr Skin: dry  Lab Results:  Recent Labs  07/31/12 0610  WBC 9.8  HGB 11.7*  HCT 32.2*  PLT 251   BMET:  Recent Labs  07/30/12 0555 07/31/12 0610  NA 138 134*  K 3.9 3.7  CL 104 98  CO2 19 18*  GLUCOSE 97 92  BUN 47* 53*  CREATININE 5.25* 5.55*  CALCIUM 8.9 8.9   No results found for this basename: PTH,  in the last 72 hours Iron Studies: No results found for this basename: IRON, TIBC, TRANSFERRIN, FERRITIN,  in the last 72 hours  Studies/Results: No results found.  I have reviewed the patient's current medications.  Assessment/Plan: 1 CKD 5 needs access. Will start bicarb 2 Htn improving 3 obesity ?? 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

## 2012-07-31 NOTE — Consult Note (Signed)
Patient Identification:  Brittney Contreras Date of Evaluation:  07/31/2012 Reason for Consult: [place]  Referring Provider:  Dr. Awanda Mink  History of Present Illness:Brittney Contreras is a 37 yo woman who says she is here because she had difficulty breathing. She was found to be short of breath in hypertensive crisis She also had pulmonary edema. She admits she has hypertension, - diagnosed 'about 6 months ago' but denies she has taken medication for it. She does not know other reasons for being here. Chart review suggests she has had HTN at least 2 yrs ago. She says she is taking medication for HTN 'as of now'.   Past Psychiatric History: Pt denies any history of depression, anxiety, or psychotic symptoms. She denies suicidal ideation but states if she ever had SI, it would be a personal matter and would never tell anyone.  She says she drinks a can of beer and at the most has drunk a 12-pack of beer. She denies use of cannabis, drugs; is smoking ~ 1-3 cigarettes. She left school after 9th grade, does not have GED.  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  Pt says her license was going to be suspended so she did not continue to work.  She does 'not remember' why her license was going to be suspended.   Does not say what she did after leaving high school. except stayed with friends; now lives with uncle and 3 cousins  Past Medical History:     Past Medical History  Diagnosis Date  . Hypertension   . Hypertensive hypertrophic cardiomyopathy 07/28/2012  . Diastolic heart failure: Grade 3  07/25/2012  . Obesity, morbid 07/25/2012  . Malignant hypertension with congestive heart failure 07/25/2012  . Tobacco abuse 07/27/2012      History reviewed. No pertinent past surgical history.  Allergies: No Known Allergies  Current Medications:  Prior to Admission medications   Not on File    Social History:    reports that she has been smoking Cigarettes.  She has been smoking about 0.00 packs per day. She does not have any smokeless  tobacco history on file. She reports that  drinks alcohol. She reports that she does not use illicit drugs.   Family History:    Family History  Problem Relation Age of Onset  . Kidney disease      Aunt, deceased, had been on dialysis  . Hypertension Mother   . Heart disease Mother     Mental Status Examination/Evaluation: Objective:  Appearance: morbidly obese  Eye Contact::  Good  Speech:  Clear and Coherent and Slow  Volume:  Normal  Mood:  Denies depression  Affect:  Blunt and but smiling  Thought Process:  Loose and limited  Orientation:  Other:  oriented to self and situation  Thought Content:  very guarded, unable to say  Suicidal Thoughts:  No  Homicidal Thoughts:  No  Judgement:  Fair  Insight:  Lacking   DIAGNOSIS:   AXIS I  Borderline Intellectual Functioning  AXIS II  Deferred  AXIS III See medical notes.  AXIS IV economic problems, housing problems, other psychosocial or environmental problems, problems related to social environment and Problems with social skills, ability to function ouside of uncle's hom  AXIS V 41-50 serious symptoms   Assessment/Plan:  Discussed with Dr. Awanda Mink, Psych CSW Pt is lying in bed, no apparent distress.  She greets with a smile.  She knows the fistula will be placed tomorrow.  She has no questions about it.  She just  had her BP measured:~ 90/58 which she recognizes as good.   She says she sleeps well.       Will follow pt.  Asked pt permission to call uncle.  She did not answer but says her uncle works at [employer] from 7 am to 7 pm. RECOMMENDATION:  1.  Pt does not have full capacity, neglected to take anti-hypetensive medications 2.  Pt does not express understanding of the process of fistula or HD; she does know she will have the fistula places in her arm tomorrow.  3.  Will follow pt.  Arthea Nobel MD 07/31/2012 9:04 AM

## 2012-07-31 NOTE — Progress Notes (Signed)
Pt stepmother Pam called for update on pt today. Pt gave RN permission to discuss pt situation with Jeannene Patella. Elyn Aquas A RN 07/31/2012

## 2012-07-31 NOTE — Progress Notes (Signed)
Family Medicine Teaching Service Attending Note  I interviewed and examined patient Brittney Contreras and reviewed their tests and x-rays.  I discussed with Dr. Awanda Mink and reviewed their note for today.  I agree with their assessment and plan.     Additionally  Very withdrawn at noon today.  Would not discuss her situation and plans.  Denies depression or suicidal ideation Proceed with current plans

## 2012-07-31 NOTE — Progress Notes (Signed)
Chaplain Note:  Chaplain visited with pt who was resting in bed, awake, and alert.  It was difficult to judge pt's level of orientation.  Pt was taciturn and stated that she did not wish to talk.  Chaplain stated that should the pt have spiritual matters that she needed to address, chaplain services were available round the clock.  Chaplain will follow up as needed.  07/31/12 1100  Clinical Encounter Type  Visited With Patient  Visit Type Spiritual support  Referral From Physician  Spiritual Encounters  Spiritual Needs Other (Comment) (Pt did not indicate any spiritual concerns)  Stress Factors  Patient Stress Factors Major life changes  Family Stress Factors None identified (No family present)  Jearld Lesch, Chaplain [phone]

## 2012-07-31 NOTE — Progress Notes (Signed)
Clinical Social Work Progress Note PSYCHIATRY SERVICE LINE 07/31/2012  Patient:  CAIDANCE GELABERT  Account:  [id number]  [place] Date:  07/25/2012  Clinical Social Worker:  Sindy Messing, LCSW  Date/Time:  07/31/2012 03:45 PM  Review of Patient  Overall Medical Condition:   Patient reports she is going to be on dialysis now.   Participation Level:  Minimal  Participation Quality  Guarded   Other Participation Quality:   Patient reading "Patient Guide" booklet and avoided eye contact   Affect  Flat   Cognitive  Alert   Reaction to Medications/Concerns:   None reported   Modes of Intervention  Support   Summary of Progress/Plan at Discharge   CSW met with patient at bedside. Patient reading booklet and minimally engaged throughout assessment. Patient reports no family visiting and has not spoken with them via phone because they have been busy working.    CSW inquired about patient's stay and her feelings regarding HD. CSW and patient discussed how patient could benefit from talking to informal supportive regarding emotions. Patient does not feel she can talk with family because they are too busy at this time. Patient reports it is "annoying" that she has to have HD but that "it will be fine."    CSW will staff case with psych MD and follow any recommendations provided.

## 2012-07-31 NOTE — Progress Notes (Signed)
Pt refused labs.  Notified MD.

## 2012-08-01 ENCOUNTER — Encounter (HOSPITAL_COMMUNITY): Payer: Self-pay | Admitting: Anesthesiology

## 2012-08-01 ENCOUNTER — Inpatient Hospital Stay (HOSPITAL_COMMUNITY): Payer: Medicaid Other | Admitting: Anesthesiology

## 2012-08-01 ENCOUNTER — Encounter (HOSPITAL_COMMUNITY)
Admission: EM | Disposition: A | Discharge status: Home / Self Care | Payer: Self-pay | Source: Home / Self Care | Attending: Family Medicine

## 2012-08-01 ENCOUNTER — Other Ambulatory Visit: Payer: Self-pay | Admitting: *Deleted

## 2012-08-01 ENCOUNTER — Telehealth: Payer: Self-pay | Admitting: Vascular Surgery

## 2012-08-01 DIAGNOSIS — T82898A Other specified complication of vascular prosthetic devices, implants and grafts, initial encounter: Secondary | ICD-10-CM

## 2012-08-01 DIAGNOSIS — N186 End stage renal disease: Secondary | ICD-10-CM

## 2012-08-01 DIAGNOSIS — Z4931 Encounter for adequacy testing for hemodialysis: Secondary | ICD-10-CM

## 2012-08-01 HISTORY — PX: AV FISTULA PLACEMENT: SHX1204

## 2012-08-01 LAB — RENAL FUNCTION PANEL
Albumin: 3.3 g/dL — ABNORMAL LOW (ref 3.5–5.2)
CO2: 19 mEq/L (ref 19–32)
Chloride: 101 mEq/L (ref 96–112)
GFR calc Af Amer: 9 mL/min — ABNORMAL LOW (ref >90)
GFR calc non Af Amer: 8 mL/min — ABNORMAL LOW (ref >90)
Potassium: 3.7 mEq/L (ref 3.5–5.1)

## 2012-08-01 LAB — ALDOSTERONE + RENIN ACTIVITY W/ RATIO
ALDO / PRA Ratio: 4.4 Ratio (ref 0.9–28.9)
Aldosterone: 5 ng/dL
PRA LC/MS/MS: 1.13 ng/mL/h (ref 0.25–5.82)

## 2012-08-01 LAB — CBC
Hemoglobin: 12.2 g/dL (ref 12.0–15.0)
Platelets: 277 10*3/uL (ref 150–400)
RBC: 4.21 MIL/uL (ref 3.87–5.11)
WBC: 10.9 10*3/uL — ABNORMAL HIGH (ref 4.0–10.5)

## 2012-08-01 SURGERY — ARTERIOVENOUS (AV) FISTULA CREATION
Anesthesia: Monitor Anesthesia Care | Site: Arm Lower | Laterality: Right | Wound class: Clean

## 2012-08-01 MED ORDER — LIDOCAINE HCL 4 % MT SOLN
OROMUCOSAL | Status: DC | PRN
  Administered 2012-08-01: 4 mL via TOPICAL

## 2012-08-01 MED ORDER — SODIUM CHLORIDE 0.9 % IR SOLN
Status: DC | PRN
  Administered 2012-08-01: 11:00:00

## 2012-08-01 MED ORDER — EPHEDRINE SULFATE 50 MG/ML IJ SOLN
INTRAMUSCULAR | Status: DC | PRN
  Administered 2012-08-01 (×3): 5 mg via INTRAVENOUS
  Administered 2012-08-01: 10 mg via INTRAVENOUS

## 2012-08-01 MED ORDER — SODIUM CHLORIDE 0.9 % IV SOLN
INTRAVENOUS | Status: DC
  Administered 2012-08-01: 35 mL/h via INTRAVENOUS

## 2012-08-01 MED ORDER — MORPHINE SULFATE 2 MG/ML IJ SOLN
1.0000 mg | INTRAMUSCULAR | Status: DC | PRN
  Administered 2012-08-01: 1 mg via INTRAVENOUS
  Filled 2012-08-01: qty 1

## 2012-08-01 MED ORDER — LIDOCAINE HCL (CARDIAC) 20 MG/ML IV SOLN
INTRAVENOUS | Status: DC | PRN
  Administered 2012-08-01: 70 mg via INTRAVENOUS

## 2012-08-01 MED ORDER — OXYCODONE HCL 5 MG PO TABS
ORAL_TABLET | ORAL | Status: AC
  Filled 2012-08-01: qty 2

## 2012-08-01 MED ORDER — HYDRALAZINE HCL 50 MG PO TABS
100.0000 mg | ORAL_TABLET | Freq: Two times a day (BID) | ORAL | Status: DC
  Administered 2012-08-01 – 2012-08-02 (×2): 100 mg via ORAL
  Filled 2012-08-01 (×3): qty 2

## 2012-08-01 MED ORDER — MEPERIDINE HCL 25 MG/ML IJ SOLN: 6.2500 mg | INTRAMUSCULAR | Status: DC | PRN

## 2012-08-01 MED ORDER — OXYCODONE HCL 5 MG PO TABS: 5.0000 mg | ORAL_TABLET | Freq: Once | ORAL | Status: AC | PRN

## 2012-08-01 MED ORDER — OXYCODONE HCL 5 MG PO TABS
5.0000 mg | ORAL_TABLET | ORAL | Status: DC | PRN
  Administered 2012-08-01: 10 mg via ORAL

## 2012-08-01 MED ORDER — SODIUM CHLORIDE 0.9 % IV SOLN
INTRAVENOUS | Status: DC | PRN
  Administered 2012-08-01: 11:00:00 via INTRAVENOUS

## 2012-08-01 MED ORDER — OXYCODONE HCL 5 MG/5ML PO SOLN: 5.0000 mg | Freq: Once | ORAL | Status: AC | PRN

## 2012-08-01 MED ORDER — SUCCINYLCHOLINE CHLORIDE 20 MG/ML IJ SOLN
INTRAMUSCULAR | Status: DC | PRN
  Administered 2012-08-01: 100 mg via INTRAVENOUS

## 2012-08-01 MED ORDER — PHENYLEPHRINE HCL 10 MG/ML IJ SOLN
INTRAMUSCULAR | Status: DC | PRN
  Administered 2012-08-01: 80 ug via INTRAVENOUS
  Administered 2012-08-01: 40 ug via INTRAVENOUS
  Administered 2012-08-01: 80 ug via INTRAVENOUS
  Administered 2012-08-01: 40 ug via INTRAVENOUS
  Administered 2012-08-01 (×2): 80 ug via INTRAVENOUS
  Administered 2012-08-01: 40 ug via INTRAVENOUS
  Administered 2012-08-01: 80 ug via INTRAVENOUS

## 2012-08-01 MED ORDER — ONDANSETRON HCL 4 MG/2ML IJ SOLN
INTRAMUSCULAR | Status: DC | PRN
  Administered 2012-08-01: 4 mg via INTRAVENOUS

## 2012-08-01 MED ORDER — HYDROMORPHONE HCL PF 1 MG/ML IJ SOLN
0.2500 mg | INTRAMUSCULAR | Status: DC | PRN
  Administered 2012-08-01 (×2): 0.5 mg via INTRAVENOUS

## 2012-08-01 MED ORDER — ARTIFICIAL TEARS OP OINT
TOPICAL_OINTMENT | OPHTHALMIC | Status: DC | PRN
  Administered 2012-08-01: 1 via OPHTHALMIC

## 2012-08-01 MED ORDER — PROMETHAZINE HCL 25 MG/ML IJ SOLN
6.2500 mg | INTRAMUSCULAR | Status: DC | PRN
  Filled 2012-08-01: qty 1

## 2012-08-01 MED ORDER — METOPROLOL TARTRATE 100 MG PO TABS
100.0000 mg | ORAL_TABLET | Freq: Two times a day (BID) | ORAL | Status: DC
  Administered 2012-08-01 – 2012-08-02 (×2): 100 mg via ORAL
  Filled 2012-08-01 (×3): qty 1

## 2012-08-01 MED ORDER — HEPARIN SODIUM (PORCINE) 1000 UNIT/ML IJ SOLN
INTRAMUSCULAR | Status: DC | PRN
  Administered 2012-08-01: 3000 [IU] via INTRAVENOUS

## 2012-08-01 MED ORDER — LIDOCAINE-EPINEPHRINE (PF) 1 %-1:200000 IJ SOLN
INTRAMUSCULAR | Status: AC
  Filled 2012-08-01: qty 10

## 2012-08-01 MED ORDER — MIDAZOLAM HCL 5 MG/5ML IJ SOLN
INTRAMUSCULAR | Status: DC | PRN
  Administered 2012-08-01 (×2): 1 mg via INTRAVENOUS

## 2012-08-01 MED ORDER — PROPOFOL 10 MG/ML IV BOLUS
INTRAVENOUS | Status: DC | PRN
  Administered 2012-08-01: 175 mg via INTRAVENOUS
  Administered 2012-08-01: 25 mg via INTRAVENOUS

## 2012-08-01 MED ORDER — FENTANYL CITRATE 0.05 MG/ML IJ SOLN
INTRAMUSCULAR | Status: DC | PRN
  Administered 2012-08-01 (×5): 50 ug via INTRAVENOUS

## 2012-08-01 MED ORDER — 0.9 % SODIUM CHLORIDE (POUR BTL) OPTIME
TOPICAL | Status: DC | PRN
  Administered 2012-08-01: 1000 mL

## 2012-08-01 MED ORDER — HYDROMORPHONE HCL PF 1 MG/ML IJ SOLN
INTRAMUSCULAR | Status: AC
  Filled 2012-08-01: qty 1

## 2012-08-01 SURGICAL SUPPLY — 43 items
ADH SKN CLS APL DERMABOND .7 (GAUZE/BANDAGES/DRESSINGS) ×2
BLADE SURG 11 STRL SS (BLADE) ×2 IMPLANT
CANISTER SUCTION 2500CC (MISCELLANEOUS) ×2 IMPLANT
CATH EMB 3FR 80CM (CATHETERS) ×1 IMPLANT
CLIP TI MEDIUM 6 (CLIP) ×3 IMPLANT
CLIP TI WIDE RED SMALL 6 (CLIP) ×4 IMPLANT
CLOTH BEACON ORANGE TIMEOUT ST (SAFETY) ×2 IMPLANT
COVER PROBE W GEL 5X96 (DRAPES) ×1 IMPLANT
COVER SURGICAL LIGHT HANDLE (MISCELLANEOUS) ×2 IMPLANT
DERMABOND ADVANCED (GAUZE/BANDAGES/DRESSINGS) ×2
DERMABOND ADVANCED .7 DNX12 (GAUZE/BANDAGES/DRESSINGS) ×1 IMPLANT
DRAIN PENROSE 1/4X12 LTX STRL (WOUND CARE) ×1 IMPLANT
ELECT REM PT RETURN 9FT ADLT (ELECTROSURGICAL) ×2
ELECTRODE REM PT RTRN 9FT ADLT (ELECTROSURGICAL) ×1 IMPLANT
GEL ULTRASOUND 20GR AQUASONIC (MISCELLANEOUS) IMPLANT
GLOVE BIO SURGEON STRL SZ 6.5 (GLOVE) ×1 IMPLANT
GLOVE BIOGEL PI IND STRL 7.0 (GLOVE) IMPLANT
GLOVE BIOGEL PI IND STRL 8 (GLOVE) IMPLANT
GLOVE BIOGEL PI INDICATOR 7.0 (GLOVE) ×3
GLOVE BIOGEL PI INDICATOR 8 (GLOVE) ×1
GLOVE ECLIPSE 7.0 STRL STRAW (GLOVE) ×1 IMPLANT
GLOVE SS BIOGEL STRL SZ 7 (GLOVE) ×1 IMPLANT
GLOVE SUPERSENSE BIOGEL SZ 7 (GLOVE) ×1
GLOVE SURG SS PI 7.0 STRL IVOR (GLOVE) ×1 IMPLANT
GLOVE SURG SS PI 7.5 STRL IVOR (GLOVE) ×1 IMPLANT
GOWN PREVENTION PLUS XLARGE (GOWN DISPOSABLE) ×1 IMPLANT
GOWN STRL NON-REIN LRG LVL3 (GOWN DISPOSABLE) ×5 IMPLANT
GOWN STRL REIN XL XLG (GOWN DISPOSABLE) ×1 IMPLANT
KIT BASIN OR (CUSTOM PROCEDURE TRAY) ×2 IMPLANT
KIT ROOM TURNOVER OR (KITS) ×2 IMPLANT
NS IRRIG 1000ML POUR BTL (IV SOLUTION) ×2 IMPLANT
PACK CV ACCESS (CUSTOM PROCEDURE TRAY) ×2 IMPLANT
PAD ARMBOARD 7.5X6 YLW CONV (MISCELLANEOUS) ×4 IMPLANT
SPONGE GAUZE 4X4 12PLY (GAUZE/BANDAGES/DRESSINGS) ×1 IMPLANT
SPONGE LAP 18X18 X RAY DECT (DISPOSABLE) ×1 IMPLANT
SUT PROLENE 6 0 BV (SUTURE) ×4 IMPLANT
SUT VIC AB 3-0 SH 27 (SUTURE) ×6
SUT VIC AB 3-0 SH 27X BRD (SUTURE) ×1 IMPLANT
SYR TB 1ML LUER SLIP (SYRINGE) ×1 IMPLANT
TOWEL OR 17X24 6PK STRL BLUE (TOWEL DISPOSABLE) ×2 IMPLANT
TOWEL OR 17X26 10 PK STRL BLUE (TOWEL DISPOSABLE) ×2 IMPLANT
UNDERPAD 30X30 INCONTINENT (UNDERPADS AND DIAPERS) ×2 IMPLANT
WATER STERILE IRR 1000ML POUR (IV SOLUTION) ×2 IMPLANT

## 2012-08-01 NOTE — Telephone Encounter (Addendum)
Message copied by Doristine Section on Fri Aug 01, 2012  1:55 PM ------      Message from: Richrd Prime      Created: Fri Aug 01, 2012 12:10 PM       6 week AVF F/U - [facility]      Pt will need duplex of right arm R-C AVF ------  l/v/m at emergency #'s mailed appt. letter to notify patient of fu appt. on 09-02-12 at 9 am with dr. Kellie Simmering

## 2012-08-01 NOTE — Progress Notes (Signed)
[facility]MT[facility] Daily Intern Progress Note: pager: [phone]  Subjective:  Pt feeling ok.  Going for AV fistula in R arm today per vascular    Objective Temp:  [97.7 F (36.5 C)-98.7 F (37.1 C)] 98.7 F (37.1 C) (02/28 0749) Pulse Rate:  [62-72] 68 (02/28 0749) Resp:  [16-20] 16 (02/28 0749) BP: (91-165)/(55-94) 117/69 mmHg (02/28 0749) SpO2:  [93 %-98 %] 93 % (02/28 0749) Weight:  [281 lb 9.6 oz (127.733 kg)] 281 lb 9.6 oz (127.733 kg) (02/28 0510)   Intake/Output Summary (Last 24 hours) at 08/01/12 0856 Last data filed at 08/01/12 [id number]  Gross per 24 hour  Intake    700 ml  Output   2450 ml  Net  -1750 ml    General: flat affect HEENT: moist mucous membranes, PERRLA CV: [id number], RRR, 2/6 systolic murmur best heard at right sternal border  Pulm: CTA b/l, no crackles Abd: soft, non tender, non distended Ext: no edema Neuro: alert and oriented x3  Labs and Imaging  Recent Labs Lab 07/28/12 0525 07/31/12 0610 08/01/12 0500  WBC 8.5 9.8 10.9*  HGB 10.5* 11.7* 12.2  HCT 29.6* 32.2* 33.3*  PLT 186 251 277     Recent Labs Lab 07/30/12 0555 07/31/12 0610 08/01/12 0500  NA 138 134* 136  K 3.9 3.7 3.7  CL 104 98 101  CO2 19 18* 19  BUN 47* 53* 61*  CREATININE 5.25* 5.55* 6.04*  GLUCOSE 97 92 100*  CALCIUM 8.9 8.9 8.6   Study Conclusions  - Left ventricle: The estimated ejection fraction was in the range of 50% to 55%. Indicative of decreased left ventricular diastolic compliance and/or increased left atrial pressure (grade 3 diastolic dysfunction). Doppler parameters are consistent with elevated mean left atrial filling pressure. - Mitral valve: Mild regurgitation. - Left atrium: The atrium was mildly to moderately dilated. - Right ventricle: Systolic pressure was increased. - Pulmonary arteries: PA peak pressure: 58mm Hg (S). Impressions: - The right ventricular systolic pressure was increased consistent with mild pulmonary hypertension.  RENAL/URINARY TRACT  ULTRASOUND COMPLETE  IMPRESSION:  No hydronephrosis.  Increased renal echogenicity, suggesting medical renal disease.  Bilateral pleural effusions.    Assessment and Plan 37 yo female who presented with hypertensive emergency, elevated BNP, CHF and acute likely on chronic renal failure.  # Hypertensive emergency, resolved: BP improved from admission off of drips.  Tolerated PO medication x 2 days. BP stable around [id number] systolic  - Will continue PO meds including: Lopressor 125 mg BID, Hydralazine 100 mg TID, and Norvasc 10 mg qd.   -Continue Lasix for fluid overload and diastolic CHF exacerbation  - Will f/u with Dr. Claiborne Billings, [place] [facility] in outpatient setting.  Plan for myoview at this time    # Acute diastolic CHF exacerbation: Echo with EF: 50-55% with grade 3 diastolic dysfunction and severe LVH and mild pulmonary HTN. - Overall negative fluid balance since admission. Net output of 2,4 L past 24 hrs.  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 Creatinine 4.12 to 5.22 to 5.23 to 6.04 -  Per neprhology, she will need fistula for permanent dialysis - Vascular to place graft in R arm on 08/01/12 , vein mapping per vascular - Will follow nephro recs, greatly appreciate   # Hypokalemia: in setting of lasix use: Resolved  - 3.9 this AM - monitor BMP daily - follow  up renin/aldo (pending), Cortisol 9.6  # non anionic gap acidosis: stable from previous: likely from renal failure.  - continue to monitor BMP.  - Bicarb started 07/26/12  # Hypothyroid: elevated TSH at 30.4. Could be from transient elevation from non-thyroid illness in a hospitalized patient, however with level being above 20, this could likely be from true hypothyroid.  - retest after acute illness - Free T4 decreased to 0.76, Free T3 2.3  #Microcytic Anemia - Iron panel consistent with anemia of chronic  disease  1) Will continue to monitor   2) IV Fe per renal  #Mood Disorder vs. cognitive impairment   -Consult to Psych, greatly appreciate recommendations   -Per psych, no medications at this time.  Believe her capacity to participate is limited   - Consider Seroquel or Lamictal in future   FEN: heart healthy PPx: heparin SQ Dispo: Pending AV fistula placement per vascular   Camrin Lapre R. Awanda Mink, DO of [facility] 08/01/2012, 8:56 AM

## 2012-08-01 NOTE — Progress Notes (Signed)
Family Medicine Teaching Service Attending Note  I interviewed and examined patient Brittney Contreras and reviewed their tests and x-rays.  I discussed with Dr. Awanda Mink and reviewed their note for today.  I agree with their assessment and plan.     Additionally  Minimally communicative this AM but agreed to fistula placement

## 2012-08-01 NOTE — Progress Notes (Signed)
PT request that she not be bothered about CPAP again. She has expressed to many people that she is not interested in wearing and would like that to be passed on. PT get frustrated when she is asked.

## 2012-08-01 NOTE — H&P (Signed)
  Pt seen and examined. Still has IVs in 2 locations on right arm. Vein map reviewed. Has thrombosis of left cephalic vein. Right cephalic vein of reasonable quality for fistula.  Plan is for right radial cephalic fistula on Friday by Dr Kellie Simmering (vein is 3-5 mm)  Procedure risks and benefits discussed with pt. She currently agrees to proceed.  Please move all IVs out of right arm and place restricted band  NPO p midnight Thursday  Consent.  Ruta Hinds, MD  [facility]  Office: [phone]  Pager: [phone]

## 2012-08-01 NOTE — Anesthesia Preprocedure Evaluation (Signed)
Anesthesia Evaluation  Patient identified by MRN, date of birth, ID band Patient awake  General Assessment Comment:aPoor historian, no real med care previoulsy  Reviewed: Allergy & Precautions, H&P , NPO status , Patient's Chart, lab work & pertinent test results  Airway Mallampati: I  Neck ROM: Full    Dental  (+) Teeth Intact   Pulmonary Current Smoker,    + decreased breath sounds      Cardiovascular hypertension, +CHF Rhythm:Regular Rate:Normal  Malignant HTN, CHF   Neuro/Psych negative neurological ROS     GI/Hepatic negative GI ROS, Neg liver ROS,   Endo/Other  Morbid obesity  Renal/GU Renal disease     Musculoskeletal   Abdominal (+) + obese,   Peds  Hematology   Anesthesia Other Findings   Reproductive/Obstetrics                           Anesthesia Physical Anesthesia Plan  ASA: III  Anesthesia Plan: MAC and General   Post-op Pain Management:    Induction: Intravenous  Airway Management Planned: Oral ETT  Additional Equipment:   Intra-op Plan:   Post-operative Plan: Extubation in OR  Informed Consent: I have reviewed the patients History and Physical, chart, labs and discussed the procedure including the risks, benefits and alternatives for the proposed anesthesia with the patient or authorized representative who has indicated his/her understanding and acceptance.   Dental advisory given  Plan Discussed with: CRNA and Surgeon  Anesthesia Plan Comments:         Anesthesia Quick Evaluation

## 2012-08-01 NOTE — Op Note (Signed)
OPERATIVE REPORT  Date of Surgery: 07/25/2012 - 08/01/2012  Surgeon: [name], MD  Assistant: Johnette Abraham  Pre-op Diagnosis: End Stage Renal Disease  Post-op Diagnosis: End Stage Renal Disease  Procedure: Procedure(s): #1 creation of right radial-cephalic AV fistula #2 thrombectomy of right  radial-cephalic AV fistula #3 ligation of right radial-cephalic AV fistula #4 creation of right the right brachial-cephalic AV fistula  Patient was taken to the operating room placed in the supine position and the Right upper extremity was prepped with Betadine scrub and solution draped in a routine sterile manner. Longitudinal incision was made midway between the radial artery and cephalic vein just proximal to the wrist. Radial artery was exposed it was a 2-1/2 mm vessel with a good pulse. Cephalic vein is about 123456 mm in size borderline but thought to possibly be adequate for AV fistula. I branches were ligated with 3-0 silk ties and divided. He vein was gently dilated with heparinized saline. The artery was then occluded proximally and distally with vessel loops opened with 15 blade extended with Potts scissors. 2-1/2 dilator went easily traversed the artery. The vein was carefully measured spatulated and anastomosed end-to-side with 6-0 Prolene. Vesseloops released there was initially a pulse and palpable thrill in the fistula and an audible Doppler flow up to the antecubital area which was not dynamic period wound was closed with layers of Vicryl subcuticular fashion at that point it was noted that the fistula had probably thrombosed since there was no audible flow. Wound was reopened and there was no flow in the fistula. Small opening was made in the hood of the anastomosis and the vein 11 blade there was some thrombus in the vein. 3 Fogarty was passed up the vein 1 return thrombus was removed. A review was also passed up the artery proximally and distally and excellent inflow reestablished.  The opening in the hood of the vein was reclosed with continuous 6-0 Prolene suture once again I examined the fistula and there was very poor flow. It was felt that the vein was too small therefore the fistula was ligated at the arterial anastomosis to preserve good arterial flow. Attention turned to the antecubital area transverse incision was made antecubital vein dissected free. Cephalic branch was about 3-3-1/2 mm in size dissected free distally ligated with 2-0 silk tie and transected. Brachial artery was encircled with Vesseloops an excellent pulse and good size. It was opened 15 blade extended with Potts scissors after given 3000 units of heparin intravenously. He was carefully measured spatulated and anastomosed end to side with 6-0 Prolene. Vesseloops released there was good pulse and palpable thrill and good Doppler flow in the fistula. Adequate hemostasis was achieved wounds closed in layers with Vicryl in subcuticular fashion with Dermabond patient taken to the recovery room in stable condition t brachial-cephalic AV fistula Anesthesia: Gen.  EBL: Minimal  Complications: Henrietta Dine, MD 08/01/2012 1:54 PM

## 2012-08-01 NOTE — Discharge Summary (Signed)
Physician Discharge Summary  Patient ID: Brittney Contreras MRN: [id number] DOB: 02/10/1976 Age: 37 y.o.  Admit date: 07/25/2012 Discharge date: 08/01/2012 Admitting Physician: Alveda Reasons, MD  PCP: Default, Provider, MD  Consultants:Nephrology ( Dr. Jimmy Footman), Vascular ( Dr. Oneida Alar), Cardiology [facility] cardiology), Psychiatry ( Dr. Evern Bio)      Discharge Diagnosis: Principal Problem:   Malignant hypertension with congestive heart failure Active Problems:   Diastolic heart failure: Grade 3    Obesity, morbid   Sinus tachycardia   Tobacco abuse   Hypertensive hypertrophic cardiomyopathy   CKD (chronic kidney disease) stage 5, GFR less than 15 ml/min   Elevated d-dimer    Hospital Course Ms. Brittney Contreras is a 37 y/o female who presented to the ED with worsening dyspnea at rest and exertion found to have malignant hypertension with renal and cardiac involvement.    1) NSTEMI, Acute on Chronic Diastolic CHF, and AKI on CKD Stage V secondary to Malignant HTN - Pt presented to the ED and found to have BP elevated to 222/147.  An EKG was performed that showed inverted T waves and a POC troponin was elevated and troponins were cycled and negative x 3.  A BNP was also performed and elevated to 20,9046, a BMET showing a Creatinine of 4.03, and a CXR showing interstitial pulmonary edema but not flash pulmonary edema.  Pt was admitted to the ICU and was given chewable ASA, started on a Heparin, Labetalol, Nitroglycerin and hydralazine drips and cardiology was consulted for further evaluation.  They recommended starting her on IV lasix 80 mg BID for CHF and talking to renal about her AKI on most likely CKD along with getting risk stratification labs and a 2 D Echo.  Nephrology evaluated the patient and felt that if her serum creatinine did not decrease over the following couple of days, she would require an AV fistula for HD.  Labs and BP were followed very closely over her hospital stay and her creatinine  continued to elevate, eventually to 6.0.  Her A1C was 5.2, TSH was elevated to 30, and LFTs were normal along with a direct LDL of 139 and her 2 D Echo showed Grade 3 diastolic dysfunction.  Her creatinine remained elevated without trending down and vascular surgery was consulted for vein mapping and possible AV fistula placement.  Her Lasix was transitioned from IV to PO and continued despite elevating creatinine since pt would most likely need dialysis.  Cardiology stopped her Heparin drip 72 hrs after admission and slowly titrated her to oral medications and off her drips. She was transitioned to Norvasc 10 mg, Metoprolol 100 mg BID and eventually to 125 mg BID, and hydralazine 100 mg TID.  She was observed in SDU for one day while this was going on and her BP were stable between 0000000 systolic to 123456 systolic.  Pt continued to diurese and had a total output of 7.5 L prior to d/c.  Renal felt that she would need dialysis and vascular surgery was consulted for vein mapping and AV fistula placement.  Vascular surgery recommended AV fistula placement in the R arm and performed the procedure without complications.  The fistula site was intact, non erythematous, non tender, no edematous, and no evidence of hematoma prior to d/c.    2) AKI on Stage 5 CKD requiring AV fistula placement and eventual HD - Please see Problem #1. Briefly, pt with elevated creatinine that continued to get worse.  Renal felt she would need HD and pt  was agreeable to this.  An AV fistula was placed per vascular and will be getting HD in the outpatient setting.  Pt will require epo in the outpatient setting as well and told to f/u in 2 weeks.   3) Acute on Chronic diastolic CHF - Please see Problem #1.  Briefly, pt was initially started on drips to bring down her BP.  These were transitioned to PO medications including Metoprolol, Hydralazine, and Norvasc.    4) Normocytic anemia secondary to CKD - Pt found to have Hgb of 10.5 on  admission.  Nephrology felt this was secondary to her CKD and checked iron studies.    5) OSA - Pt with large body habitus and Mallampati score of 4.  She was started on CPAP and titrated while in the hospital but did not want this multiple nights.   6) Obesity - Pt with morbid obesity.  Placed on carb modified diet while in the hospital.   7) Tobacco Abuse - Stable, discussed cessation with patient.    8) Leukocytosis - Pt found to have elevated WBC to 12.1 on admission.  This trended down and was most likely secondary to acute stress.   9) Hypokalemia - Pt found to have K+ of 3.0 and felt that this was lasix induced, and a Mg was normal.  She did have renin/aldosterone studies performed and were normal.  She was repleted with IV K+ and her K+ remained stable during her hospital stay.    10) Non Anion gap Acidosis - Most likely secondary to renal failure.  Renal started the pt on Bicarb 650 mg BID and her labs were stable at d/c.   11) Decreased TSH, possibly from Hypothyroidism - Pt found to have a TSH of 30.4, Free T4 to 0.76, and Free T3 of 2.3.  Since she had an acute illness, this could not be determined to be from a chronic hypothyroid state.  She was not started on Synthroid while in the hospital.    12) Mood Disorder vs Cognitive Impairment - Pt found to have very flat affect and at times non understanding of her condition.  Psychiatry was consulted after discussion with both renal and cardiology and psychiatry evaluated the patient.  They felt that she had a possible cognitive impairment and did not want to start medications on her. Throughout her hospital stay, no SI/HI and stable at d/c.   13) Elevated D-Dimer - Pt found to have elevated D-Dimer on admission and a VQ scan was performed since pt was not a candidate for CTA due to her creatinine.  Her VQ scan was negative and r/o possibility of a PE.          Discharge PE   Filed Vitals:   08/01/12 0749  BP: 117/69  Pulse: 68   Temp: 98.7 F (37.1 C)  Resp: 16   General: flat affect  HEENT: moist mucous membranes, PERRLA  CV: [id number], RRR, 2/6 systolic murmur best heard at right sternal border  Pulm: CTA b/l, no crackles  Abd: soft, non tender, non distended  Ext: no TTP at AV fistula site, no hematoma or inflammation present Neuro: alert and oriented x3      Procedures/Imaging:  Dg Chest 2 View  07/25/2012    IMPRESSION: 1.  Borderline cardiomegaly with findings suspicious for mild interstitial pulmonary edema.  Clinical correlation for signs and symptoms of mild congestive heart failure is recommended.   Original Report Authenticated By: Vinnie Langton, M.D.  US Renal  07/26/2012   IMPRESSION: No hydronephrosis.  Increased renal echogenicity, suggesting medical renal disease.  Bilateral pleural effusions.   Original Report Authenticated By: Abigail Miyamoto, M.D.    Nm Pulmonary Perf And Vent  07/25/2012    IMPRESSION: No evidence of pulmonary embolism.   Original Report Authenticated By: Abigail Miyamoto, M.D.    Echo 07/28/12 Study Conclusions  - Left ventricle: The estimated ejection fraction was in the range of 50% to 55%. Indicative of decreased left ventricular diastolic compliance and/or increased left atrial pressure (grade 3 diastolic dysfunction). Doppler parameters are consistent with elevated mean left atrial filling pressure. - Mitral valve: Mild regurgitation. - Left atrium: The atrium was mildly to moderately dilated. - Right ventricle: Systolic pressure was increased. - Pulmonary arteries: PA peak pressure: 6mm Hg (S). Impressions: - The right ventricular systolic pressure was increased consistent with mild pulmonary hypertension.   Labs  CBC  Recent Labs Lab 07/28/12 0525 07/31/12 0610 08/01/12 0500  WBC 8.5 9.8 10.9*  HGB 10.5* 11.7* 12.2  HCT 29.6* 32.2* 33.3*  PLT 186 251 277   BMET  Recent Labs Lab 07/26/12 0330 07/26/12 1050  07/29/12 0515 07/30/12 0555  07/31/12 0610 08/01/12 0500  NA 138  --   < > 138 138 134* 136  K 2.8* 3.0*  < > 3.7 3.9 3.7 3.7  CL 106  --   < > 106 104 98 101  CO2 17*  --   < > 18* 19 18* 19  BUN 34*  --   < > 43* 47* 53* 61*  CREATININE 4.12*  --   < > 5.23* 5.25* 5.55* 6.04*  CALCIUM 7.8*  --   < > 8.6 8.9 8.9 8.6  PROT  --  5.9*  --  6.8  --   --   --   BILITOT  --  0.4  --  0.3  --   --   --   ALKPHOS  --  64  --  72  --   --   --   ALT  --  12  --  9  --   --   --   AST  --  17  --  13  --   --   --   GLUCOSE 126*  --   < > 102* 97 92 100*  < > = values in this interval not displayed.  HEPATITIS B SURFACE ANTIGEN     Status: None   Collection Time    07/30/12  5:55 AM      Result Value Range   Hepatitis B Surface Ag NEGATIVE  NEGATIVE       Patient condition at time of discharge/disposition: stable  Disposition-home   Follow up issues: 1. Compliance and understanding of her conditions - Pt with previous dx HTN and no follow up.  Now has CKD requiring dialysis and CHF.  She will need to follow up with vascular, renal, and cardiology within the next 6 weeks.   2. Myoview per cards in outpatient setting 3. Epo shots per renal 4. BP monitoring. 5. OSA - Will need outpatient sleep study and CPAP most likely.  6. Obesity - Will need RD in outpatient and massive overhaul of lifestyle 7. Repeat TSH/T4, may need synthroid at that time   Discharge follow up:  Follow-up Information   Follow up with HAGER, Melinna Linarez, PA. (office will call)    Contact information:   [address]  [place] [address] [phone]       Follow up with [name], MD In 6 weeks. (office will arrange -sent)    Contact information:   [address] [place] [address] [phone]       Follow up with RIGBY, MICHAEL, DO. (Monday 3/10 @ 130 PM)    Contact information:   [address] [place] [address] [phone]       Follow up with DETERDING,JAMES L, MD. Schedule an appointment as  soon as possible for a visit in 2 weeks.   Contact information:   [place] [address] [phone]         Discharge Instructions: Please refer to Patient Instructions section of EMR for full details.  Patient was counseled important signs and symptoms that should prompt return to medical care, changes in medications, dietary instructions, activity restrictions, and follow up appointments.  Significant instructions noted below:    Discharge Medications   Medication List    TAKE these medications       amLODipine 10 MG tablet  Commonly known as:  NORVASC  Take 1 tablet (10 mg total) by mouth at bedtime.     atorvastatin 20 MG tablet  Commonly known as:  LIPITOR  Take 1 tablet (20 mg total) by mouth daily at 6 PM.     furosemide 80 MG tablet  Commonly known as:  LASIX  Take 1 tablet (80 mg total) by mouth 2 (two) times daily.     hydrALAZINE 100 MG tablet  Commonly known as:  APRESOLINE  Take 1 tablet (100 mg total) by mouth 2 (two) times daily.     metoprolol tartrate 25 MG tablet  Commonly known as:  LOPRESSOR  Take 5 tablets (125 mg total) by mouth 2 (two) times daily.     sodium bicarbonate 650 MG tablet  Take 1 tablet (650 mg total) by mouth 2 (two) times daily.            Kennith Maes, DO of [facility] [place] 08/01/2012 11:40 AM

## 2012-08-01 NOTE — Anesthesia Procedure Notes (Signed)
Procedure Name: Intubation Date/Time: 08/01/2012 11:14 AM Performed by: Erik Obey Pre-anesthesia Checklist: Patient identified, Emergency Drugs available, Suction available, Patient being monitored and Timeout performed Patient Re-evaluated:Patient Re-evaluated prior to inductionOxygen Delivery Method: Circle system utilized Preoxygenation: Pre-oxygenation with 100% oxygen Intubation Type: IV induction Ventilation: Mask ventilation without difficulty Laryngoscope Size: Mac and 3 Grade View: Grade II Tube type: Oral Tube size: 7.5 mm Number of attempts: 1 Airway Equipment and Method: Stylet and LTA kit utilized Placement Confirmation: ETT inserted through vocal cords under direct vision,  positive ETCO2 and breath sounds checked- equal and bilateral Secured at: 22 cm Tube secured with: Tape Dental Injury: Teeth and Oropharynx as per pre-operative assessment

## 2012-08-01 NOTE — Progress Notes (Signed)
Clinical Social Work  CSW went to room to meet with patient. Patient out of room for procedure and RN is unsure when patient will return. CSW will follow up at later time.   [place], [place] [phone]

## 2012-08-01 NOTE — Anesthesia Postprocedure Evaluation (Signed)
  Anesthesia Post-op Note  Patient: Brittney Contreras  Procedure(s) Performed: Procedure(s) with comments: ARTERIOVENOUS (AV) FISTULA CREATION (Right) - Ultrasound guided; Attempted wrist AV Fistula Creation.  Patient Location: PACU  Anesthesia Type:General  Level of Consciousness: awake  Airway and Oxygen Therapy: Patient Spontanous Breathing  Post-op Pain: mild  Post-op Assessment: Post-op Vital signs reviewed  Post-op Vital Signs: stable  Complications: No apparent anesthesia complications

## 2012-08-01 NOTE — Progress Notes (Signed)
Subjective: Interval History: has complaints arm pain post procedure, sleepy.  Objective: Vital signs in last 24 hours: Temp:  [97.7 F (36.5 C)-98.7 F (37.1 C)] 97.7 F (36.5 C) (02/28 1508) Pulse Rate:  [62-79] 69 (02/28 1508) Resp:  [13-20] 20 (02/28 1508) BP: (117-167)/(69-94) 152/92 mmHg (02/28 1508) SpO2:  [93 %-100 %] 94 % (02/28 1508) Weight:  [127.733 kg (281 lb 9.6 oz)-129.7 kg (285 lb 15 oz)] 129.7 kg (285 lb 15 oz) (02/28 1508) Weight change: -2.067 kg (-4 lb 8.9 oz)  Intake/Output from previous day: 02/27 0701 - 02/28 0700 In: 940 [P.O.:940] Out: 2450 [Urine:2450] Intake/Output this shift: Total I/O In: 800 [I.V.:800] Out: 50 [Blood:50]  General appearance: moderately obese and slowed mentation Resp: diminished breath sounds bilaterally Cardio: S1, S2 normal and systolic murmur: holosystolic 2/6, blowing at apex GI: obese,pos bs,liver down 5 cm Extremities: AVF RUA B&T, R hand warm  Lab Results:  Recent Labs  07/31/12 0610 08/01/12 0500  WBC 9.8 10.9*  HGB 11.7* 12.2  HCT 32.2* 33.3*  PLT 251 277   BMET:  Recent Labs  07/31/12 0610 08/01/12 0500  NA 134* 136  K 3.7 3.7  CL 98 101  CO2 18* 19  GLUCOSE 92 100*  BUN 53* 61*  CREATININE 5.55* 6.04*  CALCIUM 8.9 8.6   No results found for this basename: PTH,  in the last 72 hours Iron Studies: No results found for this basename: IRON, TIBC, TRANSFERRIN, FERRITIN,  in the last 72 hours  Studies/Results: No results found.  I have reviewed the patient's current medications.  Assessment/Plan: 1 CKD 5 Cr rising as expected with BP control.  Mild acidemia ,cont bicarb 2 Anemia stable 3 HTN better control 4 Tobacco abuse 5 Obesity 6 Personality disorder P bp meds, d/c in 1-2 d.    LOS: 7 days   Jezabelle Chisolm L 08/01/2012,4:10 PM

## 2012-08-01 NOTE — Transfer of Care (Signed)
Immediate Anesthesia Transfer of Care Note  Patient: Brittney Contreras  Procedure(s) Performed: Procedure(s) with comments: ARTERIOVENOUS (AV) FISTULA CREATION (Right) - Ultrasound guided; Attempted wrist AV Fistula Creation.  Patient Location: PACU  Anesthesia Type:General  Level of Consciousness: awake, alert  and oriented  Airway & Oxygen Therapy: Patient Spontanous Breathing and Patient connected to nasal cannula oxygen  Post-op Assessment: Report given to PACU RN, Post -op Vital signs reviewed and stable and Patient moving all extremities X 4  Post vital signs: Reviewed and stable  Complications: No apparent anesthesia complications

## 2012-08-01 NOTE — Preoperative (Signed)
Beta Blockers   Reason not to administer Beta Blockers:Not Applicable 

## 2012-08-01 NOTE — Progress Notes (Signed)
Consult Update:  Pt is out of room, in OR for fistula placement.   Kyung Muto J. Evern Bio, MD Psychiatrist 08/01/2012 2:21 PM

## 2012-08-02 DIAGNOSIS — R7989 Other specified abnormal findings of blood chemistry: Secondary | ICD-10-CM

## 2012-08-02 LAB — RENAL FUNCTION PANEL
BUN: 60 mg/dL — ABNORMAL HIGH (ref 6–23)
CO2: 19 mEq/L (ref 19–32)
Calcium: 9.1 mg/dL (ref 8.4–10.5)
Creatinine, Ser: 5.75 mg/dL — ABNORMAL HIGH (ref 0.50–1.10)
GFR calc non Af Amer: 9 mL/min — ABNORMAL LOW (ref >90)
Glucose, Bld: 128 mg/dL — ABNORMAL HIGH (ref 70–99)

## 2012-08-02 LAB — CBC
HCT: 33.6 % — ABNORMAL LOW (ref 36.0–46.0)
Hemoglobin: 12.1 g/dL (ref 12.0–15.0)
MCH: 28.8 pg (ref 26.0–34.0)
MCHC: 36 g/dL (ref 30.0–36.0)
MCV: 80 fL (ref 78.0–100.0)
RBC: 4.2 MIL/uL (ref 3.87–5.11)

## 2012-08-02 MED ORDER — ATORVASTATIN CALCIUM 20 MG PO TABS: 20.0000 mg | ORAL_TABLET | Freq: Every day | ORAL | Status: DC

## 2012-08-02 MED ORDER — SODIUM BICARBONATE 650 MG PO TABS: 650.0000 mg | ORAL_TABLET | Freq: Two times a day (BID) | ORAL | Status: DC

## 2012-08-02 MED ORDER — AMLODIPINE BESYLATE 10 MG PO TABS: 10.0000 mg | ORAL_TABLET | Freq: Every day | ORAL | Status: DC

## 2012-08-02 MED ORDER — METOPROLOL TARTRATE 25 MG PO TABS: 125.0000 mg | ORAL_TABLET | Freq: Two times a day (BID) | ORAL | Status: DC

## 2012-08-02 MED ORDER — FUROSEMIDE 80 MG PO TABS: 80.0000 mg | ORAL_TABLET | Freq: Two times a day (BID) | ORAL | Status: DC

## 2012-08-02 MED ORDER — HYDRALAZINE HCL 100 MG PO TABS: 100.0000 mg | ORAL_TABLET | Freq: Two times a day (BID) | ORAL | Status: DC

## 2012-08-02 NOTE — Progress Notes (Signed)
Subjective: Interval History: has complaints wants to go home.  Objective: Vital signs in last 24 hours: Temp:  [97.4 F (36.3 C)-98.3 F (36.8 C)] 98.2 F (36.8 C) (03/01 0901) Pulse Rate:  [65-79] 68 (03/01 0901) Resp:  [13-20] 18 (03/01 0901) BP: (135-167)/(73-99) 149/99 mmHg (03/01 0901) SpO2:  [94 %-100 %] 99 % (03/01 0901) Weight:  [128.4 kg (283 lb 1.1 oz)-129.7 kg (285 lb 15 oz)] 128.4 kg (283 lb 1.1 oz) (03/01 0524) Weight change: 1.967 kg (4 lb 5.4 oz)  Intake/Output from previous day: 02/28 0701 - 03/01 0700 In: 1365 [P.O.:562; I.V.:803] Out: 1250 [Urine:1200; Blood:50] Intake/Output this shift: Total I/O In: -  Out: 650 [Urine:650]  General appearance: alert, cooperative and morbidly obese Resp: diminished breath sounds bilaterally Cardio: S1, S2 normal and systolic murmur: holosystolic 2/6, blowing at apex GI: pos bs,liver down, obese.  Extremities: avf RUA B&T  Lab Results:  Recent Labs  07/31/12 0610 08/01/12 0500  WBC 9.8 10.9*  HGB 11.7* 12.2  HCT 32.2* 33.3*  PLT 251 277   BMET:  Recent Labs  07/31/12 0610 08/01/12 0500  NA 134* 136  K 3.7 3.7  CL 98 101  CO2 18* 19  GLUCOSE 92 100*  BUN 53* 61*  CREATININE 5.55* 6.04*  CALCIUM 8.9 8.6   No results found for this basename: PTH,  in the last 72 hours Iron Studies: No results found for this basename: IRON, TIBC, TRANSFERRIN, FERRITIN,  in the last 72 hours  Studies/Results: No results found.  I have reviewed the patient's current medications.  Assessment/Plan: 1 CKD 5 vol ok.  bp better. Chem P 2 Anemia epo,fe 3 Obesity ? Plan 4 HTPH vit D P HD,epo, f/u in office in 2 wk    LOS: 8 days   Linsi Humann L 08/02/2012,10:26 AM

## 2012-08-02 NOTE — Progress Notes (Addendum)
   CARE MANAGEMENT NOTE 08/02/2012  Patient:  DANAYSIA, SCAVONE   Account Number:  [id number]  Date Initiated:  07/28/2012  Documentation initiated by:  Elissa Hefty  Subjective/Objective Assessment:   adm w mi     Action/Plan:   lives w fam, no ins listed   Anticipated DC Date:  08/02/2012   Anticipated DC Plan:  [place]  CM consult  [place] Program      Choice offered to / List presented to:             Status of service:  Completed, signed off Medicare Important Message given?   (If response is "NO", the following Medicare IM given date fields will be blank) Date Medicare IM given:   Date Additional Medicare IM given:    Discharge Disposition:  HOME/SELF CARE  Per UR Regulation:  Reviewed for med. necessity/level of care/duration of stay  If discussed at [place] of Stay Meetings, dates discussed:   07/31/2012    Comments:  08/02/2012 1715 NCM spoke to pt and she will pick up letter on 08/03/2012. State she has not dropped medications to pharmacy. NCM explained the importance of picking up medications and no skipping any doses. She wanted to see if she could pick up meds on Monday. Educated pt that complications could arise from not taking medications as prescribed. States she will have family pick up letter and call NCM when they arrive on 3/2. Jonnie Finner RN CCM Case Mgmt phone [phone]  08/02/2012 1545 NCM went to give letter for [facility] and pt dc home. Attempted call to home number and number disconnected. Contacted mother cell number and she provided alt number # [phone]. Called number and left message with person answering phone for pt to give NCM a call back to see if she still is wants [place] letter. If pt does not need letter, will cancel [place] and she can use at another time. Jonnie Finner RN CCM Case Mgmt phone [phone]  08/02/2012 1440 NCM spoke to pt and discussed medications. Pt states she is currently not working and no  Copy. States her mother, Stanton Kidney usually helps with financially and will help with meds. NCM explained [place] program and that it can be used onces per year. Explained she will have a $3 copay for meds. States she would like assistance. Explained NCM will provide a letter for her to take with Rx to pharmacy. Jonnie Finner RN CCM Case Mgmt phone [phone]  2/25 [phone] debbie dowell rn,bsn gave pt resourse list for [place] [facility] to help find pcp. no ins. gave pt 2 prescription assist cards that may help w brand name meds. at disch the more 4.00 meds pt could be placed on would be better. will cont to follow.  2/24 0953 debbie dowell rn,bsn

## 2012-08-02 NOTE — Progress Notes (Addendum)
  VASCULAR AND VEIN SURGERY PROGRESS NOTE  POST-OP HEMODIALYSIS ACCESS  Date of Surgery: 07/25/2012 - 08/01/2012 Surgeon: Juliann Mule): Contreras Misty, MD 1 Day Post-Op Right Procedure(s): ARTERIOVENOUS (AV) FISTULA CREATION   HPI: Brittney Contreras is a 37 y.o. female who is 1 Day Post-Op creation/revision of right upper extremity Hemodialysis access. The patient denies symptoms of numbness, tingling, weakness; complains of pain in the operative limb.   Significant Diagnostic Studies: CBC Lab Results  Component Value Date   WBC 10.9* 08/01/2012   HGB 12.2 08/01/2012   HCT 33.3* 08/01/2012   MCV 79.1 08/01/2012   PLT 277 08/01/2012    BMET    Component Value Date/Time   NA 136 08/01/2012 0500   K 3.7 08/01/2012 0500   CL 101 08/01/2012 0500   CO2 19 08/01/2012 0500   GLUCOSE 100* 08/01/2012 0500   BUN 61* 08/01/2012 0500   CREATININE 6.04* 08/01/2012 0500   CALCIUM 8.6 08/01/2012 0500   GFRNONAA 8* 08/01/2012 0500   GFRAA 9* 08/01/2012 0500    COAG No results found for this basename: INR, PROTIME   No results found for this basename: PTT    Vital Signs  BP Readings from Last 3 Encounters:  08/02/12 135/73  08/02/12 135/73   Temp Readings from Last 3 Encounters:  08/02/12 98 F (36.7 C) Oral  08/02/12 98 F (36.7 C) Oral   SpO2 Readings from Last 3 Encounters:  08/02/12 99%  08/02/12 99%   Pulse Readings from Last 3 Encounters:  08/02/12 65  08/02/12 65     Physical Examination  right upper Incision is healing well, skin color is normal , hand grip is 5/5, sensation in digits is intact;  There is a good thrill and good bruit in the B-C AVF.  Assessment/Plan Brittney Contreras is a 37 y.o. year old female who presents s/p creation/revision of right upper extremity Hemodialysis access. Follow-up in 6 weeks - our office will arrange  The patient's access will be ready for use in 12 weeks.  Call if further assistance needed  Timnath J 08/02/2012 8:04 AM   Agree  with above assessment Both distal and antecubital wounds healing satisfactorily right upper extremity No evidence of steal with 2+ radial pulse and warm perfused right hand Good thrill over AV fistula and upper arm  I will see patient in office in 6 weeks

## 2012-08-02 NOTE — Discharge Summary (Signed)
Family Medicine Teaching Service Attending Note  I interviewed and examined patient Brittney Contreras and reviewed their tests and x-rays.  I discussed with Dr. Awanda Mink and reviewed their note for today.  I agree with their assessment and plan.     Additionally  She is somewhat interactive Voices that something bad could happen is she does not control her blood pressure Promises to follow up with [place] at outpatient

## 2012-08-03 NOTE — Progress Notes (Signed)
   CARE MANAGEMENT NOTE 08/03/2012  Patient:  Brittney Contreras, Brittney Contreras   Account Number:  [id number]  Date Initiated:  07/28/2012  Documentation initiated by:  Elissa Hefty  Subjective/Objective Assessment:   adm w mi     Action/Plan:   lives w fam, no ins listed   Anticipated DC Date:  08/02/2012   Anticipated DC Plan:  [place]  CM consult  [facility] [facility]      Choice offered to / List presented to:             Status of service:  Completed, signed off Medicare Important Message given?   (If response is "NO", the following Medicare IM given date fields will be blank) Date Medicare IM given:   Date Additional Medicare IM given:    Discharge Disposition:  HOME/SELF CARE  Per UR Regulation:  Reviewed for med. necessity/level of care/duration of stay  If discussed at [place] of Stay Meetings, dates discussed:   07/31/2012    Comments:  08/03/2012 1745 Pt did not come to pick up letter. NCM contacted to follow on pharmacy Rx and pt states she will notify NCM on 3/3 what pharmacy she plans to use.   Will fax letter to pt's desired pharmacy if they use [facility]. Jonnie Finner RN CCM Case Mgmt phone [phone]  08/03/2012 1100 NCM contacted pt. States she will pick up letter today. She has not picked up meds from pharmacy. Jonnie Finner RN CCM Case Mgmt phone [phone]  08/02/2012 1715 NCM spoke to pt and she will pick up letter on 08/03/2012. State she has not dropped medications to pharmacy. NCM explained the importance of picking up medications and no skipping any doses. She wanted to see if she could pick up meds on Monday. Educated pt that complications could arise from not taking medications as prescribed. States she will have family pick up letter and call NCM when they arrive on 3/2. Jonnie Finner RN CCM Case Mgmt phone [phone]  08/02/2012 1545 NCM went to give letter for [facility] and pt dc home. Attempted call to home number and number disconnected.  Contacted mother cell number and she provided alt number # [phone]. Called number and left message with person answering phone for pt to give NCM a call back to see if she still is wants [place] letter. If pt does not need letter, will cancel [place] and she can use at another time. Jonnie Finner RN CCM Case Mgmt phone [phone]   2/25 [phone] debbie dowell rn,bsn gave pt resourse list for [facility] to help find pcp. no ins. gave pt 2 prescription assist cards that may help w brand name meds. at disch the more 4.00 meds pt could be placed on would be better. will cont to follow.  2/24 0953 debbie dowell rn,bsn

## 2012-08-04 ENCOUNTER — Encounter (HOSPITAL_COMMUNITY): Payer: Self-pay | Admitting: Vascular Surgery

## 2012-08-12 ENCOUNTER — Inpatient Hospital Stay: Payer: Self-pay | Admitting: Sports Medicine

## 2012-08-26 ENCOUNTER — Encounter: Payer: Self-pay | Admitting: Vascular Surgery

## 2012-08-26 ENCOUNTER — Ambulatory Visit (INDEPENDENT_AMBULATORY_CARE_PROVIDER_SITE_OTHER): Payer: Self-pay | Admitting: Vascular Surgery

## 2012-08-26 VITALS — BP 138/80 | HR 79 | Resp 18 | Ht 63.0 in | Wt 283.0 lb

## 2012-08-26 DIAGNOSIS — N186 End stage renal disease: Secondary | ICD-10-CM | POA: Insufficient documentation

## 2012-08-26 NOTE — Progress Notes (Signed)
Subjective:     Patient ID: Brittney Contreras, female   DOB: 03-Mar-1976, 37 y.o.   MRN: [id number]  HPIthis 37 year old female with end-stage renal disease is not yet on hemodialysis. I created a fistula in her right upper extremity 08/01/2012. Initially I attempted a radial to cephalic AV fistula the vein was not satisfactory and it thrombosed in the OR. I then proceeded to perform a right brachial cephalic upper arm AV fistula. She has denied any pain or numbness in the right hand. She saw Dr. Clair Gulling Deterding  today in the nephrology office who felt that the fistula was occluded and sent her over for further evaluation.   Review of Systems     Objective:   Physical Exam BP 138/80  Pulse 79  Resp 18  Ht 5\' 3"  (1.6 m)  Wt 283 lb (128.368 kg)  BMI 50.14 kg/m2  Gen.-obese female in no apparent distress alert and oriented x3 Right upper extremity with satisfactory healing of distal forearm and antecubital wounds. Right hand is adequately perfused. Upper arm fistula is patent with good pulse and palpable thrill. Arm is large enough that access may be an issue.   I am is a fistula with the SonoSite ultrasound device at the bedside and it is widely patent with no competing branches noted      Assessment:     Widely patent right brachial-cephalic AV fistula-patient not on hemodialysis yet     Plan:     Return in 6 weeks for duplex scan of fistula in our office

## 2012-08-26 NOTE — Addendum Note (Signed)
Addended by: Reola Calkins on: 08/26/2012 04:29 PM   Modules accepted: Orders

## 2012-09-02 ENCOUNTER — Ambulatory Visit: Payer: Self-pay | Admitting: Vascular Surgery

## 2012-09-03 ENCOUNTER — Encounter: Payer: Self-pay | Admitting: Nephrology

## 2012-10-23 ENCOUNTER — Encounter: Payer: Self-pay | Admitting: Vascular Surgery

## 2012-10-24 ENCOUNTER — Encounter: Payer: Self-pay | Admitting: Vascular Surgery

## 2012-10-24 ENCOUNTER — Ambulatory Visit (INDEPENDENT_AMBULATORY_CARE_PROVIDER_SITE_OTHER): Payer: Medicaid Other | Admitting: Vascular Surgery

## 2012-10-24 ENCOUNTER — Encounter (INDEPENDENT_AMBULATORY_CARE_PROVIDER_SITE_OTHER): Payer: Medicaid Other | Admitting: *Deleted

## 2012-10-24 ENCOUNTER — Encounter: Payer: Self-pay | Admitting: *Deleted

## 2012-10-24 ENCOUNTER — Other Ambulatory Visit: Payer: Self-pay | Admitting: *Deleted

## 2012-10-24 VITALS — BP 150/86 | HR 67 | Ht 63.0 in | Wt 283.0 lb

## 2012-10-24 DIAGNOSIS — Z4931 Encounter for adequacy testing for hemodialysis: Secondary | ICD-10-CM

## 2012-10-24 DIAGNOSIS — N186 End stage renal disease: Secondary | ICD-10-CM

## 2012-10-24 DIAGNOSIS — T82598A Other mechanical complication of other cardiac and vascular devices and implants, initial encounter: Secondary | ICD-10-CM

## 2012-10-24 NOTE — Progress Notes (Signed)
Subjective:     Patient ID: Brittney Contreras, female   DOB: 23-May-1976, 37 y.o.   MRN: [id number]  HPI this 37 year old female with chronic renal insufficiency had AV fistula in the right upper arm created on the every 28 of this year. She returns for followup. She denies any pain or numbness in the right hand. She had an attempted radial cephalic fistula which was unsuccessful.  Past Medical History  Diagnosis Date  . Hypertension   . Hypertensive hypertrophic cardiomyopathy 07/28/2012  . Diastolic heart failure: Grade 3  07/25/2012  . Obesity, morbid 07/25/2012  . Malignant hypertension with congestive heart failure 07/25/2012  . Tobacco abuse 07/27/2012  . Chronic kidney disease     History  Substance Use Topics  . Smoking status: Former Smoker -- 20 years    Types: Cigarettes    Quit date: 08/02/2012  . Smokeless tobacco: Never Used  . Alcohol Use: Yes     Comment: occ    Family History  Problem Relation Age of Onset  . Kidney disease      Aunt, deceased, had been on dialysis  . Hypertension Mother   . Heart disease Mother     No Known Allergies  Current outpatient prescriptions:amLODipine (NORVASC) 10 MG tablet, Take 1 tablet (10 mg total) by mouth at bedtime., Disp: 30 tablet, Rfl: 2;  atorvastatin (LIPITOR) 20 MG tablet, Take 1 tablet (20 mg total) by mouth daily at 6 PM., Disp: 30 tablet, Rfl: 2;  furosemide (LASIX) 80 MG tablet, Take 1 tablet (80 mg total) by mouth 2 (two) times daily., Disp: 60 tablet, Rfl: 2 hydrALAZINE (APRESOLINE) 100 MG tablet, Take 1 tablet (100 mg total) by mouth 2 (two) times daily., Disp: 60 tablet, Rfl: 2;  metoprolol (LOPRESSOR) 25 MG tablet, Take 5 tablets (125 mg total) by mouth 2 (two) times daily., Disp: 150 tablet, Rfl: 2;  sodium bicarbonate 650 MG tablet, Take 1 tablet (650 mg total) by mouth 2 (two) times daily., Disp: 60 tablet, Rfl: 2  BP 150/86  Pulse 67  Ht 5\' 3"  (1.6 m)  Wt 283 lb (128.368 kg)  BMI 50.14 kg/m2  SpO2 100%  Body mass  index is 50.14 kg/(m^2).           Review of Systems denies chest pain but does complain of dyspnea on exertion. No claudication or hemoptysis.    Objective:   Physical Exam blood pressure 150/86 heart rate 67 respirations 18 Gen.-alert and oriented x3 in no apparent distress-obese HEENT normal for age Lungs no rhonchi or wheezing Cardiovascular regular rhythm no murmurs carotid pulses 3+ palpable no bruits audible Abdomen soft nontender no palpable masses-obese Musculoskeletal free of  major deformities Skin clear -no rashes Neurologic normal Lower extremities 3+ femoral and dorsalis pedis pulses palpable bilaterally with no edema Right upper extremity with well-healed incision in the distal forearm and antecubital area. There is a good pulse and palpable thrill in the upper arm fistula.  Today I ordered a duplex scan of the right upper arm fistula which are reviewed and interpreted. There is some narrowing of the vein near the arterial anastomosis. There are also 3 competing branches in the upper arm and the vein is somewhat tortuous.        Assessment:     Right brachial cephalic AV fistula with 3 competing branches and somewhat deep because of patient's anatomy-possible narrowing of vein near arterial anastomosis    Plan:     Plan superficial ulceration of right brachiocephalic  AV fistula with ligation of competing branches on Wednesday, June 4 under general anesthesia Will assess flow at that time regarding Nehring of vein near arterial anastomosis

## 2012-10-28 ENCOUNTER — Ambulatory Visit: Payer: Self-pay | Admitting: Vascular Surgery

## 2012-10-30 ENCOUNTER — Encounter (HOSPITAL_COMMUNITY): Payer: Self-pay | Admitting: Pharmacy Technician

## 2012-11-04 ENCOUNTER — Encounter (HOSPITAL_COMMUNITY): Payer: Self-pay | Admitting: *Deleted

## 2012-11-04 MED ORDER — SODIUM CHLORIDE 0.9 % IV SOLN
INTRAVENOUS | Status: DC
  Administered 2012-11-05: 10:00:00 via INTRAVENOUS

## 2012-11-04 MED ORDER — DEXTROSE 5 % IV SOLN
1.5000 g | INTRAVENOUS | Status: AC
  Administered 2012-11-05: 1.5 g via INTRAVENOUS
  Filled 2012-11-04: qty 1.5

## 2012-11-05 ENCOUNTER — Ambulatory Visit (HOSPITAL_COMMUNITY): Payer: Medicaid Other | Admitting: Anesthesiology

## 2012-11-05 ENCOUNTER — Encounter (HOSPITAL_COMMUNITY): Payer: Self-pay | Admitting: Anesthesiology

## 2012-11-05 ENCOUNTER — Encounter (HOSPITAL_COMMUNITY): Payer: Self-pay | Admitting: *Deleted

## 2012-11-05 ENCOUNTER — Other Ambulatory Visit: Payer: Self-pay | Admitting: *Deleted

## 2012-11-05 ENCOUNTER — Ambulatory Visit (HOSPITAL_COMMUNITY)
Admission: RE | Admit: 2012-11-05 | Discharge: 2012-11-05 | Disposition: A | Discharge status: Home / Self Care | Payer: Medicaid Other | Source: Ambulatory Visit | Attending: Vascular Surgery | Admitting: Vascular Surgery

## 2012-11-05 ENCOUNTER — Encounter (HOSPITAL_COMMUNITY)
Admission: RE | Disposition: A | Discharge status: Home / Self Care | Payer: Self-pay | Source: Ambulatory Visit | Attending: Vascular Surgery

## 2012-11-05 DIAGNOSIS — I509 Heart failure, unspecified: Secondary | ICD-10-CM | POA: Insufficient documentation

## 2012-11-05 DIAGNOSIS — Y832 Surgical operation with anastomosis, bypass or graft as the cause of abnormal reaction of the patient, or of later complication, without mention of misadventure at the time of the procedure: Secondary | ICD-10-CM | POA: Insufficient documentation

## 2012-11-05 DIAGNOSIS — I422 Other hypertrophic cardiomyopathy: Secondary | ICD-10-CM | POA: Insufficient documentation

## 2012-11-05 DIAGNOSIS — Z6841 Body Mass Index (BMI) 40.0 and over, adult: Secondary | ICD-10-CM | POA: Insufficient documentation

## 2012-11-05 DIAGNOSIS — Z87891 Personal history of nicotine dependence: Secondary | ICD-10-CM | POA: Insufficient documentation

## 2012-11-05 DIAGNOSIS — N186 End stage renal disease: Secondary | ICD-10-CM | POA: Insufficient documentation

## 2012-11-05 DIAGNOSIS — Z4931 Encounter for adequacy testing for hemodialysis: Secondary | ICD-10-CM

## 2012-11-05 DIAGNOSIS — I120 Hypertensive chronic kidney disease with stage 5 chronic kidney disease or end stage renal disease: Secondary | ICD-10-CM | POA: Insufficient documentation

## 2012-11-05 DIAGNOSIS — I5032 Chronic diastolic (congestive) heart failure: Secondary | ICD-10-CM | POA: Insufficient documentation

## 2012-11-05 DIAGNOSIS — Z79899 Other long term (current) drug therapy: Secondary | ICD-10-CM | POA: Insufficient documentation

## 2012-11-05 DIAGNOSIS — T82598A Other mechanical complication of other cardiac and vascular devices and implants, initial encounter: Secondary | ICD-10-CM | POA: Insufficient documentation

## 2012-11-05 HISTORY — PX: LIGATION OF COMPETING BRANCHES OF ARTERIOVENOUS FISTULA: SHX5949

## 2012-11-05 LAB — POCT I-STAT 4, (NA,K, GLUC, HGB,HCT)
Glucose, Bld: 103 mg/dL — ABNORMAL HIGH (ref 70–99)
Potassium: 3.6 mEq/L (ref 3.5–5.1)
Sodium: 142 mEq/L (ref 135–145)

## 2012-11-05 LAB — SURGICAL PCR SCREEN: MRSA, PCR: NEGATIVE

## 2012-11-05 SURGERY — FISTULA SUPERFICIALIZATION
Anesthesia: General | Site: Arm Upper | Laterality: Right | Wound class: Clean

## 2012-11-05 MED ORDER — MUPIROCIN 2 % EX OINT: TOPICAL_OINTMENT | Freq: Two times a day (BID) | CUTANEOUS | Status: DC

## 2012-11-05 MED ORDER — ONDANSETRON HCL 4 MG/2ML IJ SOLN: 4.0000 mg | Freq: Four times a day (QID) | INTRAMUSCULAR | Status: DC | PRN

## 2012-11-05 MED ORDER — LIDOCAINE-EPINEPHRINE (PF) 1 %-1:200000 IJ SOLN
INTRAMUSCULAR | Status: AC
  Filled 2012-11-05: qty 10

## 2012-11-05 MED ORDER — FENTANYL CITRATE 0.05 MG/ML IJ SOLN
INTRAMUSCULAR | Status: DC | PRN
  Administered 2012-11-05 (×3): 50 ug via INTRAVENOUS

## 2012-11-05 MED ORDER — OXYCODONE HCL 5 MG PO TABS: 5.0000 mg | ORAL_TABLET | Freq: Once | ORAL | Status: DC | PRN

## 2012-11-05 MED ORDER — OXYCODONE HCL 5 MG/5ML PO SOLN: 5.0000 mg | Freq: Once | ORAL | Status: DC | PRN

## 2012-11-05 MED ORDER — PROPOFOL 10 MG/ML IV BOLUS
INTRAVENOUS | Status: DC | PRN
  Administered 2012-11-05: 200 mg via INTRAVENOUS

## 2012-11-05 MED ORDER — FENTANYL CITRATE 0.05 MG/ML IJ SOLN: 25.0000 ug | INTRAMUSCULAR | Status: DC | PRN

## 2012-11-05 MED ORDER — ONDANSETRON HCL 4 MG/2ML IJ SOLN
INTRAMUSCULAR | Status: DC | PRN
  Administered 2012-11-05: 4 mg via INTRAVENOUS

## 2012-11-05 MED ORDER — MUPIROCIN 2 % EX OINT
TOPICAL_OINTMENT | CUTANEOUS | Status: AC
  Administered 2012-11-05: 1 via NASAL
  Filled 2012-11-05: qty 22

## 2012-11-05 MED ORDER — LIDOCAINE-EPINEPHRINE (PF) 1 %-1:200000 IJ SOLN: INTRAMUSCULAR | Status: DC | PRN

## 2012-11-05 MED ORDER — LIDOCAINE HCL (CARDIAC) 20 MG/ML IV SOLN
INTRAVENOUS | Status: DC | PRN
  Administered 2012-11-05: 100 mg via INTRAVENOUS

## 2012-11-05 MED ORDER — MIDAZOLAM HCL 5 MG/5ML IJ SOLN
INTRAMUSCULAR | Status: DC | PRN
  Administered 2012-11-05: 2 mg via INTRAVENOUS

## 2012-11-05 MED ORDER — OXYCODONE HCL 5 MG PO TABS: 5.0000 mg | ORAL_TABLET | Freq: Four times a day (QID) | ORAL | Status: DC | PRN

## 2012-11-05 MED ORDER — 0.9 % SODIUM CHLORIDE (POUR BTL) OPTIME
TOPICAL | Status: DC | PRN
  Administered 2012-11-05: 1000 mL

## 2012-11-05 SURGICAL SUPPLY — 34 items
ADH SKN CLS APL DERMABOND .7 (GAUZE/BANDAGES/DRESSINGS) ×1
CANISTER SUCTION 2500CC (MISCELLANEOUS) ×2 IMPLANT
CLIP TI MEDIUM 6 (CLIP) ×2 IMPLANT
CLIP TI WIDE RED SMALL 6 (CLIP) ×2 IMPLANT
CLOTH BEACON ORANGE TIMEOUT ST (SAFETY) ×2 IMPLANT
COVER PROBE W GEL 5X96 (DRAPES) IMPLANT
COVER SURGICAL LIGHT HANDLE (MISCELLANEOUS) ×2 IMPLANT
DERMABOND ADVANCED (GAUZE/BANDAGES/DRESSINGS) ×1
DERMABOND ADVANCED .7 DNX12 (GAUZE/BANDAGES/DRESSINGS) ×1 IMPLANT
ELECT REM PT RETURN 9FT ADLT (ELECTROSURGICAL) ×2
ELECTRODE REM PT RTRN 9FT ADLT (ELECTROSURGICAL) ×1 IMPLANT
GEL ULTRASOUND 20GR AQUASONIC (MISCELLANEOUS) ×1 IMPLANT
GLOVE BIOGEL M 6.5 STRL (GLOVE) ×2 IMPLANT
GLOVE BIOGEL PI IND STRL 6.5 (GLOVE) IMPLANT
GLOVE BIOGEL PI INDICATOR 6.5 (GLOVE) ×2
GLOVE SS BIOGEL STRL SZ 7 (GLOVE) ×1 IMPLANT
GLOVE SUPERSENSE BIOGEL SZ 7 (GLOVE) ×1
GLOVE SURG SS PI 7.5 STRL IVOR (GLOVE) ×1 IMPLANT
GOWN STRL NON-REIN LRG LVL3 (GOWN DISPOSABLE) ×4 IMPLANT
KIT BASIN OR (CUSTOM PROCEDURE TRAY) ×2 IMPLANT
KIT ROOM TURNOVER OR (KITS) ×2 IMPLANT
NS IRRIG 1000ML POUR BTL (IV SOLUTION) ×2 IMPLANT
PACK CV ACCESS (CUSTOM PROCEDURE TRAY) ×2 IMPLANT
PAD ARMBOARD 7.5X6 YLW CONV (MISCELLANEOUS) ×4 IMPLANT
SPONGE GAUZE 4X4 12PLY (GAUZE/BANDAGES/DRESSINGS) ×2 IMPLANT
SUT PROLENE 6 0 BV (SUTURE) ×2 IMPLANT
SUT VIC AB 3-0 SH 18 (SUTURE) ×1 IMPLANT
SUT VIC AB 3-0 SH 27 (SUTURE) ×4
SUT VIC AB 3-0 SH 27X BRD (SUTURE) ×1 IMPLANT
SUT VIC AB 4-0 PS2 27 (SUTURE) ×1 IMPLANT
TOWEL OR 17X24 6PK STRL BLUE (TOWEL DISPOSABLE) ×2 IMPLANT
TOWEL OR 17X26 10 PK STRL BLUE (TOWEL DISPOSABLE) ×3 IMPLANT
UNDERPAD 30X30 INCONTINENT (UNDERPADS AND DIAPERS) ×2 IMPLANT
WATER STERILE IRR 1000ML POUR (IV SOLUTION) ×2 IMPLANT

## 2012-11-05 NOTE — Transfer of Care (Signed)
Immediate Anesthesia Transfer of Care Note  Patient: Brittney Contreras  Procedure(s) Performed: Procedure(s): FISTULA SUPERFICIALIZATION (Right) LIGATION OF COMPETING BRANCHES OF ARTERIOVENOUS FISTULA (Right)  Patient Location: PACU  Anesthesia Type:General  Level of Consciousness: awake, alert  and oriented  Airway & Oxygen Therapy: Patient Spontanous Breathing and Patient connected to nasal cannula oxygen  Post-op Assessment: Report given to PACU RN and Post -op Vital signs reviewed and stable  Post vital signs: Reviewed and stable  Complications: No apparent anesthesia complications

## 2012-11-05 NOTE — Anesthesia Postprocedure Evaluation (Signed)
Anesthesia Post Note  Patient: Brittney Contreras  Procedure(s) Performed: Procedure(s) (LRB): FISTULA SUPERFICIALIZATION (Right) LIGATION OF COMPETING BRANCHES OF ARTERIOVENOUS FISTULA (Right)  Anesthesia type: General  Patient location: PACU  Post pain: Pain level controlled and Adequate analgesia  Post assessment: Post-op Vital signs reviewed, Patient's Cardiovascular Status Stable, Respiratory Function Stable, Patent Airway and Pain level controlled  Last Vitals:  Filed Vitals:   11/05/12 1247  BP: 143/84  Pulse: 60  Temp:   Resp: 15    Post vital signs: Reviewed and stable  Level of consciousness: awake, alert  and oriented  Complications: No apparent anesthesia complications

## 2012-11-05 NOTE — H&P (View-Only) (Signed)
Subjective:     Patient ID: Brittney Contreras, female   DOB: 1975/12/22, 37 y.o.   MRN: [id number]  HPI this 37 year old female with chronic renal insufficiency had AV fistula in the right upper arm created on the every 28 of this year. She returns for followup. She denies any pain or numbness in the right hand. She had an attempted radial cephalic fistula which was unsuccessful.  Past Medical History  Diagnosis Date  . Hypertension   . Hypertensive hypertrophic cardiomyopathy 07/28/2012  . Diastolic heart failure: Grade 3  07/25/2012  . Obesity, morbid 07/25/2012  . Malignant hypertension with congestive heart failure 07/25/2012  . Tobacco abuse 07/27/2012  . Chronic kidney disease     History  Substance Use Topics  . Smoking status: Former Smoker -- 20 years    Types: Cigarettes    Quit date: 08/02/2012  . Smokeless tobacco: Never Used  . Alcohol Use: Yes     Comment: occ    Family History  Problem Relation Age of Onset  . Kidney disease      Aunt, deceased, had been on dialysis  . Hypertension Mother   . Heart disease Mother     No Known Allergies  Current outpatient prescriptions:amLODipine (NORVASC) 10 MG tablet, Take 1 tablet (10 mg total) by mouth at bedtime., Disp: 30 tablet, Rfl: 2;  atorvastatin (LIPITOR) 20 MG tablet, Take 1 tablet (20 mg total) by mouth daily at 6 PM., Disp: 30 tablet, Rfl: 2;  furosemide (LASIX) 80 MG tablet, Take 1 tablet (80 mg total) by mouth 2 (two) times daily., Disp: 60 tablet, Rfl: 2 hydrALAZINE (APRESOLINE) 100 MG tablet, Take 1 tablet (100 mg total) by mouth 2 (two) times daily., Disp: 60 tablet, Rfl: 2;  metoprolol (LOPRESSOR) 25 MG tablet, Take 5 tablets (125 mg total) by mouth 2 (two) times daily., Disp: 150 tablet, Rfl: 2;  sodium bicarbonate 650 MG tablet, Take 1 tablet (650 mg total) by mouth 2 (two) times daily., Disp: 60 tablet, Rfl: 2  BP 150/86  Pulse 67  Ht 5\' 3"  (1.6 m)  Wt 283 lb (128.368 kg)  BMI 50.14 kg/m2  SpO2 100%  Body mass  index is 50.14 kg/(m^2).           Review of Systems denies chest pain but does complain of dyspnea on exertion. No claudication or hemoptysis.    Objective:   Physical Exam blood pressure 150/86 heart rate 67 respirations 18 Gen.-alert and oriented x3 in no apparent distress-obese HEENT normal for age Lungs no rhonchi or wheezing Cardiovascular regular rhythm no murmurs carotid pulses 3+ palpable no bruits audible Abdomen soft nontender no palpable masses-obese Musculoskeletal free of  major deformities Skin clear -no rashes Neurologic normal Lower extremities 3+ femoral and dorsalis pedis pulses palpable bilaterally with no edema Right upper extremity with well-healed incision in the distal forearm and antecubital area. There is a good pulse and palpable thrill in the upper arm fistula.  Today I ordered a duplex scan of the right upper arm fistula which are reviewed and interpreted. There is some narrowing of the vein near the arterial anastomosis. There are also 3 competing branches in the upper arm and the vein is somewhat tortuous.        Assessment:     Right brachial cephalic AV fistula with 3 competing branches and somewhat deep because of patient's anatomy-possible narrowing of vein near arterial anastomosis    Plan:     Plan superficial ulceration of right brachiocephalic  AV fistula with ligation of competing branches on Wednesday, June 4 under general anesthesia Will assess flow at that time regarding Nehring of vein near arterial anastomosis

## 2012-11-05 NOTE — Progress Notes (Signed)
Patient with limited sites for venipuncture. Called by PBT for assist. Noted IV site in hand with limited other options for IV/ PBT. IV started in [place] and blood specimen obtained from same.

## 2012-11-05 NOTE — Anesthesia Preprocedure Evaluation (Addendum)
Anesthesia Evaluation  Patient identified by MRN, date of birth, ID band Patient awake    Reviewed: Allergy & Precautions, H&P , NPO status , Patient's Chart, lab work & pertinent test results  History of Anesthesia Complications Negative for: history of anesthetic complications  Airway Mallampati: II TM Distance: >3 FB Neck ROM: full    Dental  (+) Dental Advisory Given, Chipped and Teeth Intact,    Pulmonary former smoker,          Cardiovascular hypertension, Pt. on medications and Pt. on home beta blockers +CHF     Neuro/Psych negative neurological ROS  negative psych ROS   GI/Hepatic negative GI ROS, Neg liver ROS,   Endo/Other  Morbid obesity  Renal/GU ESRF and DialysisRenal disease     Musculoskeletal negative musculoskeletal ROS (+)   Abdominal   Peds negative pediatric ROS (+)  Hematology negative hematology ROS (+)   Anesthesia Other Findings   Reproductive/Obstetrics negative OB ROS                         Anesthesia Physical Anesthesia Plan  ASA: III  Anesthesia Plan: General   Post-op Pain Management:    Induction: Intravenous  Airway Management Planned: LMA  Additional Equipment:   Intra-op Plan:   Post-operative Plan:   Informed Consent: I have reviewed the patients History and Physical, chart, labs and discussed the procedure including the risks, benefits and alternatives for the proposed anesthesia with the patient or authorized representative who has indicated his/her understanding and acceptance.     Plan Discussed with: CRNA, Anesthesiologist and Surgeon  Anesthesia Plan Comments:         Anesthesia Quick Evaluation

## 2012-11-05 NOTE — Progress Notes (Signed)
Have contacted ride awaiting arrival

## 2012-11-05 NOTE — Op Note (Signed)
OPERATIVE REPORT  Date of Surgery: 11/05/2012  Surgeon: [name], MD  Assistant: Dionicio Stall   Pre-op Diagnosis: Right brachial to cephalic A-V fistula-to be too accessed with 2 competing branches  Post-op Diagnosis same Procedure: Procedure(s): FISTULA SUPERFICIALIZATION LIGATION OF COMPETING BRANCHES OF ARTERIOVENOUS FISTULA-right brachial to cephalic  Anesthesia: LMA  EBL: Minimal  Complications: None  Procedure Details: The patient was taken to the operating room placed in the supine position at which time satisfactory LMA anesthesia was administered. The right upper extremity was prepped with Betadine scrub and solution draped in routine sterile manner. The brachial cephalic right upper arm fistula was widely patent it was rather deep into the access. There were 2 significant competing branches one in the mid upper arm going laterally and one in the proximal upper arm going laterally. After marking the course of the fistula to incisions were made first beginning about 5-6 cm from the arterial anastomosis. The vein was carefully dissected free and was a large matured thickened fistula. It was mobilized circumferentially and using a second incision in the proximal upper arm the 2 large competing branches were identified ligated with 3-0 silk ties and divided. After completely mobilizing the vein circumferentially up to the proximal upper arm the deep tissue was reapproximated with interrupted 3-0 Vicryl sutures in order to Superficialize the vein. This was done using approximately 10 interrupted 3-0 Vicryl sutures. There was excellent Doppler flow in the vein but it was slightly smaller in the proximal upper arm and then distally. On completion of this the skin was closed with 3-0 Vicryl in subcuticular fashion with Dermabond patient taken to recovery in stable condition   [name], MD 11/05/2012 12:16 PM

## 2012-11-05 NOTE — Preoperative (Signed)
Beta Blockers   Reason not to administer Beta Blockers:Not Applicable 

## 2012-11-05 NOTE — Interval H&P Note (Signed)
History and Physical Interval Note:  11/05/2012 10:56 AM  Brittney Contreras  has presented today for surgery, with the diagnosis of ESRD;COMPLICATIONS WITH Right Arm  Arteriovenous Fistula  The various methods of treatment have been discussed with the patient and family. After consideration of risks, benefits and other options for treatment, the patient has consented to  Procedure(s) with comments: FISTULA SUPERFICIALIZATION (Right) - WITH LIGATION OF COMPETING BRANCHES as a surgical intervention .  The patient's history has been reviewed, patient examined, no change in status, stable for surgery.  I have reviewed the patient's chart and labs.  Questions were answered to the patient's satisfaction.     [name]

## 2012-11-06 ENCOUNTER — Telehealth: Payer: Self-pay | Admitting: Vascular Surgery

## 2012-11-06 NOTE — Telephone Encounter (Addendum)
Message copied by Gena Fray on Thu Nov 06, 2012  3:08 PM ------      Message from: Peter Minium K      Created: Wed Nov 05, 2012  3:02 PM      Regarding: schedule                   ----- Message -----         From: Gabriel Earing, PA-C         Sent: 11/05/2012  12:18 PM           To: Mena Goes, CMA            S/p RUA AVF superficialization 11/05/12.  F/u with Dr. Kellie Simmering in 6 weeks with duplex of AVF.            Thanks,      Aldona Bar ------  Spoke with pt to make aware of the appointment, as well as the wait in between, dpm

## 2012-11-07 ENCOUNTER — Encounter (HOSPITAL_COMMUNITY): Payer: Self-pay | Admitting: Vascular Surgery

## 2012-11-18 DIAGNOSIS — N186 End stage renal disease: Secondary | ICD-10-CM

## 2012-12-15 ENCOUNTER — Encounter: Payer: Self-pay | Admitting: Vascular Surgery

## 2012-12-16 ENCOUNTER — Encounter: Payer: Self-pay | Admitting: Vascular Surgery

## 2012-12-16 ENCOUNTER — Encounter (INDEPENDENT_AMBULATORY_CARE_PROVIDER_SITE_OTHER): Payer: Medicaid Other | Admitting: Vascular Surgery

## 2012-12-16 ENCOUNTER — Ambulatory Visit (INDEPENDENT_AMBULATORY_CARE_PROVIDER_SITE_OTHER): Payer: Medicaid Other | Admitting: Vascular Surgery

## 2012-12-16 VITALS — BP 149/102 | HR 74 | Temp 97.7°F | Resp 16 | Ht 62.5 in | Wt 281.0 lb

## 2012-12-16 DIAGNOSIS — N186 End stage renal disease: Secondary | ICD-10-CM

## 2012-12-16 DIAGNOSIS — Z4931 Encounter for adequacy testing for hemodialysis: Secondary | ICD-10-CM

## 2012-12-16 DIAGNOSIS — Z48812 Encounter for surgical aftercare following surgery on the circulatory system: Secondary | ICD-10-CM

## 2012-12-16 NOTE — Progress Notes (Signed)
Subjective:     Patient ID: Brittney Contreras, female   DOB: 10-13-1975, 37 y.o.   MRN: [id number]  HPI this 37 year old female with chronic renal insufficiency had a right brachial-cephalic AV fistula created by me in February of 2014. She didn't have superficial ulceration and ligation of competing branches performed 11/05/2012 she is not yet on hemodialysis. She is frustrated because she is not know the plans about following her kidney function. I suggested she contact the nephrology office. She returns today for followup. She has never had access to the right brachiocephalic fistula. She denies pain or numbness in the right hand   Review of Systems     Objective:   Physical Exam BP 149/102  Pulse 74  Temp(Src) 97.7 F (36.5 C) (Oral)  Resp 16  Ht 5' 2.5" (1.588 m)  Wt 281 lb (127.461 kg)  BMI 50.54 kg/m2  SpO2 100%  General well-developed well-nourished female in no apparent stress alert and oriented x3 Right upper extremity with good pulse and palpable thrill in brachiocephalic fistula. Incisions healed nicely. No evidence of ischemia and distally.  Today I ordered a fistula evaluation by duplex scanning in our office. Fistulas functioning well but there is an area of what appears to be linear stenosis near the axilla in the fistula.      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 Trula Slade

## 2012-12-19 ENCOUNTER — Other Ambulatory Visit: Payer: Self-pay

## 2012-12-30 ENCOUNTER — Ambulatory Visit (HOSPITAL_COMMUNITY)
Admission: RE | Admit: 2012-12-30 | Discharge: 2012-12-30 | Disposition: A | Discharge status: Home / Self Care | Payer: Medicaid Other | Source: Ambulatory Visit | Attending: Surgery | Admitting: Surgery

## 2012-12-30 ENCOUNTER — Encounter (HOSPITAL_COMMUNITY)
Admission: RE | Disposition: A | Discharge status: Home / Self Care | Payer: Self-pay | Source: Ambulatory Visit | Attending: Surgery

## 2012-12-30 DIAGNOSIS — Y929 Unspecified place or not applicable: Secondary | ICD-10-CM | POA: Insufficient documentation

## 2012-12-30 DIAGNOSIS — Y832 Surgical operation with anastomosis, bypass or graft as the cause of abnormal reaction of the patient, or of later complication, without mention of misadventure at the time of the procedure: Secondary | ICD-10-CM | POA: Insufficient documentation

## 2012-12-30 DIAGNOSIS — T82898A Other specified complication of vascular prosthetic devices, implants and grafts, initial encounter: Secondary | ICD-10-CM | POA: Insufficient documentation

## 2012-12-30 DIAGNOSIS — N186 End stage renal disease: Secondary | ICD-10-CM | POA: Insufficient documentation

## 2012-12-30 DIAGNOSIS — I871 Compression of vein: Secondary | ICD-10-CM | POA: Insufficient documentation

## 2012-12-30 HISTORY — PX: SHUNTOGRAM: SHX5491

## 2012-12-30 LAB — POCT I-STAT, CHEM 8
Calcium, Ion: 1.13 mmol/L (ref 1.12–1.23)
Creatinine, Ser: 3.8 mg/dL — ABNORMAL HIGH (ref 0.50–1.10)
Hemoglobin: 13.3 g/dL (ref 12.0–15.0)
Sodium: 142 mEq/L (ref 135–145)
TCO2: 21 mmol/L (ref 0–100)

## 2012-12-30 LAB — PREGNANCY, URINE: Preg Test, Ur: NEGATIVE

## 2012-12-30 SURGERY — ASSESSMENT, SHUNT FUNCTION, WITH CONTRAST RADIOGRAPHIC STUDY
Anesthesia: LOCAL | Laterality: Right

## 2012-12-30 MED ORDER — SODIUM CHLORIDE 0.9 % IJ SOLN: 3.0000 mL | INTRAMUSCULAR | Status: DC | PRN

## 2012-12-30 MED ORDER — LIDOCAINE HCL (PF) 1 % IJ SOLN
INTRAMUSCULAR | Status: AC
  Filled 2012-12-30: qty 30

## 2012-12-30 MED ORDER — HEPARIN (PORCINE) IN NACL 2-0.9 UNIT/ML-% IJ SOLN
INTRAMUSCULAR | Status: AC
  Filled 2012-12-30: qty 500

## 2012-12-30 NOTE — H&P (View-Only) (Signed)
Subjective:     Patient ID: Brittney Contreras, female   DOB: February 02, 1976, 37 y.o.   MRN: [id number]  HPI this 37 year old female with chronic renal insufficiency had a right brachial-cephalic AV fistula created by me in February of 2014. She didn't have superficial ulceration and ligation of competing branches performed 11/05/2012 she is not yet on hemodialysis. She is frustrated because she is not know the plans about following her kidney function. I suggested she contact the nephrology office. She returns today for followup. She has never had access to the right brachiocephalic fistula. She denies pain or numbness in the right hand   Review of Systems     Objective:   Physical Exam BP 149/102  Pulse 74  Temp(Src) 97.7 F (36.5 C) (Oral)  Resp 16  Ht 5' 2.5" (1.588 m)  Wt 281 lb (127.461 kg)  BMI 50.54 kg/m2  SpO2 100%  General well-developed well-nourished female in no apparent stress alert and oriented x3 Right upper extremity with good pulse and palpable thrill in brachiocephalic fistula. Incisions healed nicely. No evidence of ischemia and distally.  Today I ordered a fistula evaluation by duplex scanning in our office. Fistulas functioning well but there is an area of what appears to be linear stenosis near the axilla in the fistula.      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 Trula Slade

## 2012-12-30 NOTE — Op Note (Signed)
[facility]  Patient name: Brittney Contreras MRN: [id number] DOB: Jan 31, 1976 Sex: female  12/30/2012 Pre-operative Diagnosis: Right brachiocephalic fistula stenosis Post-operative diagnosis:  Same Surgeon:  Eldridge Abrahams Procedure Performed:  1.  ultrasound-guided access, right arm fistula  2.  fistulogram  3.  angioplasty, right cephalic vein    Indications:  The patient has undergone right brachiocephalic fistula as well as elevation. Ultrasound identified a stenosis. The patient is not yet on dialysis.  Procedure:  The patient was identified in the holding area and taken to room 8.  The patient was then placed supine on the table and prepped and draped in the usual sterile fashion.  A time out was called.  Ultrasound was used to evaluate the fistula.  The vein was patent and compressible.  A digital ultrasound image was acquired.  The fistula was then accessed under ultrasound guidance using a micropuncture needle.  An 018 wire was then asvanced without resistance and a micropuncture sheath was placed.  Contrast injections were then performed through the sheath.  Findings:  Stenosis is identified within the midportion of the brachiocephalic fistula.   Intervention:  After the above images were acquired, I decided to intervene. No heparin was administered. Over an 035 wire, a 6 French sheath was placed. I initially perform balloon angioplasty with a 6 x 40 balloon. No waste was identified with this balloon, and therefore I elected to up size to 8 x 40 balloon. This balloon was taken to 18 atmospheres. There was a small residual waist which did not go away. Followup arteriogram revealed residual stenosis, however I felt that this was improved and that with more aggressive venoplasty, I would be a risk for rupturing the vein, therefore the decision was made to stop the procedure. Catheters and wires were removed. A suture was used to close the access  site  Impression:  #1  a total of 6 mL of contrast was administered  #2  stenosis within the mid cephalic vein was identified and dilated with an 8 mm balloon.  #3  there was a residual stenosis, however for fear of rupture, I did not use a more aggressive measures to resolve this stenosis. There is good flow through the fistula.   Theotis Burrow, M.D. [facility] Office: [phone] Pager:  [phone]

## 2012-12-30 NOTE — Interval H&P Note (Signed)
History and Physical Interval Note:  12/30/2012 8:37 AM  Ronna A Godette  has presented today for surgery, with the diagnosis of instage renal/AVF with stenosis  The various methods of treatment have been discussed with the patient and family. After consideration of risks, benefits and other options for treatment, the patient has consented to  Procedure(s): FISTULOGRAM (Right) as a surgical intervention .  The patient's history has been reviewed, patient examined, no change in status, stable for surgery.  I have reviewed the patient's chart and labs.  Questions were answered to the patient's satisfaction.     BRABHAM IV, V. WELLS

## 2013-02-26 ENCOUNTER — Encounter: Payer: Self-pay | Admitting: Family Medicine

## 2013-02-26 ENCOUNTER — Ambulatory Visit (INDEPENDENT_AMBULATORY_CARE_PROVIDER_SITE_OTHER): Payer: Medicaid Other | Admitting: Family Medicine

## 2013-02-26 VITALS — BP 177/130 | HR 96 | Ht 63.0 in | Wt 281.0 lb

## 2013-02-26 DIAGNOSIS — I10 Essential (primary) hypertension: Secondary | ICD-10-CM

## 2013-02-26 DIAGNOSIS — N185 Chronic kidney disease, stage 5: Secondary | ICD-10-CM

## 2013-02-26 MED ORDER — METOPROLOL TARTRATE 50 MG PO TABS: 50.0000 mg | ORAL_TABLET | Freq: Two times a day (BID) | ORAL | Status: DC

## 2013-02-26 MED ORDER — AMLODIPINE BESYLATE 10 MG PO TABS: 10.0000 mg | ORAL_TABLET | Freq: Every day | ORAL | Status: DC

## 2013-02-26 NOTE — Assessment & Plan Note (Addendum)
Completely uncontrolled, no symptoms of increased ICP and no red flags otherwise.   Restarted amlodipine and metoprolol follow up 1-2 weeks She is seeing nephrology this afternoon, i requested a copy of her labs.  Deferred lasix as she does not look fluid overloaded on exam, although she probably is to some degree with her severe HTN, will defer to nephro for now as she has ESRD and I'd hate to be the tipping point to HD Was on 100 BID hydralazine and 80 BID lasix, will assess need for theses as she begins the other two.

## 2013-02-26 NOTE — Patient Instructions (Addendum)
It was great to meet you today!  You have very high blood pressure, this is because of missing your meds and likely fluid overload.   You have diastolic heart failure, Grade 3, this is where the heart doesn't fill as well as normally.   I have stopped you lipitor, you do not need it because of your kidney disease  Please have Dr. Deterdings office fax your labs to [place], our fax is [phone]  With you rblood pressure, if you develop chest pain, shortness of breath, weakness or numbness (esp if its on one side) seek help right away at the ER.

## 2013-02-26 NOTE — Assessment & Plan Note (Signed)
Is following with Dr. Jimmy Footman today, will defer labs to him Did not restart bicarb as she hasn't had it for 2 months and she is seeing nephro today DC'd statin, as this is not indicated in ESRD No signs of uremia, will monitor fluid status and renal function.

## 2013-02-26 NOTE — Progress Notes (Signed)
  Subjective:    Patient ID: Brittney Contreras, female    DOB: [birth year], 37 y.o.   MRN: [id number]  HPI  Pt here to establish care  HTN- BP 177/130 today,  States she has not taken meds in 2 + months as she is out of them She denies headache, chest pain, dyspnea, and weakness or numbness  Medical, social, surgical ,and family history reviewed and updated in EMR.   Review of Systems GNo fevers, chills sweats  H: No headache or vision change Mouth- no sore throat resp - no dyspnea CV- no chest pain ABD- no n/v/d or constipation GU- no dysuria EXT: no swelling    Objective:   Physical Exam  Gen: NAD, alert, cooperative with exam HEENT: NCAT, EOMI, PERRL, no papilledema appreciated.  CV: RRR, good S1/S2, no murmur Resp: CTABL, no wheezes, non-labored Ext: No edema, warm, no lesions on BL feet, 2+ DP pulses Neuro: Alert and oriented, strength 5/5 and sensation intact throughout     Assessment & Plan:

## 2013-03-12 ENCOUNTER — Ambulatory Visit: Payer: Medicaid Other | Admitting: Family Medicine

## 2013-03-23 ENCOUNTER — Encounter: Payer: Self-pay | Admitting: Family Medicine

## 2013-03-23 DIAGNOSIS — N2581 Secondary hyperparathyroidism of renal origin: Secondary | ICD-10-CM | POA: Insufficient documentation

## 2013-03-24 ENCOUNTER — Ambulatory Visit: Payer: Medicaid Other | Admitting: Family Medicine

## 2013-04-08 ENCOUNTER — Ambulatory Visit (INDEPENDENT_AMBULATORY_CARE_PROVIDER_SITE_OTHER): Payer: Medicaid Other | Admitting: Family Medicine

## 2013-04-08 ENCOUNTER — Encounter: Payer: Self-pay | Admitting: Family Medicine

## 2013-04-08 VITALS — BP 200/132 | HR 89 | Temp 98.9°F | Ht 63.0 in | Wt 280.0 lb

## 2013-04-08 DIAGNOSIS — N2581 Secondary hyperparathyroidism of renal origin: Secondary | ICD-10-CM

## 2013-04-08 DIAGNOSIS — I10 Essential (primary) hypertension: Secondary | ICD-10-CM

## 2013-04-08 DIAGNOSIS — E039 Hypothyroidism, unspecified: Secondary | ICD-10-CM | POA: Insufficient documentation

## 2013-04-08 DIAGNOSIS — Z9119 Patient's noncompliance with other medical treatment and regimen: Secondary | ICD-10-CM

## 2013-04-08 DIAGNOSIS — N185 Chronic kidney disease, stage 5: Secondary | ICD-10-CM

## 2013-04-08 LAB — TSH: TSH: 9.948 u[IU]/mL — ABNORMAL HIGH (ref 0.350–4.500)

## 2013-04-08 LAB — T4, FREE: Free T4: 0.92 ng/dL (ref 0.80–1.80)

## 2013-04-08 LAB — T3, FREE: T3, Free: 2.7 pg/mL (ref 2.3–4.2)

## 2013-04-08 MED ORDER — AMLODIPINE BESYLATE 10 MG PO TABS: 10.0000 mg | ORAL_TABLET | Freq: Every day | ORAL | Status: DC

## 2013-04-08 MED ORDER — METOPROLOL TARTRATE 50 MG PO TABS: 50.0000 mg | ORAL_TABLET | Freq: Two times a day (BID) | ORAL | Status: DC

## 2013-04-08 MED ORDER — FUROSEMIDE 40 MG PO TABS: 40.0000 mg | ORAL_TABLET | Freq: Two times a day (BID) | ORAL | Status: DC

## 2013-04-08 MED ORDER — CALCITRIOL 0.25 MCG PO CAPS: 0.2500 ug | ORAL_CAPSULE | Freq: Every day | ORAL | Status: DC

## 2013-04-08 MED ORDER — SODIUM BICARBONATE 650 MG PO TABS: 650.0000 mg | ORAL_TABLET | Freq: Two times a day (BID) | ORAL | Status: DC

## 2013-04-08 MED ORDER — HYDRALAZINE HCL 100 MG PO TABS: 100.0000 mg | ORAL_TABLET | Freq: Two times a day (BID) | ORAL | Status: DC

## 2013-04-08 NOTE — Assessment & Plan Note (Signed)
Management Per Renal Wrote for calcitriol as she states they did not and they didi indicate in their note taht they wanted it  It is hard to believe they didn't prescribe it, however I will gladly wright the rx today.

## 2013-04-08 NOTE — Assessment & Plan Note (Signed)
Again very uncontrolled today PAtient is not taking medications prescribed adn only provides excuses why No signs of end organ damage or stress today, no red flags Discussed seriousness of her HTN adn effects its having on her kidneys Discussed red flags of HTN emergency and urgency Wrote 1 year Rx for all meds on paper Discussed adding meds once daily per AVS Follow up 1 month.

## 2013-04-08 NOTE — Assessment & Plan Note (Signed)
Monthly visits with [facility] now Filled Rx per last note sent to me on 9/25 No signs of uremia or untolerable volume overload

## 2013-04-08 NOTE — Assessment & Plan Note (Signed)
Unclear as to why she is not being compliant Tried to clarify and explain her regimen, she seems to understand it Tried to discuss the severity of specifically her HTN and CKD Will continue frequent visits for now Wrote Rx on paper so electronic submission cannot be blamed.

## 2013-04-08 NOTE — Progress Notes (Signed)
  Subjective:    Patient ID: Brittney Contreras, female    DOB: May 29, 1976, 37 y.o.   MRN: [id number]  HPI  Pt here to f/u for HTN, CKD and non compliance  HTN- States pressures at home normally 300's/high 100s Denies headache, chest pain, dyspnea, edema STILL not taking meds, states pharmacy only had one despite me printing them and handing them to her last visit. States her mother filled them and pharmacy said she only had one there- iots the pink one?  CKD- saw PA at [facility] same day as last visit They want o continue BOD lasix, HCO3, and calcitriol She states they gave her one rx but she is out Still making a seemingly normal amount of urine Denies decreased appetite or agitation  Non compliance, tells a long story of people being rude to her at [facility] and the pahramcy saying she only had a single Rx when her mom went to pick it up. She does not gifve a reason for not calling and asking [place] to look into the problem.    Review of Systems Per HPI    Objective:   Physical Exam  Gen: NAD, alert, cooperative with exam HEENT: NCAT, EOM, MMM CV: RRR, good S1/S2, no murmur Resp: CTABL, no wheezes, non-labored Ext: trace to 1+ edema Neuro: Alert and oriented, No gross deficits     Assessment & Plan:  See prob specific a/p

## 2013-04-08 NOTE — Patient Instructions (Signed)
It was great to see you today  I have refilled all of your meds in paper, be sure to keep up with which ones you get  Start your meds as follows: Day 1 - calictriol, sodium bicarbonate, lasix Day 2 - add amlodipine Day 3 - add metoprolol Day 4 - add hydralazine   Our goal is 130/80, check your BP daily and write it down  Hypertension As your heart beats, it forces blood through your arteries. This force is your blood pressure. If the pressure is too high, it is called hypertension (HTN) or high blood pressure. HTN is dangerous because you may have it and not know it. High blood pressure may mean that your heart has to work harder to pump blood. Your arteries may be narrow or stiff. The extra work puts you at risk for heart disease, stroke, and other problems.  Blood pressure consists of two numbers, a higher number over a lower, 110/72, for example. It is stated as "110 over 72." The ideal is below 120 for the top number (systolic) and under 80 for the bottom (diastolic). Write down your blood pressure today. You should pay close attention to your blood pressure if you have certain conditions such as:  Heart failure.  Prior heart attack.  Diabetes  Chronic kidney disease.  Prior stroke.  Multiple risk factors for heart disease. To see if you have HTN, your blood pressure should be measured while you are seated with your arm held at the level of the heart. It should be measured at least twice. A one-time elevated blood pressure reading (especially in the Emergency Department) does not mean that you need treatment. There may be conditions in which the blood pressure is different between your right and left arms. It is important to see your caregiver soon for a recheck. Most people have essential hypertension which means that there is not a specific cause. This type of high blood pressure may be lowered by changing lifestyle factors such as:  Stress.  Smoking.  Lack of  exercise.  Excessive weight.  Drug/tobacco/alcohol use.  Eating less salt. Most people do not have symptoms from high blood pressure until it has caused damage to the body. Effective treatment can often prevent, delay or reduce that damage. TREATMENT  When a cause has been identified, treatment for high blood pressure is directed at the cause. There are a large number of medications to treat HTN. These fall into several categories, and your caregiver will help you select the medicines that are best for you. Medications may have side effects. You should review side effects with your caregiver. If your blood pressure stays high after you have made lifestyle changes or started on medicines,   Your medication(s) may need to be changed.  Other problems may need to be addressed.  Be certain you understand your prescriptions, and know how and when to take your medicine.  Be sure to follow up with your caregiver within the time frame advised (usually within two weeks) to have your blood pressure rechecked and to review your medications.  If you are taking more than one medicine to lower your blood pressure, make sure you know how and at what times they should be taken. Taking two medicines at the same time can result in blood pressure that is too low. SEEK IMMEDIATE MEDICAL CARE IF:  You develop a severe headache, blurred or changing vision, or confusion.  You have unusual weakness or numbness, or a faint feeling.  You have severe chest or abdominal pain, vomiting, or breathing problems. MAKE SURE YOU:   Understand these instructions.  Will watch your condition.  Will get help right away if you are not doing well or get worse. Document Released: 05/21/2005 Document Revised: 08/13/2011 Document Reviewed: 01/09/2008 [facility] Patient Information 2014 [place].

## 2013-04-16 ENCOUNTER — Telehealth: Payer: Self-pay | Admitting: Family Medicine

## 2013-04-17 NOTE — Telephone Encounter (Signed)
Called patient to discuss recent labs.   She has elevated TSH but normal T4 and T3 indicating subclinical hypothyroidism.   Discussed signs and symptoms of hypothyroidism and gave her the choice about taking synthroid. As she is not having any symptoms she declines, I agree and will plan to check TSH and T4 again in 6 months or if symptoms arise.  She has not gotten her blood pressure medicines yet, so I encouraged that. She states that she will get them tomorrow. I offered any help that I can give and ask her to call the she needs anything.  Laroy Apple, MD [place] Resident, PGY-2 04/17/2013, 3:06 PM

## 2014-02-09 ENCOUNTER — Telehealth: Payer: Self-pay | Admitting: Family Medicine

## 2014-02-09 DIAGNOSIS — I10 Essential (primary) hypertension: Secondary | ICD-10-CM

## 2014-02-09 DIAGNOSIS — N185 Chronic kidney disease, stage 5: Secondary | ICD-10-CM

## 2014-02-09 MED ORDER — FUROSEMIDE 40 MG PO TABS: 40.0000 mg | ORAL_TABLET | Freq: Two times a day (BID) | ORAL | Status: DC

## 2014-02-09 MED ORDER — METOPROLOL TARTRATE 50 MG PO TABS: 50.0000 mg | ORAL_TABLET | Freq: Two times a day (BID) | ORAL | Status: DC

## 2014-02-09 MED ORDER — AMLODIPINE BESYLATE 10 MG PO TABS: 10.0000 mg | ORAL_TABLET | Freq: Every day | ORAL | Status: DC

## 2014-02-09 MED ORDER — HYDRALAZINE HCL 100 MG PO TABS: 100.0000 mg | ORAL_TABLET | Freq: Two times a day (BID) | ORAL | Status: DC

## 2014-02-09 NOTE — Telephone Encounter (Signed)
Refilled Amlodipine, lasix, hydralazine, and BB. She will need to call [facility] for calitriol and sodium bicarb as they prescribe those.   Pt does need appt so only given 1 refill of each. Will ask staff to call and ask her to sched appt and let her know I sent meds.   Laroy Apple, MD [place] Resident, PGY-3 02/09/2014, 2:25 PM

## 2014-02-09 NOTE — Telephone Encounter (Signed)
Left message at number provided informing of message from MD.

## 2014-02-09 NOTE — Telephone Encounter (Signed)
Pt called in her 6 meds last week.  She always throws away her bottles after she calls in her meds. When her mother went to pick up her meds, pharmacy said no one had called in her meds She still has refills left but just doesn't have the numbers Refills needed on amlodipine, calcitriol, furosemide, hydra;alazine, metoprolol and sodium bicarbonate. [facility] is the pharmacy Her number until 1:30 is [phone] After 1:30 leave message at [phone]

## 2014-04-14 IMAGING — CR DG CHEST 2V
2 series · 2 of 2 positions shown · non-contrast
Comparison: No priors.

CLINICAL DATA: Shortness of breath and cough.

CHEST - 2 VIEW

[w chest pa]
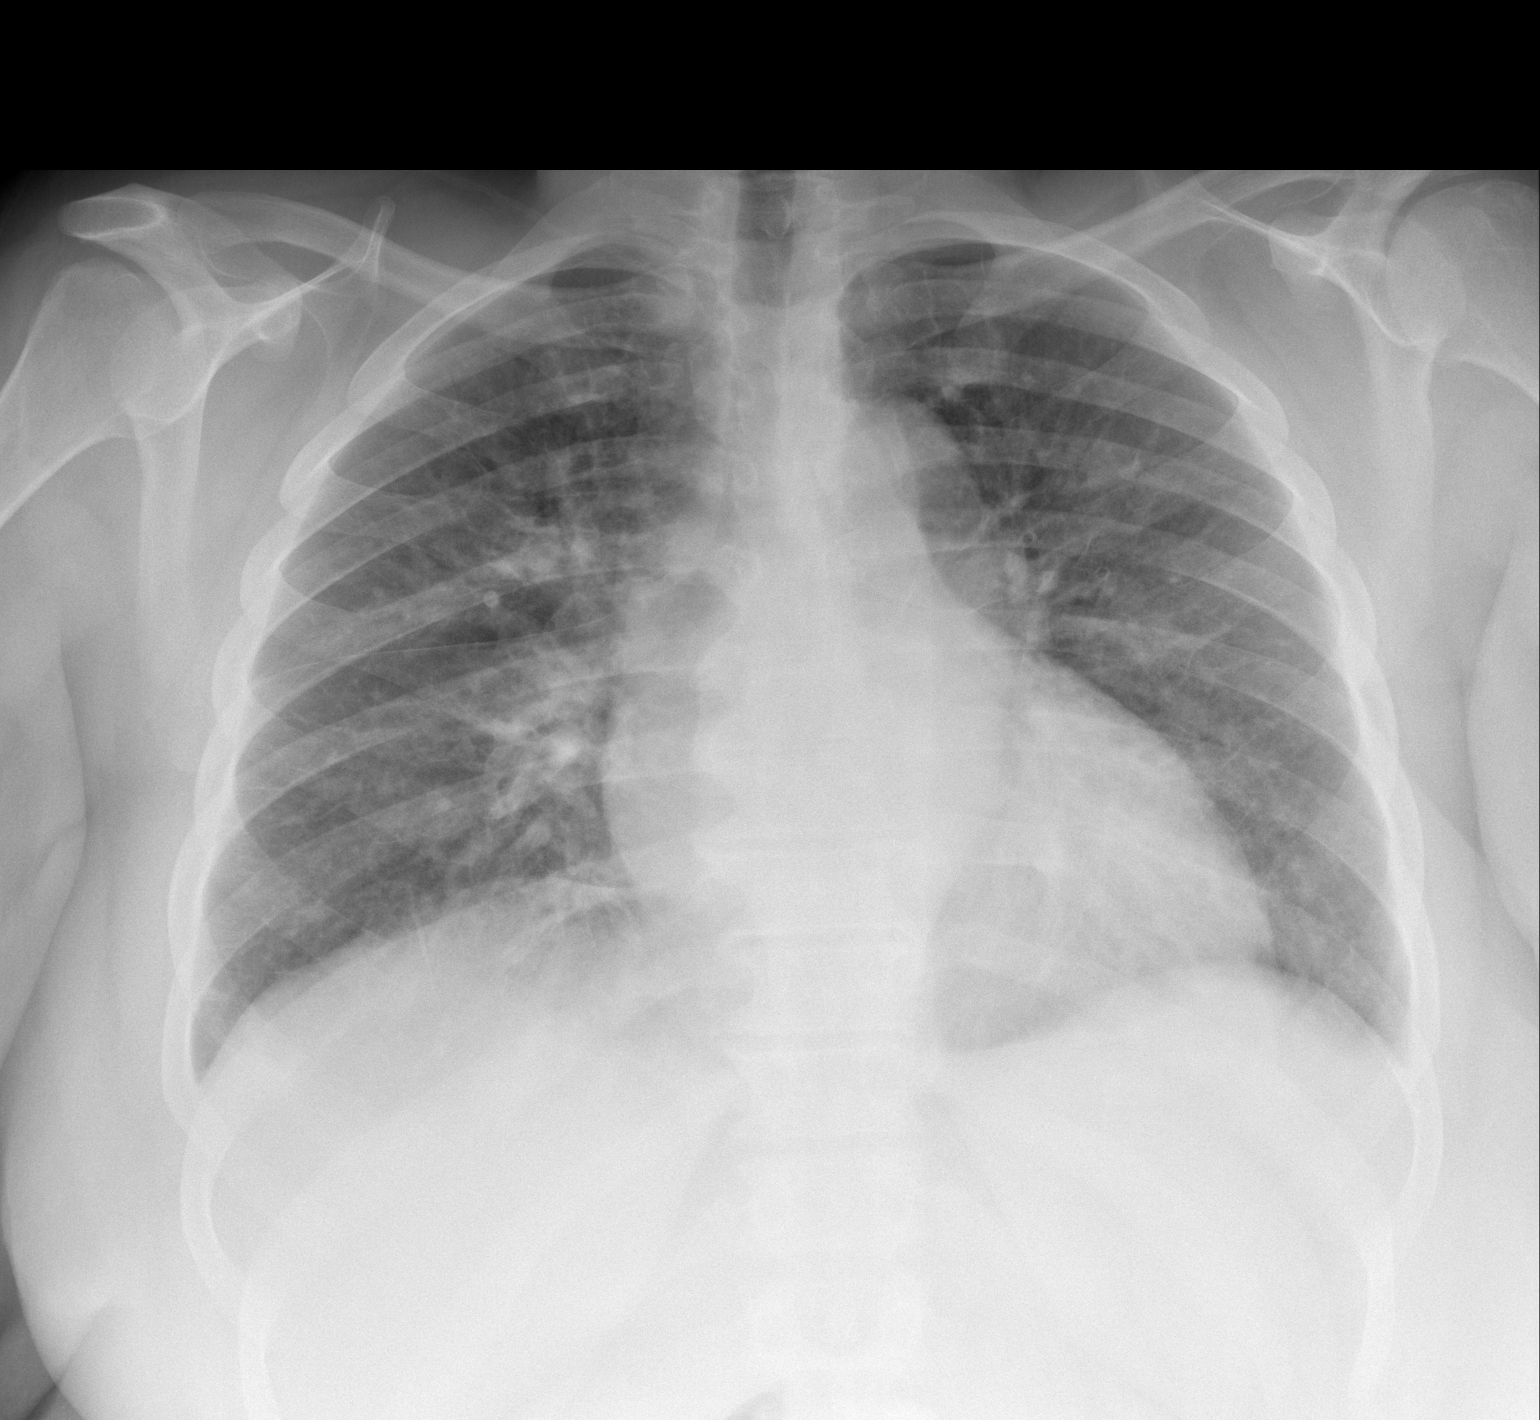

[w chest lat]
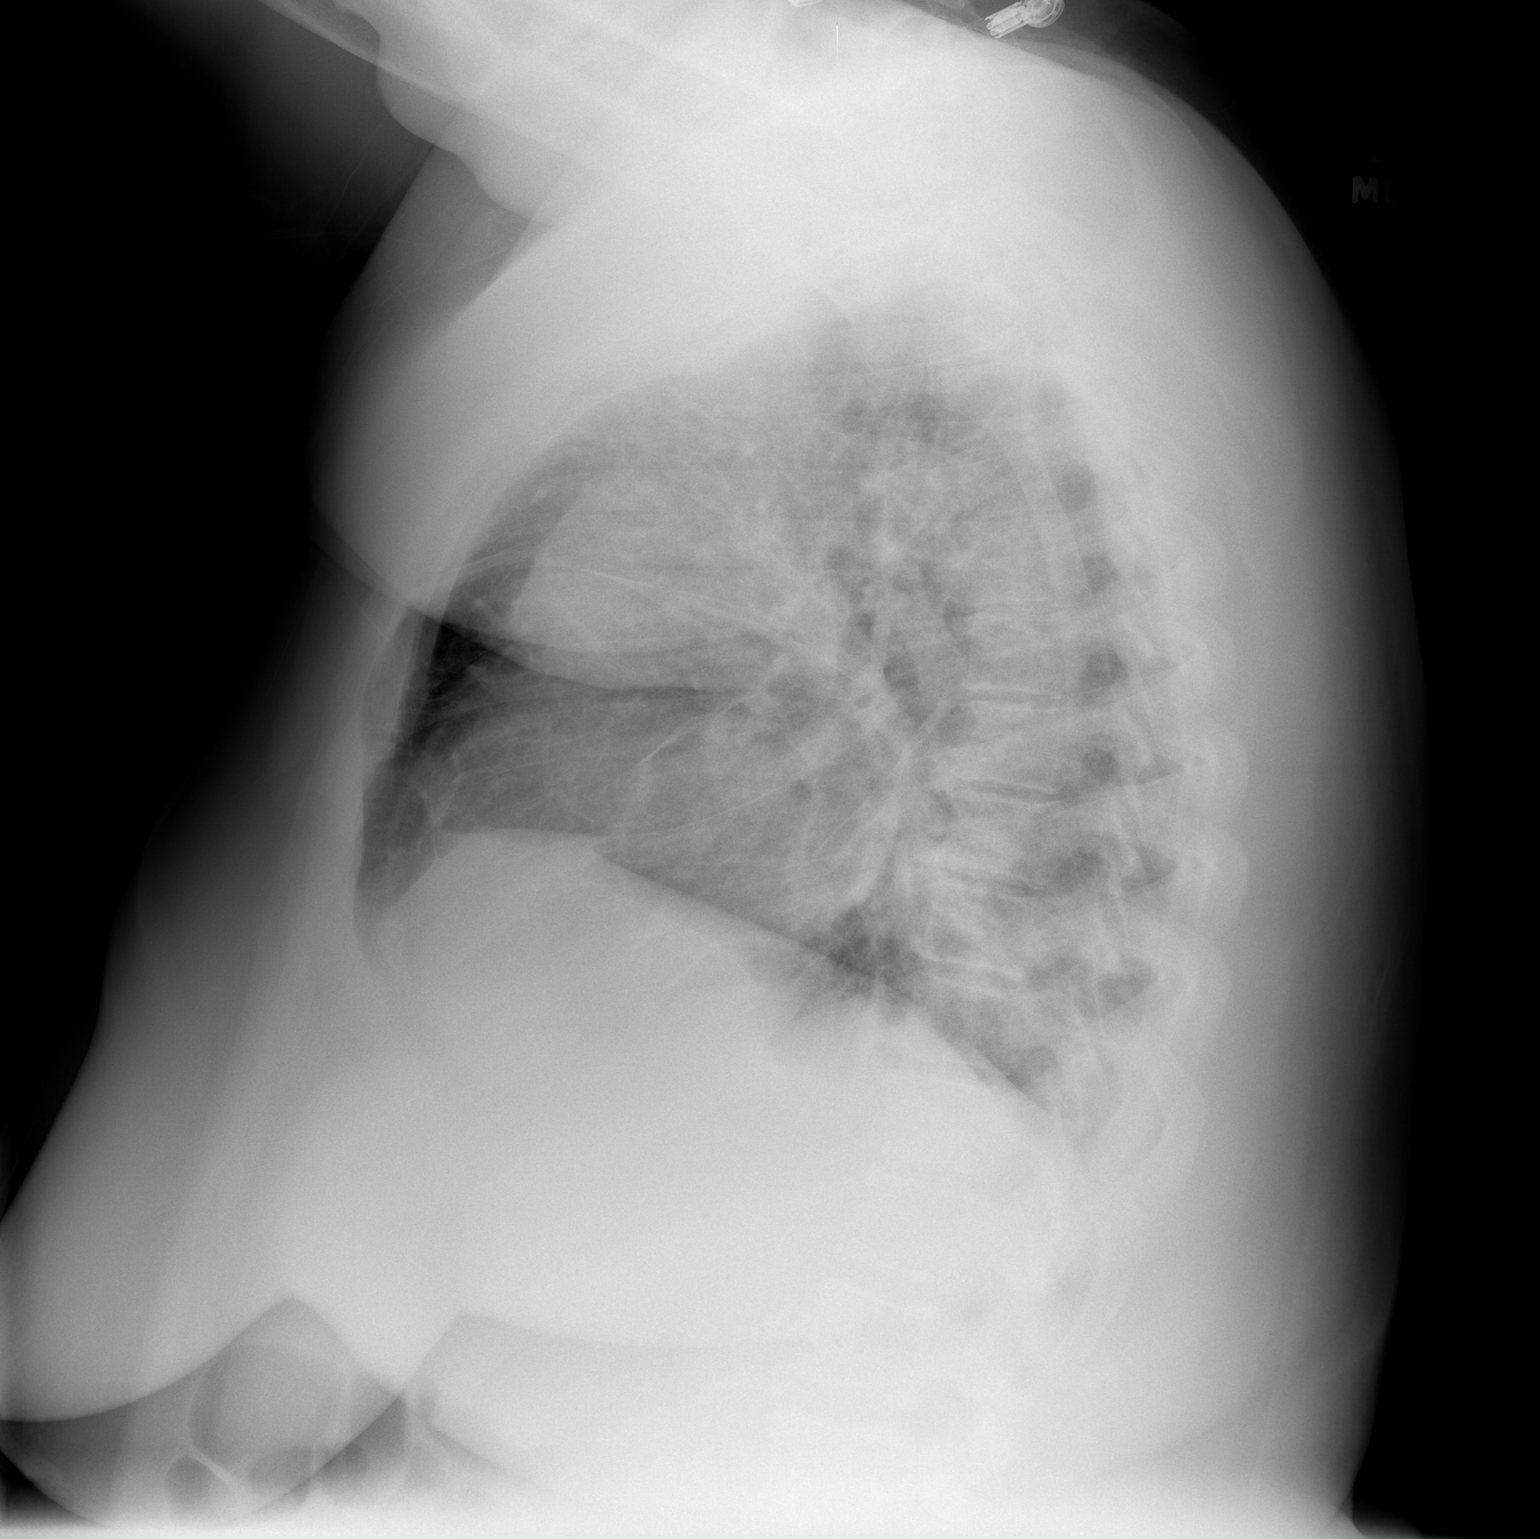

[2 of 2 positions shown; findings below may reference images not displayed]

FINDINGS: Lung volumes are normal.  No acute consolidative air
space disease.  Mild diffuse interstitial prominence with some
Kerley B lines in the periphery of the lungs.  Cephalization of the
pulmonary vasculature.  Borderline cardiomegaly.  Upper mediastinal
contours are within normal limits.  No definite pleural effusions.
IMPRESSION: 1.  Borderline cardiomegaly with findings suspicious for mild
interstitial pulmonary edema.  Clinical correlation for signs and
symptoms of mild congestive heart failure is recommended.

## 2014-05-13 ENCOUNTER — Encounter (HOSPITAL_COMMUNITY): Payer: Self-pay | Admitting: Surgery

## 2014-06-16 ENCOUNTER — Ambulatory Visit (INDEPENDENT_AMBULATORY_CARE_PROVIDER_SITE_OTHER): Payer: Medicaid Other | Admitting: Family Medicine

## 2014-06-16 ENCOUNTER — Telehealth: Payer: Self-pay | Admitting: Family Medicine

## 2014-06-16 ENCOUNTER — Encounter: Payer: Self-pay | Admitting: Family Medicine

## 2014-06-16 VITALS — BP 249/143 | HR 90 | Temp 98.5°F | Ht 63.0 in | Wt 256.9 lb

## 2014-06-16 DIAGNOSIS — Z9119 Patient's noncompliance with other medical treatment and regimen: Secondary | ICD-10-CM

## 2014-06-16 DIAGNOSIS — Z91199 Patient's noncompliance with other medical treatment and regimen due to unspecified reason: Secondary | ICD-10-CM

## 2014-06-16 DIAGNOSIS — I10 Essential (primary) hypertension: Secondary | ICD-10-CM

## 2014-06-16 DIAGNOSIS — N185 Chronic kidney disease, stage 5: Secondary | ICD-10-CM

## 2014-06-16 DIAGNOSIS — R011 Cardiac murmur, unspecified: Secondary | ICD-10-CM | POA: Insufficient documentation

## 2014-06-16 LAB — COMPREHENSIVE METABOLIC PANEL
ALBUMIN: 4.1 g/dL (ref 3.5–5.2)
ALK PHOS: 64 U/L (ref 39–117)
ALT: <8 U/L (ref 0–35)
AST: 14 U/L (ref 0–37)
BILIRUBIN TOTAL: 1 mg/dL (ref 0.2–1.2)
BUN: 26 mg/dL — AB (ref 6–23)
CO2: 19 mEq/L (ref 19–32)
Calcium: 9.1 mg/dL (ref 8.4–10.5)
Chloride: 108 mEq/L (ref 96–112)
Creat: 2.44 mg/dL — ABNORMAL HIGH (ref 0.50–1.10)
GLUCOSE: 64 mg/dL — AB (ref 70–99)
POTASSIUM: 4.2 meq/L (ref 3.5–5.3)
Sodium: 140 mEq/L (ref 135–145)
Total Protein: 7.2 g/dL (ref 6.0–8.3)

## 2014-06-16 LAB — CBC WITH DIFFERENTIAL/PLATELET
Basophils Absolute: 0 10*3/uL (ref 0.0–0.1)
Basophils Relative: 0 % (ref 0–1)
EOS ABS: 0.1 10*3/uL (ref 0.0–0.7)
EOS PCT: 1 % (ref 0–5)
HCT: 47.7 % — ABNORMAL HIGH (ref 36.0–46.0)
Hemoglobin: 15.9 g/dL — ABNORMAL HIGH (ref 12.0–15.0)
LYMPHS ABS: 1.6 10*3/uL (ref 0.7–4.0)
LYMPHS PCT: 15 % (ref 12–46)
MCH: 28.6 pg (ref 26.0–34.0)
MCHC: 33.3 g/dL (ref 30.0–36.0)
MCV: 85.9 fL (ref 78.0–100.0)
MONO ABS: 0.7 10*3/uL (ref 0.1–1.0)
MONOS PCT: 6 % (ref 3–12)
Neutro Abs: 8.5 10*3/uL — ABNORMAL HIGH (ref 1.7–7.7)
Neutrophils Relative %: 78 % — ABNORMAL HIGH (ref 43–77)
PLATELETS: 183 10*3/uL (ref 150–400)
RBC: 5.55 MIL/uL — AB (ref 3.87–5.11)
RDW: 15.7 % — AB (ref 11.5–15.5)
WBC: 10.9 10*3/uL — ABNORMAL HIGH (ref 4.0–10.5)

## 2014-06-16 MED ORDER — AMLODIPINE BESYLATE 10 MG PO TABS: 10.0000 mg | ORAL_TABLET | Freq: Every day | ORAL | Status: DC

## 2014-06-16 MED ORDER — FUROSEMIDE 40 MG PO TABS: 40.0000 mg | ORAL_TABLET | Freq: Two times a day (BID) | ORAL | Status: DC

## 2014-06-16 MED ORDER — METOPROLOL TARTRATE 50 MG PO TABS: 50.0000 mg | ORAL_TABLET | Freq: Two times a day (BID) | ORAL | Status: DC

## 2014-06-16 MED ORDER — HYDRALAZINE HCL 100 MG PO TABS: 100.0000 mg | ORAL_TABLET | Freq: Two times a day (BID) | ORAL | Status: DC

## 2014-06-16 NOTE — Assessment & Plan Note (Signed)
Loud systolic murmur which I don't see documentation for previously Previous echo with mild mitral regurg Recheck echo

## 2014-06-16 NOTE — Telephone Encounter (Signed)
Called and discussed BP. I asked her to make sure she is taking her BP meds and follow up in 1 week for a nurse appt to check BP.  She agrees and will come in for a check.   Laroy Apple, MD [place] Resident, PGY-3 06/16/2014, 4:52 PM

## 2014-06-16 NOTE — Progress Notes (Addendum)
Patient ID: Brittney Contreras, female   DOB: Dec 22, 1975, 39 y.o.   MRN: [id number]   HPI  Patient presents today for follow-up and refill on meds  Hypertension Denies chest pain, headache, dyspnea, leg edema, palpitations Has not been taking her medications for 6 months, she states this is because she has not had refills Trying to watch her diet, avoiding salty foods  Healthcare maintenance Hasn't had a Pap smear in about 10 years, she is agreeable to have one next time we see each other  Smoking status noted ROS: Per HPI  Objective: BP 249/143 mmHg  Pulse 90  Temp(Src) 98.5 F (36.9 C)  Ht 5\' 3"  (1.6 m)  Wt 256 lb 14.4 oz (116.529 kg)  BMI 45.52 kg/m2  LMP 05/26/2014 Gen: NAD, alert, cooperative with exam, obese HEENT: NCAT CV: RRR, good S1/S2, [id number] holosystolic murmur Resp: CTABL, no wheezes, non-labored Ext: No edema, warm Neuro: Alert and oriented, No gross deficits, normal gait  Assessment and plan:  Systolic murmur Loud systolic murmur which I don't see documentation for previously Previous echo with mild mitral regurg Recheck echo   Noncompliance Noncompliance with meds, she now blames this on not having refills Encouraged her today, provided one year refills with 90 day supply each    HTN (hypertension) Severe range hypertension today, noncompliant with medications Refilled her previous medications including amlodipine, hydralazine, Lasix No red flags today-no chest pain, dyspnea, headaches, palpitations Discussed red flags including chest pain headache and dyspnea with patient with a discharge and cystic elbow in the ER if these happen Check labs today Asked her  To Follow-up in one month, will also call and ask her to follow-up in one week after taking medications with a nurse for blood pressure check   CKD (chronic kidney disease) stage 5, GFR less than 15 ml/min Refilled meds today, including Lasix Did not give sodium bicarbonate Asked her to  Follow-up with [place] [facility] Recheck labs today     Orders Placed This Encounter  Procedures  . Comprehensive metabolic panel  . CBC with Differential  . 2D Echocardiogram with contrast    Standing Status: Future     Number of Occurrences:      Standing Expiration Date: 06/17/2015    Order Specific Question:  Type of Echo    Answer:  Complete    Order Specific Question:  Where should this test be performed    Answer:  [facility]    Order Specific Question:  Reason for exam-Echo    Answer:  Murmur  785.2 / R01.1    Meds ordered this encounter  Medications  . furosemide (LASIX) 40 MG tablet    Sig: Take 1 tablet (40 mg total) by mouth 2 (two) times daily.    Dispense:  180 tablet    Refill:  3  . amLODipine (NORVASC) 10 MG tablet    Sig: Take 1 tablet (10 mg total) by mouth at bedtime.    Dispense:  90 tablet    Refill:  3  . hydrALAZINE (APRESOLINE) 100 MG tablet    Sig: Take 1 tablet (100 mg total) by mouth 2 (two) times daily.    Dispense:  180 tablet    Refill:  3  . metoprolol (LOPRESSOR) 50 MG tablet    Sig: Take 1 tablet (50 mg total) by mouth 2 (two) times daily.    Dispense:  180 tablet    Refill:  3

## 2014-06-16 NOTE — Assessment & Plan Note (Signed)
Refilled meds today, including Lasix Did not give sodium bicarbonate Asked her to Follow-up with [facility] Recheck labs today

## 2014-06-16 NOTE — Telephone Encounter (Signed)
Ms. Brittney Contreras called back to give another number provider can call for information.  [phone]

## 2014-06-16 NOTE — Assessment & Plan Note (Signed)
Noncompliance with meds, she now blames this on not having refills Encouraged her today, provided one year refills with 90 day supply each

## 2014-06-16 NOTE — Patient Instructions (Addendum)
Great to see you!  Let me know if there are problems with your medicines, I will let you know about your labs  Please make a follow up appt at [facility]  Hypertension Hypertension, commonly called high blood pressure, is when the force of blood pumping through your arteries is too strong. Your arteries are the blood vessels that carry blood from your heart throughout your body. A blood pressure reading consists of a higher number over a lower number, such as 110/72. The higher number (systolic) is the pressure inside your arteries when your heart pumps. The lower number (diastolic) is the pressure inside your arteries when your heart relaxes. Ideally you want your blood pressure below 120/80. Hypertension forces your heart to work harder to pump blood. Your arteries may become narrow or stiff. Having hypertension puts you at risk for heart disease, stroke, and other problems.  RISK FACTORS Some risk factors for high blood pressure are controllable. Others are not.  Risk factors you cannot control include:   Race. You may be at higher risk if you are African American.  Age. Risk increases with age.  Gender. Men are at higher risk than women before age 82 years. After age 106, women are at higher risk than men. Risk factors you can control include:  Not getting enough exercise or physical activity.  Being overweight.  Getting too much fat, sugar, calories, or salt in your diet.  Drinking too much alcohol. SIGNS AND SYMPTOMS Hypertension does not usually cause signs or symptoms. Extremely high blood pressure (hypertensive crisis) may cause headache, anxiety, shortness of breath, and nosebleed. DIAGNOSIS  To check if you have hypertension, your health care provider will measure your blood pressure while you are seated, with your arm held at the level of your heart. It should be measured at least twice using the same arm. Certain conditions can cause a difference in blood pressure  between your right and left arms. A blood pressure reading that is higher than normal on one occasion does not mean that you need treatment. If one blood pressure reading is high, ask your health care provider about having it checked again. TREATMENT  Treating high blood pressure includes making lifestyle changes and possibly taking medicine. Living a healthy lifestyle can help lower high blood pressure. You may need to change some of your habits. Lifestyle changes may include:  Following the DASH diet. This diet is high in fruits, vegetables, and whole grains. It is low in salt, red meat, and added sugars.  Getting at least 2 hours of brisk physical activity every week.  Losing weight if necessary.  Not smoking.  Limiting alcoholic beverages.  Learning ways to reduce stress. If lifestyle changes are not enough to get your blood pressure under control, your health care provider may prescribe medicine. You may need to take more than one. Work closely with your health care provider to understand the risks and benefits. HOME CARE INSTRUCTIONS  Have your blood pressure rechecked as directed by your health care provider.   Take medicines only as directed by your health care provider. Follow the directions carefully. Blood pressure medicines must be taken as prescribed. The medicine does not work as well when you skip doses. Skipping doses also puts you at risk for problems.   Do not smoke.   Monitor your blood pressure at home as directed by your health care provider. SEEK MEDICAL CARE IF:   You think you are having a reaction to medicines taken.  You have recurrent headaches or feel dizzy.  You have swelling in your ankles.  You have trouble with your vision. SEEK IMMEDIATE MEDICAL CARE IF:  You develop a severe headache or confusion.  You have unusual weakness, numbness, or feel faint.  You have severe chest or abdominal pain.  You vomit repeatedly.  You have trouble  breathing. MAKE SURE YOU:   Understand these instructions.  Will watch your condition.  Will get help right away if you are not doing well or get worse. Document Released: 05/21/2005 Document Revised: 10/05/2013 Document Reviewed: 03/13/2013 [facility] Patient Information 2015 [place], [place]. This information is not intended to replace advice given to you by your health care provider. Make sure you discuss any questions you have with your health care provider.

## 2014-06-16 NOTE — Assessment & Plan Note (Signed)
Severe range hypertension today, noncompliant with medications Refilled her previous medications including amlodipine, hydralazine, Lasix No red flags today-no chest pain, dyspnea, headaches, palpitations Discussed red flags including chest pain headache and dyspnea with patient with a discharge and cystic elbow in the ER if these happen Check labs today Asked her  To Follow-up in one month, will also call and ask her to follow-up in one week after taking medications with a nurse for blood pressure check

## 2014-06-17 ENCOUNTER — Encounter: Payer: Self-pay | Admitting: Family Medicine

## 2014-06-18 ENCOUNTER — Ambulatory Visit (HOSPITAL_COMMUNITY)
Admission: RE | Admit: 2014-06-18 | Discharge: 2014-06-18 | Disposition: A | Discharge status: Home / Self Care | Payer: Medicaid Other | Source: Ambulatory Visit | Attending: Family Medicine | Admitting: Family Medicine

## 2014-06-18 DIAGNOSIS — I059 Rheumatic mitral valve disease, unspecified: Secondary | ICD-10-CM

## 2014-06-18 DIAGNOSIS — I10 Essential (primary) hypertension: Secondary | ICD-10-CM | POA: Insufficient documentation

## 2014-06-18 DIAGNOSIS — R011 Cardiac murmur, unspecified: Secondary | ICD-10-CM | POA: Diagnosis not present

## 2014-06-18 NOTE — Progress Notes (Signed)
  Echocardiogram 2D Echocardiogram has been performed.  Brittney Contreras 06/18/2014, 1:57 PM

## 2014-06-21 ENCOUNTER — Telehealth: Payer: Self-pay | Admitting: Family Medicine

## 2014-06-21 DIAGNOSIS — N185 Chronic kidney disease, stage 5: Secondary | ICD-10-CM

## 2014-06-21 NOTE — Telephone Encounter (Signed)
Pt called because she said she takes 6 medications daily and only received 4 medications. She wanted to know when he was going to call in the rest of them. There is some confusion on which medications she still needed and on what she picked up. Please call to verify. Please call patient at [phone]. jw

## 2014-06-22 MED ORDER — CALCITRIOL 0.25 MCG PO CAPS: 0.2500 ug | ORAL_CAPSULE | Freq: Every day | ORAL | Status: DC

## 2014-06-22 NOTE — Telephone Encounter (Signed)
Called to discuss but no answer. Left VM. It is very difficult, in general, to get her on the phone so I wrote a letter. I refilled calcitriol but am not sure she needs sodium bicarb, these are usually managed by nephrologists.   Laroy Apple, MD [place] Resident, PGY-3 06/22/2014, 10:32 AM

## 2014-06-23 ENCOUNTER — Ambulatory Visit (INDEPENDENT_AMBULATORY_CARE_PROVIDER_SITE_OTHER): Payer: Medicaid Other | Admitting: *Deleted

## 2014-06-23 VITALS — BP 240/140 | HR 97

## 2014-06-23 DIAGNOSIS — Z0130 Encounter for examination of blood pressure without abnormal findings: Secondary | ICD-10-CM

## 2014-06-23 DIAGNOSIS — Z136 Encounter for screening for cardiovascular disorders: Secondary | ICD-10-CM

## 2014-06-23 DIAGNOSIS — I10 Essential (primary) hypertension: Secondary | ICD-10-CM

## 2014-06-23 NOTE — Progress Notes (Signed)
   Pt in nurse clinic for blood pressure check.  BP 240/140 manually, heart rate 97.  Pt denies any chest pain, SOB, dizziness, headache or visual changes.  Pt stated she did not take any of her blood pressure medications this morning.  Precept with Dr. Lindell Noe; have pt be seen in clinic by a provider today to monitor blood pressure and to give pt hydralazine in clinic.  Pt advised to see a provider today, but declined.  Pt stated she was not going to wait to see a provider today, she was ready to leave [place] and go back to [place].  Pt stated she has an appt with PCP 07/14/2014.  Pt advised she really should stay to see a provider regarding her blood pressure.  If she left; make sure she take her blood pressure medication as prescribed. If she develops any chest pain, SOB, dizziness or headaches to call clinic or go to nearest ER or urgent care.  Pt stated understanding.  Will forward to PCP.  Derl Barrow, RN

## 2014-07-14 ENCOUNTER — Ambulatory Visit: Payer: Medicaid Other | Admitting: Family Medicine

## 2014-07-21 ENCOUNTER — Ambulatory Visit: Payer: Medicaid Other | Admitting: Family Medicine

## 2014-07-30 ENCOUNTER — Encounter: Payer: Self-pay | Admitting: Family Medicine

## 2014-08-10 ENCOUNTER — Ambulatory Visit: Payer: Medicaid Other | Admitting: Family Medicine

## 2014-08-17 ENCOUNTER — Ambulatory Visit: Payer: Medicaid Other | Admitting: Family Medicine

## 2014-08-24 ENCOUNTER — Ambulatory Visit: Payer: Medicaid Other | Admitting: Family Medicine

## 2014-10-05 ENCOUNTER — Encounter: Payer: Self-pay | Admitting: Family Medicine

## 2014-10-05 ENCOUNTER — Other Ambulatory Visit (HOSPITAL_COMMUNITY)
Admission: RE | Admit: 2014-10-05 | Discharge: 2014-10-05 | Disposition: A | Discharge status: Home / Self Care | Payer: Medicaid Other | Source: Ambulatory Visit | Attending: Family Medicine | Admitting: Family Medicine

## 2014-10-05 ENCOUNTER — Ambulatory Visit (INDEPENDENT_AMBULATORY_CARE_PROVIDER_SITE_OTHER): Payer: Medicaid Other | Admitting: Family Medicine

## 2014-10-05 VITALS — BP 136/88 | HR 90 | Temp 98.4°F | Ht 63.0 in | Wt 248.2 lb

## 2014-10-05 DIAGNOSIS — Z1151 Encounter for screening for human papillomavirus (HPV): Secondary | ICD-10-CM | POA: Diagnosis present

## 2014-10-05 DIAGNOSIS — Z124 Encounter for screening for malignant neoplasm of cervix: Secondary | ICD-10-CM

## 2014-10-05 DIAGNOSIS — N184 Chronic kidney disease, stage 4 (severe): Secondary | ICD-10-CM | POA: Diagnosis not present

## 2014-10-05 DIAGNOSIS — Z0000 Encounter for general adult medical examination without abnormal findings: Secondary | ICD-10-CM | POA: Diagnosis not present

## 2014-10-05 DIAGNOSIS — Z01419 Encounter for gynecological examination (general) (routine) without abnormal findings: Secondary | ICD-10-CM | POA: Insufficient documentation

## 2014-10-05 NOTE — Progress Notes (Signed)
Patient ID: Brittney Contreras, female   DOB: Sep 13, 1975, 39 y.o.   MRN: [id number]   HPI  Patient presents today for follow-up Pap smear  Hypertension Complaint with meds - Yes Pertinent ROS:  Headache - No Chest pain - No Dyspnea - No Palpitations - No LE edema - Yes  Well woman exam Hasn't had a Pap smear since 39 years old, normal then Not sexually active, no concerns with STI's Also denies vaginal discharge or irritation. Has regular periods every 4 weeks that last 2 days with normal amount of bleeding   Smoking status noted ROS: Per HPI  Objective: BP 136/88 mmHg  Pulse 90  Temp(Src) 98.4 F (36.9 C) (Oral)  Ht 5\' 3"  (1.6 m)  Wt 248 lb 3.2 oz (112.583 kg)  BMI 43.98 kg/m2  LMP 09/14/2014 Gen: NAD, alert, cooperative with exam HEENT: NCAT CV: RRR, good S1/S2, no murmur Resp: CTABL, no wheezes, non-labored Ext: No edema, warm Neuro: Alert and oriented, No gross deficits  GU: Normal-appearing per him, vaginal walls pink, cervix not visualized, cervix also not palpated on bimanual exam, no adnexal fullness or tenderness  Assessment and plan:  HTN (hypertension) Well controlled, compliance improved No red flags Following with renal and had labs this am.    CKD (chronic kidney disease) stage 4, GFR 15-29 ml/min Following with Dr. Jimmy Footman at [facility] Had labs this am, will wait for forward from renal   Well woman exam Pap performed today Difficult to see cervix, could not palpate on bimanual If inadequate would refer to GYN given difficulty on my exam - likely due to habitus

## 2014-10-05 NOTE — Assessment & Plan Note (Signed)
Following with Dr. Jimmy Footman at [facility] Had labs this am, will wait for forward from renal

## 2014-10-05 NOTE — Assessment & Plan Note (Signed)
Well controlled, compliance improved No red flags Following with renal and had labs this am.

## 2014-10-05 NOTE — Patient Instructions (Signed)
Great to see you!  Your blood pressure looks great!  Please come back in 6 months, Continue to follow up with renal as you are  I will send a letter or call with your results

## 2014-10-05 NOTE — Assessment & Plan Note (Signed)
Pap performed today Difficult to see cervix, could not palpate on bimanual If inadequate would refer to GYN given difficulty on my exam - likely due to habitus

## 2014-10-06 NOTE — Progress Notes (Signed)
I was preceptor for this office visit.  

## 2014-10-07 ENCOUNTER — Telehealth: Payer: Self-pay | Admitting: Family Medicine

## 2014-10-07 DIAGNOSIS — Z01419 Encounter for gynecological examination (general) (routine) without abnormal findings: Secondary | ICD-10-CM

## 2014-10-07 LAB — CYTOLOGY - PAP

## 2014-10-07 NOTE — Telephone Encounter (Signed)
Attempted to call pt but she wasn't available. Left message with whoever answered the phone for her to call us back. Deseree Kennon Holter, CMA

## 2014-10-07 NOTE — Telephone Encounter (Signed)
Called to discuss pap results. Her pap smear was difficult as her cervix was difficult to see. I expplained that it may be inadequate and it was.   Given difficulty I would recommend seeing GYN, which I explained would be a possibility to her yesterday.   I couldn't get her on the phone so I will ask nursing to let her know that as I we discussed and suspected her pap smear was inadequate and I have referred her to GYN. If she has other questions I will be glad to try and call her again.   Laroy Apple, MD Home Resident, PGY-3 10/07/2014, 3:50 PM

## 2014-10-08 ENCOUNTER — Encounter: Payer: Self-pay | Admitting: Obstetrics and Gynecology

## 2014-10-08 NOTE — Telephone Encounter (Signed)
Pt returning call, stated that someone may return her call at a different number, [phone]. She stated that you may leave a message at this number. Thanks, [employer], ASA

## 2014-10-08 NOTE — Telephone Encounter (Signed)
Pt informed. Dantae Meunier, CMA  

## 2014-11-08 ENCOUNTER — Encounter: Payer: Medicaid Other | Admitting: Obstetrics and Gynecology

## 2015-07-01 ENCOUNTER — Other Ambulatory Visit: Payer: Self-pay | Admitting: Family Medicine

## 2015-08-22 ENCOUNTER — Encounter: Payer: Self-pay | Admitting: Family Medicine

## 2015-08-22 ENCOUNTER — Ambulatory Visit (INDEPENDENT_AMBULATORY_CARE_PROVIDER_SITE_OTHER): Payer: Medicaid Other | Admitting: Family Medicine

## 2015-08-22 VITALS — BP 170/130 | HR 82 | Temp 98.1°F | Ht 63.0 in | Wt 253.9 lb

## 2015-08-22 DIAGNOSIS — N184 Chronic kidney disease, stage 4 (severe): Secondary | ICD-10-CM

## 2015-08-22 DIAGNOSIS — I10 Essential (primary) hypertension: Secondary | ICD-10-CM

## 2015-08-22 DIAGNOSIS — N186 End stage renal disease: Secondary | ICD-10-CM

## 2015-08-22 DIAGNOSIS — I5032 Chronic diastolic (congestive) heart failure: Secondary | ICD-10-CM

## 2015-08-22 DIAGNOSIS — N185 Chronic kidney disease, stage 5: Secondary | ICD-10-CM

## 2015-08-22 DIAGNOSIS — I422 Other hypertrophic cardiomyopathy: Secondary | ICD-10-CM

## 2015-08-22 DIAGNOSIS — I110 Hypertensive heart disease with heart failure: Secondary | ICD-10-CM

## 2015-08-22 MED ORDER — FUROSEMIDE 40 MG PO TABS: 40.0000 mg | ORAL_TABLET | Freq: Two times a day (BID) | ORAL | Status: DC

## 2015-08-22 MED ORDER — METOPROLOL TARTRATE 50 MG PO TABS: 50.0000 mg | ORAL_TABLET | Freq: Two times a day (BID) | ORAL | Status: DC

## 2015-08-22 MED ORDER — HYDRALAZINE HCL 100 MG PO TABS: 100.0000 mg | ORAL_TABLET | Freq: Two times a day (BID) | ORAL | Status: DC

## 2015-08-22 MED ORDER — AMLODIPINE BESYLATE 10 MG PO TABS: 10.0000 mg | ORAL_TABLET | Freq: Every day | ORAL | Status: DC

## 2015-08-22 NOTE — Progress Notes (Signed)
Subjective:    Patient ID: Brittney Contreras, female    DOB: 1976-01-05, 40 y.o.   MRN: [id number]  Brittney Contreras is a 40 y.o. female presenting on 08/22/2015 for meet new doctor  HPI  CHRONIC HTN: Reports - does not check BP at home, out of some medications x 1 month, needs refill, has difficulty paying for meds regularly - Additionally states prior BP back in 2004 up to "300/200", chart review with several SBP 200s Current Meds - metoprolol 50mg  BID and hydralazine 100mg  BID (does have this at home but not taking regularly, unclear why), amlodipine 10mg  and furosemide 40 BID (out of these) Reports good compliance, did not take meds today. Previously tolerating well, w/o complaints. Lifestyle - No regular exercise. No dietary changes Denies CP, dyspnea, HA, edema, dizziness / lightheadedness  CKD Stage IV-V / Diastolic CHF with HTN Cardiomyopathy: - Known chronic history for past 3 years with worsening CKD, likely due to uncontrolled HTN. She has been established with Dr Jimmy Footman at [facility], and has HD access R-arm AVF (placed in 2014), has not been used. Last apt with Renal on 07/26/15, next 10/2015. - Today she discusses her wishes to proceed with Hemodialysis as prior recommended by Renal. She describes that back 3 yr ago when diagnosed, she had initially "refused HD" and was "upset", over years she has reconsidered this, and understands that she will need to do this to prolong her life. She would like her chart to reflect her change of plans now to pursue HD. - Admits edema, not worsening, some reduced voiding but still daily multiple times - Denies anuria, dysuria, hematuria, abdominal pain, nausea, vomiting, confusion  Social history: Currently unemployed. Not on disability. Lives in [place] difficulty getting to clinic. Limited transportation. Her cousin lives locally. Medicaid insurance ran out recently, does not have [employer]  but interested in this if  medicaid does not work. Has case worker that can help with Medicaid  Social History  Substance Use Topics  . Smoking status: Former Smoker -- 0.30 packs/day for 20 years    Types: Cigarettes    Quit date: 08/16/2012  . Smokeless tobacco: Never Used     Comment: pack lasts 4-5 days  . Alcohol Use: No     Comment: occ    Review of Systems Per HPI unless specifically indicated above     Objective:    BP 170/130 mmHg  Pulse 82  Temp(Src) 98.1 F (36.7 C) (Oral)  Ht 5\' 3"  (1.6 m)  Wt 253 lb 14.4 oz (115.168 kg)  BMI 44.99 kg/m2  LMP 08/10/2015 (Exact Date)  Wt Readings from Last 3 Encounters:  08/22/15 253 lb 14.4 oz (115.168 kg)  10/05/14 248 lb 3.2 oz (112.583 kg)  06/16/14 256 lb 14.4 oz (116.529 kg)    Physical Exam  Constitutional: She is oriented to person, place, and time. She appears well-developed and well-nourished. No distress.  Morbidly obese, well-appearing, comfortable, cooperative  HENT:  Head: Normocephalic and atraumatic.  Mouth/Throat: Oropharynx is clear and moist.  Eyes: Conjunctivae and EOM are normal. Pupils are equal, round, and reactive to light. Right eye exhibits no discharge. Left eye exhibits no discharge.  Neck: Normal range of motion. Neck supple. No thyromegaly present.  Cardiovascular: Normal rate, regular rhythm, normal heart sounds and intact distal pulses.   No murmur heard. Pulmonary/Chest: Effort normal and breath sounds normal. No respiratory distress. She has no wheezes. She has no rales.  Musculoskeletal: She exhibits  no edema (bilateral lower ext non-pitting edema) or tenderness.  Neurological: She is alert and oriented to person, place, and time. No cranial nerve deficit.  Distal muscle str 5/5 grip and ankles, distal sensation to light touch intact. Gait normal  Skin: Skin is warm and dry. She is not diaphoretic.  Nursing note and vitals reviewed.  Results for orders placed or performed in visit on 10/05/14  Cytology - PAP [facility]  Result Value Ref Range   CYTOLOGY - PAP PAP RESULT       Assessment & Plan:   Problem List Items Addressed This Visit    CKD (chronic kidney disease) stage 4, GFR 15-29 ml/min (HCC)    Progressive worsening CKD with uncontrolled HTN. Followed by Dr Emilee Hero Patient now interested in starting HD, has R-AVF fistula since 2014. Advised contact/follow-up Renal to discuss plans to start HD Continue attempt control BP - refilled meds, concern cost/financial adherence      Relevant Medications   furosemide (LASIX) 40 MG tablet   Diastolic heart failure: Grade 3    Relevant Medications   furosemide (LASIX) 40 MG tablet   metoprolol (LOPRESSOR) 50 MG tablet   hydrALAZINE (APRESOLINE) 100 MG tablet   amLODipine (NORVASC) 10 MG tablet   End stage renal disease (HCC)    See A&P CKD, progressive, likely now end stage and may require HD has not started.      HTN (hypertension) - Primary (Chronic)    Uncontrolled, initial BP electronic >230/130. Repeat manual large cuff with 170/130, not on any meds today or x 1 month due to cost/no medicaid Today asymptomatic, no focal neuro deficits or symptoms of HTN emergency, likely compensated with chronic HTN uncontrolled Complication CKD > ESRD  Plan: 1. Refilled all anti-HTN meds - has metop and hydral at home, resume these, start amlodipine, gradually add back lasix per Renal 2. RTC 1-4 weeks BP re-check, but patient admits can't, check outside pharmacy BP log, return when can for BP, just had labs in 07/2015 per Renal, patient cant afford today deferred. Given handout on [facility] / orange card, if can't get medicaid resumed      Relevant Medications   furosemide (LASIX) 40 MG tablet   metoprolol (LOPRESSOR) 50 MG tablet   hydrALAZINE (APRESOLINE) 100 MG tablet   amLODipine (NORVASC) 10 MG tablet   Hypertensive hypertrophic cardiomyopathy (HCC) (Chronic)    Complication of uncontrolled HTN Refilled BP meds including  lasix      Relevant Medications   furosemide (LASIX) 40 MG tablet   metoprolol (LOPRESSOR) 50 MG tablet   hydrALAZINE (APRESOLINE) 100 MG tablet   amLODipine (NORVASC) 10 MG tablet    Other Visit Diagnoses    CKD (chronic kidney disease) stage 5, GFR less than 15 ml/min (HCC)  (Chronic)       Relevant Medications    furosemide (LASIX) 40 MG tablet       Meds ordered this encounter  Medications  . furosemide (LASIX) 40 MG tablet    Sig: Take 1 tablet (40 mg total) by mouth 2 (two) times daily.    Dispense:  60 tablet    Refill:  5  . metoprolol (LOPRESSOR) 50 MG tablet    Sig: Take 1 tablet (50 mg total) by mouth 2 (two) times daily.    Dispense:  60 tablet    Refill:  5  . hydrALAZINE (APRESOLINE) 100 MG tablet    Sig: Take 1 tablet (100 mg total) by  mouth 2 (two) times daily.    Dispense:  60 tablet    Refill:  5  . amLODipine (NORVASC) 10 MG tablet    Sig: Take 1 tablet (10 mg total) by mouth at bedtime.    Dispense:  30 tablet    Refill:  5      Follow up plan: Return in about 2 weeks (around 09/05/2015) for blood pressure.  Nobie Putnam, [place], PGY-3

## 2015-08-22 NOTE — Patient Instructions (Signed)
Thank you for coming in to clinic today.  1. Your BP is significantly elevated up to 170/130 - Sent refills on all 4 of your BP meds - Start with Amlodipine 10mg  daily, and take your other 2 that you have at home, Hydralazine and Metoprolol twice daily each - Discuss lasix with Dr Jimmy Footman - Check BP at [facility] and write down numbers if you can once you start taking your meds again  2. Please call Dr Jimmy Footman and discuss options with starting Hemodialysis if they would recommend it  3. Follow-up with Medicaid, if you can't get coverage then follow instructions on the hand out and schedule a [facility] Appointment here at our office  Please schedule a follow-up appointment with Dr Parks Ranger within 1 to 4 weeks for HTN / BP Re-check  If you have any other questions or concerns, please feel free to call the clinic to contact me. You may also schedule an earlier appointment if necessary.  However, if your symptoms get significantly worse, please go to the Emergency Department to seek immediate medical attention.  Brittney Contreras, [place]

## 2015-08-23 NOTE — Assessment & Plan Note (Addendum)
Progressive worsening CKD with uncontrolled HTN. Followed by Dr Emilee Hero Patient now interested in starting HD, has R-AVF fistula since 2014. Advised contact/follow-up Renal to discuss plans to start HD Continue attempt control BP - refilled meds, concern cost/financial adherence

## 2015-08-23 NOTE — Assessment & Plan Note (Signed)
Uncontrolled, initial BP electronic >230/130. Repeat manual large cuff with 170/130, not on any meds today or x 1 month due to cost/no medicaid Today asymptomatic, no focal neuro deficits or symptoms of HTN emergency, likely compensated with chronic HTN uncontrolled Complication CKD > ESRD  Plan: 1. Refilled all anti-HTN meds - has metop and hydral at home, resume these, start amlodipine, gradually add back lasix per Renal 2. RTC 1-4 weeks BP re-check, but patient admits can't, check outside pharmacy BP log, return when can for BP, just had labs in 07/2015 per Renal, patient cant afford today deferred. Given handout on [facility] / orange card, if can't get medicaid resumed

## 2015-08-23 NOTE — Assessment & Plan Note (Signed)
Complication of uncontrolled HTN Refilled BP meds including lasix

## 2015-08-23 NOTE — Assessment & Plan Note (Signed)
See A&P CKD, progressive, likely now end stage and may require HD has not started.

## 2015-09-19 ENCOUNTER — Emergency Department (HOSPITAL_COMMUNITY): Payer: Self-pay

## 2015-09-19 ENCOUNTER — Encounter (HOSPITAL_COMMUNITY): Payer: Self-pay | Admitting: Adult Health

## 2015-09-19 DIAGNOSIS — Z992 Dependence on renal dialysis: Secondary | ICD-10-CM | POA: Insufficient documentation

## 2015-09-19 DIAGNOSIS — J159 Unspecified bacterial pneumonia: Secondary | ICD-10-CM | POA: Insufficient documentation

## 2015-09-19 DIAGNOSIS — Z79899 Other long term (current) drug therapy: Secondary | ICD-10-CM | POA: Insufficient documentation

## 2015-09-19 DIAGNOSIS — Z3202 Encounter for pregnancy test, result negative: Secondary | ICD-10-CM | POA: Insufficient documentation

## 2015-09-19 DIAGNOSIS — Z8669 Personal history of other diseases of the nervous system and sense organs: Secondary | ICD-10-CM | POA: Insufficient documentation

## 2015-09-19 DIAGNOSIS — N186 End stage renal disease: Secondary | ICD-10-CM | POA: Insufficient documentation

## 2015-09-19 DIAGNOSIS — I132 Hypertensive heart and chronic kidney disease with heart failure and with stage 5 chronic kidney disease, or end stage renal disease: Secondary | ICD-10-CM | POA: Insufficient documentation

## 2015-09-19 DIAGNOSIS — Z87891 Personal history of nicotine dependence: Secondary | ICD-10-CM | POA: Insufficient documentation

## 2015-09-19 DIAGNOSIS — I5030 Unspecified diastolic (congestive) heart failure: Secondary | ICD-10-CM | POA: Insufficient documentation

## 2015-09-19 LAB — COMPREHENSIVE METABOLIC PANEL
ALBUMIN: 3.3 g/dL — AB (ref 3.5–5.0)
ALT: 7 U/L — ABNORMAL LOW (ref 14–54)
ANION GAP: 9 (ref 5–15)
AST: 18 U/L (ref 15–41)
Alkaline Phosphatase: 61 U/L (ref 38–126)
BILIRUBIN TOTAL: 0.9 mg/dL (ref 0.3–1.2)
BUN: 22 mg/dL — ABNORMAL HIGH (ref 6–20)
CHLORIDE: 111 mmol/L (ref 101–111)
CO2: 22 mmol/L (ref 22–32)
Calcium: 8.6 mg/dL — ABNORMAL LOW (ref 8.9–10.3)
Creatinine, Ser: 2.67 mg/dL — ABNORMAL HIGH (ref 0.44–1.00)
GFR calc Af Amer: 24 mL/min — ABNORMAL LOW (ref >60)
GFR calc non Af Amer: 21 mL/min — ABNORMAL LOW (ref >60)
GLUCOSE: 105 mg/dL — AB (ref 65–99)
POTASSIUM: 4.2 mmol/L (ref 3.5–5.1)
Sodium: 142 mmol/L (ref 135–145)
TOTAL PROTEIN: 7 g/dL (ref 6.5–8.1)

## 2015-09-19 LAB — CBC
HCT: 40 % (ref 36.0–46.0)
HEMOGLOBIN: 14.2 g/dL (ref 12.0–15.0)
MCH: 29 pg (ref 26.0–34.0)
MCHC: 35.5 g/dL (ref 30.0–36.0)
MCV: 81.8 fL (ref 78.0–100.0)
PLATELETS: 199 10*3/uL (ref 150–400)
RBC: 4.89 MIL/uL (ref 3.87–5.11)
RDW: 13.6 % (ref 11.5–15.5)
WBC: 10.4 10*3/uL (ref 4.0–10.5)

## 2015-09-19 LAB — I-STAT TROPONIN, ED: TROPONIN I, POC: 0.02 ng/mL (ref 0.00–0.08)

## 2015-09-19 NOTE — ED Notes (Signed)
Presents with left sided chest pain began Saturday night associated with SOB and difficulty laying sown to sleep also endorses nausea. Today she reports coughing up two bright red blood "Loogies"  Pt has 20% kidney function, not yet on dialysis. Chest pain comes and goes and is made worse by deep inspiration. Pain is described as 5/10 and sharp at times.

## 2015-09-20 ENCOUNTER — Emergency Department (HOSPITAL_COMMUNITY)
Admission: EM | Admit: 2015-09-20 | Discharge: 2015-09-20 | Disposition: A | Discharge status: Home / Self Care | Payer: Self-pay | Attending: Emergency Medicine | Admitting: Emergency Medicine

## 2015-09-20 ENCOUNTER — Encounter (HOSPITAL_COMMUNITY): Payer: Self-pay | Admitting: Radiology

## 2015-09-20 ENCOUNTER — Emergency Department (HOSPITAL_COMMUNITY): Payer: Self-pay

## 2015-09-20 DIAGNOSIS — J189 Pneumonia, unspecified organism: Secondary | ICD-10-CM

## 2015-09-20 LAB — TROPONIN I: Troponin I: 0.03 ng/mL (ref <0.031)

## 2015-09-20 LAB — I-STAT BETA HCG BLOOD, ED (MC, WL, AP ONLY)

## 2015-09-20 MED ORDER — AZITHROMYCIN 250 MG PO TABS
500.0000 mg | ORAL_TABLET | Freq: Once | ORAL | Status: AC
  Administered 2015-09-20: 500 mg via ORAL
  Filled 2015-09-20: qty 2

## 2015-09-20 MED ORDER — AMOXICILLIN 500 MG PO CAPS: 1000.0000 mg | ORAL_CAPSULE | Freq: Two times a day (BID) | ORAL | Status: DC

## 2015-09-20 MED ORDER — AZITHROMYCIN 250 MG PO TABS: 250.0000 mg | ORAL_TABLET | Freq: Every day | ORAL | Status: DC

## 2015-09-20 MED ORDER — AMOXICILLIN 500 MG PO CAPS
1000.0000 mg | ORAL_CAPSULE | Freq: Once | ORAL | Status: AC
  Administered 2015-09-20: 1000 mg via ORAL
  Filled 2015-09-20: qty 2

## 2015-09-20 NOTE — Discharge Instructions (Signed)

## 2015-09-20 NOTE — ED Notes (Signed)
Dr. Campos at bedside   

## 2015-09-20 NOTE — ED Provider Notes (Signed)
CSN: [id number]     Arrival date & time 09/19/15  2231 History  By signing my name below, I, Brittney Contreras, attest that this documentation has been prepared under the direction and in the presence of [name], MD. Electronically Signed: Helane Contreras, ED Scribe. 09/20/2015. 3:24 AM.    Chief Complaint  Patient presents with  . Hemoptysis  . Chest Pain   The history is provided by the patient. No language interpreter was used.   HPI Comments: Brittney Contreras is a 40 y.o. female former smoker with a PMHx of diastolic heart failure, malignant HTN with congestive heart failure, hypertensive, hypertrophic cardiomyopathy, CKD, and tobacco abuse, as well as a PSHx of AV fistula placement and ligation of competing branches of arteriovenous fistula who presents to the Emergency Department complaining of one episode of hemoptysis (2 bright red "loogies") onset today, and intermittent, sharp, left-sided chest pain onset 2 nights ago. Pt states the chest pain began under her left breast, and has now begun to radiate up through her left neck. She reports each episode can last for up to an hour. She notes exacerbation of the chest pain with lying supine. She reports associated nausea, as well as dyspnea on standing up and with speaking for a long time. She notes she is in her last stages of kidney failure due to HTN, but has not yet been started on dialysis. She states she is not on blood thinners and denies a PMHx of DVT. She reports being checked for a DVT 3 years ago due to increased lower leg swelling. She notes she does not currently have a cardiologist, but used to in the past. Pt denies vomiting, chills, fever, and any new edema.   Past Medical History  Diagnosis Date  . Hypertension   . Hypertensive hypertrophic cardiomyopathy ([place]) 07/28/2012  . Diastolic heart failure: Grade 3  07/25/2012  . Obesity, morbid ([place]) 07/25/2012  . Malignant hypertension with congestive heart failure ([place]) 07/25/2012  .  Tobacco abuse 07/27/2012  . Chronic kidney disease   . Obstructive sleep apnea    Past Surgical History  Procedure Laterality Date  . Av fistula placement Right 08/01/2012    Procedure: ARTERIOVENOUS (AV) FISTULA CREATION;  Surgeon: Mal Misty, MD;  Location: [place];  Service: Vascular;  Laterality: Right;  Ultrasound guided; Attempted wrist AV Fistula Creation.  . Ligation of competing branches of arteriovenous fistula Right 11/05/2012    Procedure: LIGATION OF COMPETING BRANCHES OF ARTERIOVENOUS FISTULA;  Surgeon: Mal Misty, MD;  Location: [place];  Service: Vascular;  Laterality: Right;  . Av fistula placement Right 07/2012  . Shuntogram Right 12/30/2012    Procedure: FISTULOGRAM;  Surgeon: Serafina Mitchell, MD;  Location: [facility] CATH LAB;  Service: Cardiovascular;  Laterality: Right;   Family History  Problem Relation Age of Onset  . Kidney disease      Aunt, deceased, had been on dialysis  . Hypertension Mother    Social History  Substance Use Topics  . Smoking status: Former Smoker -- 0.30 packs/day for 20 years    Types: Cigarettes    Quit date: 08/16/2012  . Smokeless tobacco: Never Used     Comment: pack lasts 4-5 days  . Alcohol Use: No     Comment: occ   OB History    No data available     Review of Systems A complete 10 system review of systems was obtained and all systems are negative except as noted in the  HPI and PMH.   Allergies  Review of patient's allergies indicates no known allergies.  Home Medications   Prior to Admission medications   Medication Sig Start Date End Date Taking? Authorizing Provider  amLODipine (NORVASC) 10 MG tablet Take 1 tablet (10 mg total) by mouth at bedtime. 08/22/15   Olin Hauser, DO  calcitRIOL (ROCALTROL) 0.25 MCG capsule Take 1 capsule (0.25 mcg total) by mouth daily. 06/22/14   Timmothy Euler, MD  furosemide (LASIX) 40 MG tablet Take 1 tablet (40 mg total) by mouth 2 (two) times daily. 08/22/15   Olin Hauser, DO  hydrALAZINE (APRESOLINE) 100 MG tablet Take 1 tablet (100 mg total) by mouth 2 (two) times daily. 08/22/15   Olin Hauser, DO  metoprolol (LOPRESSOR) 50 MG tablet Take 1 tablet (50 mg total) by mouth 2 (two) times daily. 08/22/15   Olin Hauser, DO  sodium bicarbonate 650 MG tablet Take 1 tablet (650 mg total) by mouth 2 (two) times daily. 04/08/13   Timmothy Euler, MD   BP 198/130 mmHg  Pulse 101  Temp(Src) 99.1 F (37.3 C) (Oral)  Resp 24  Ht 5\' 3"  (1.6 m)  Wt 254 lb (115.214 kg)  BMI 45.01 kg/m2  SpO2 98%  LMP 09/05/2015 (Exact Date) Physical Exam  Constitutional: She is oriented to person, place, and time. She appears well-developed and well-nourished. No distress.  HENT:  Head: Normocephalic and atraumatic.  Eyes: EOM are normal.  Neck: Normal range of motion.  Cardiovascular: Normal rate, regular rhythm and normal heart sounds.   Pulmonary/Chest: Effort normal and breath sounds normal.  Abdominal: Soft. She exhibits no distension. There is no tenderness.  Musculoskeletal: Normal range of motion. She exhibits no edema.  Fistula RUE with good thrill  Neurological: She is alert and oriented to person, place, and time.  Skin: Skin is warm and dry.  Psychiatric: She has a normal mood and affect. Judgment normal.  Nursing note and vitals reviewed.   ED Course  Procedures  DIAGNOSTIC STUDIES: Oxygen Saturation is 98% on RA, normal by my interpretation.    COORDINATION OF CARE: 3:23 AM - Discussed plans to order a CT scan to r/o PNA, as well as repeat diagnostic studies. Pt advised of plan for treatment and pt agrees.  Labs Review Labs Reviewed  COMPREHENSIVE METABOLIC PANEL - Abnormal; Notable for the following:    Glucose, Bld 105 (*)    BUN 22 (*)    Creatinine, Ser 2.67 (*)    Calcium 8.6 (*)    Albumin 3.3 (*)    ALT 7 (*)    GFR calc non Af Amer 21 (*)    GFR calc Af Amer 24 (*)    All other components within normal limits   CBC  TROPONIN I  I-STAT TROPOININ, ED  I-STAT BETA HCG BLOOD, ED ([facility], [facility], [facility] ONLY)    Imaging Review Dg Chest 2 View  09/19/2015  CLINICAL DATA:  Left-sided chest pain since Saturday. EXAM: CHEST  2 VIEW COMPARISON:  07/25/2012 FINDINGS: Blunting of the left costophrenic angle suggesting small effusion. Possible small focal area of atelectasis or consolidation in the left lung base. Changes may indicate early pneumonia. Right lung is clear and expanded. Normal heart size and pulmonary vascularity. Tortuous aorta. No pneumothorax. IMPRESSION: Small left pleural effusion with infiltration or atelectasis in the left lung base. Changes may indicate early pneumonia. Electronically Signed   By: Lucienne Capers M.D.   On: 09/19/2015  23:45   I have personally reviewed and evaluated these images and lab results as part of my medical decision-making.   EKG Interpretation None      MDM   Final diagnoses:  None    Patiently treated for community acquired pneumonia.  Low-grade temp and new infiltrate.  She's had productive cough.  Likely bronchial irritation as a cause of her mild hemoptysis.  This is not frank hemoptysis.  Discharge home on antibiotics.  Primary care follow-up.  She understands to return to the ER for new or worsening symptoms.  No hypoxia.  I personally performed the services described in this documentation, which was scribed in my presence. The recorded information has been reviewed and is accurate.       [name], MD 09/21/15 [phone]

## 2016-03-06 ENCOUNTER — Encounter: Payer: Self-pay | Admitting: Family Medicine

## 2016-03-06 ENCOUNTER — Ambulatory Visit: Payer: Self-pay | Attending: Internal Medicine

## 2016-05-03 ENCOUNTER — Encounter: Payer: Self-pay | Admitting: Family Medicine

## 2016-05-03 ENCOUNTER — Ambulatory Visit (INDEPENDENT_AMBULATORY_CARE_PROVIDER_SITE_OTHER): Payer: Self-pay | Admitting: Family Medicine

## 2016-05-03 VITALS — BP 220/160 | HR 97 | Temp 98.3°F | Ht 64.2 in | Wt 271.4 lb

## 2016-05-03 DIAGNOSIS — Z23 Encounter for immunization: Secondary | ICD-10-CM

## 2016-05-03 DIAGNOSIS — I110 Hypertensive heart disease with heart failure: Secondary | ICD-10-CM

## 2016-05-03 DIAGNOSIS — N184 Chronic kidney disease, stage 4 (severe): Secondary | ICD-10-CM

## 2016-05-03 DIAGNOSIS — I422 Other hypertrophic cardiomyopathy: Secondary | ICD-10-CM

## 2016-05-03 DIAGNOSIS — N185 Chronic kidney disease, stage 5: Secondary | ICD-10-CM

## 2016-05-03 DIAGNOSIS — I10 Essential (primary) hypertension: Secondary | ICD-10-CM

## 2016-05-03 DIAGNOSIS — Z01419 Encounter for gynecological examination (general) (routine) without abnormal findings: Secondary | ICD-10-CM

## 2016-05-03 LAB — CBC WITH DIFFERENTIAL/PLATELET
BASOS PCT: 0 %
Basophils Absolute: 0 cells/uL (ref 0–200)
EOS ABS: 230 {cells}/uL (ref 15–500)
Eosinophils Relative: 2 %
HEMATOCRIT: 43.4 % (ref 35.0–45.0)
Hemoglobin: 14.5 g/dL (ref 11.7–15.5)
LYMPHS PCT: 12 %
Lymphs Abs: 1380 cells/uL (ref 850–3900)
MCH: 29.2 pg (ref 27.0–33.0)
MCHC: 33.4 g/dL (ref 32.0–36.0)
MCV: 87.3 fL (ref 80.0–100.0)
MONO ABS: 920 {cells}/uL (ref 200–950)
MPV: 10.9 fL (ref 7.5–12.5)
Monocytes Relative: 8 %
NEUTROS PCT: 78 %
Neutro Abs: 8970 cells/uL — ABNORMAL HIGH (ref 1500–7800)
Platelets: 180 10*3/uL (ref 140–400)
RBC: 4.97 MIL/uL (ref 3.80–5.10)
RDW: 15.9 % — AB (ref 11.0–15.0)
WBC: 11.5 10*3/uL — ABNORMAL HIGH (ref 3.8–10.8)

## 2016-05-03 LAB — LIPID PANEL
Cholesterol: 197 mg/dL (ref <200)
HDL: 56 mg/dL (ref >50)
LDL CALC: 127 mg/dL — AB (ref <100)
Total CHOL/HDL Ratio: 3.5 Ratio (ref <5.0)
Triglycerides: 69 mg/dL (ref <150)
VLDL: 14 mg/dL (ref <30)

## 2016-05-03 LAB — COMPLETE METABOLIC PANEL WITH GFR
ALT: 6 U/L (ref 6–29)
AST: 15 U/L (ref 10–30)
Albumin: 3.6 g/dL (ref 3.6–5.1)
Alkaline Phosphatase: 61 U/L (ref 33–115)
BUN: 34 mg/dL — AB (ref 7–25)
CHLORIDE: 110 mmol/L (ref 98–110)
CO2: 23 mmol/L (ref 20–31)
CREATININE: 3.18 mg/dL — AB (ref 0.50–1.10)
Calcium: 8.7 mg/dL (ref 8.6–10.2)
GFR, Est African American: 20 mL/min — ABNORMAL LOW (ref >=60)
GFR, Est Non African American: 17 mL/min — ABNORMAL LOW (ref >=60)
Glucose, Bld: 73 mg/dL (ref 65–99)
Potassium: 4.3 mmol/L (ref 3.5–5.3)
Sodium: 140 mmol/L (ref 135–146)
Total Bilirubin: 0.7 mg/dL (ref 0.2–1.2)
Total Protein: 6.8 g/dL (ref 6.1–8.1)

## 2016-05-03 MED ORDER — AMLODIPINE BESYLATE 10 MG PO TABS: 10.0000 mg | ORAL_TABLET | Freq: Every day | ORAL | 1 refills | Status: DC

## 2016-05-03 MED ORDER — SODIUM BICARBONATE 650 MG PO TABS: 650.0000 mg | ORAL_TABLET | Freq: Two times a day (BID) | ORAL | 1 refills | Status: DC

## 2016-05-03 MED ORDER — HYDRALAZINE HCL 100 MG PO TABS: 100.0000 mg | ORAL_TABLET | Freq: Two times a day (BID) | ORAL | 1 refills | Status: DC

## 2016-05-03 MED ORDER — METOPROLOL TARTRATE 50 MG PO TABS: 50.0000 mg | ORAL_TABLET | Freq: Two times a day (BID) | ORAL | 1 refills | Status: DC

## 2016-05-03 MED ORDER — CALCITRIOL 0.25 MCG PO CAPS: 0.2500 ug | ORAL_CAPSULE | Freq: Every day | ORAL | 1 refills | Status: DC

## 2016-05-03 MED ORDER — FUROSEMIDE 40 MG PO TABS: 40.0000 mg | ORAL_TABLET | Freq: Two times a day (BID) | ORAL | 1 refills | Status: DC

## 2016-05-03 NOTE — Patient Instructions (Signed)
Was nice to meet you Please check your blood pressure at [facility] at least once a week. I'll check labs, I will call you if the results are significantly different from those in April, otherwise will receive a letter in the mail.  For your medications, the Lasix and the metoprolol are the most important. The amlodipine and then the hydralazine are the most important after that.   Calcitrol and the sodium bicarbonate are medications that are typically prescribed by the kidney doctor. These are important, however I would not be what to tell you how important until we get your lab work back.  Follow-up with the nurses appointment in 1 week for blood pressure check. Follow-up with me in one month to evaluate your blood pressure.  Things to do to Keep yourself Healthy  - Exercise at least 30-45 minutes a day,  3-4 days a week.  - Eat a low-fat diet with lots of fruits and vegetables, up to 7-9 servings per day. - Seatbelts can save your life. Wear them always. - Smoke detectors on every level of your home, check batteries every year. - Eye Doctor - have an eye exam every 1-2 years - Safe sex - if you may be exposed to STDs, use a condom. - Alcohol If you drink, do it moderately,less than 2 drinks per dayt. - [place].  Choose someone to speak for you if you are not able. - Depression is common in our stressful world.If you're feeling down or losing interest in things you normally enjoy, please come in for a visit.

## 2016-05-03 NOTE — Assessment & Plan Note (Signed)
The patient is presenting with severely uncontrolled blood pressure. It is difficult to know whether this is secondary to noncompliance versus not having her on appropriate regimen as it has been greater than 4 months and she is taking any of her medications. She denies any symptoms of malignant hypertension such as vision changes headaches or chest pain. She's no focal neuro deficits on exam. I suspect that this is her baseline. Looking back in the EMR, she's had blood pressures similar to this in the past. We did discuss medication in the clinic versus going to the emergency room. The patient declined both of these and understands that she is at risk for things like stroke and even death.  -Refilled all her antihypertensive medications. Discussed the way she should restart them according to importance: Metoprolol and Lasix first, followed by amlodipine and hydralazine.  -Discussed obtaining a CMET today as well as a CBC to look for end organ damage. She is aware that her renal function is probably worsened given her poor medication compliance. -Patient to follow-up in 1 week for a nurses blood pressure check (she states she cannot get here sooner). -Patient to check her blood pressure at least weekly at [facility]. -The patient's to follow-up with me within the next 2-4 weeks -Discussed reasons to return to the clinic versus going to the emergency room.

## 2016-05-03 NOTE — Assessment & Plan Note (Signed)
Abolition of uncontrolled hypertension. She appears to have trace to 1+ pitting edema. She does not like prominently volume overloaded. -Refill blood pressure medications in addition to Lasix.

## 2016-05-03 NOTE — Assessment & Plan Note (Signed)
Pap smear up-to-date. Discussed management colonoscopy with the patient. She will wait to the age of 44 for both.  She received the flu vaccine and TDap vaccine today. We discussed exercise and healthy diet.

## 2016-05-03 NOTE — Assessment & Plan Note (Signed)
BMET today. I suspect this has worsened. Advised pt to f/u with nephrologist.

## 2016-05-03 NOTE — Progress Notes (Signed)
40 y.o. year old female presents for well woman/preventative visit.   Acute Concerns:  HTN, uncontrolled:  Reports - does not check BP at home, has been out of her medications since April, notes her Medicaid ran out in Feb 2017. Tells the CMA and confirms with me that she knows her BP is very high today but is not interested in getting medication in the clinic or being evaluated in the ED due to it. Currently prescribed - metoprolol 50mg  BID and hydralazine 100mg  BID, amlodipine 10mg  and furosemide 40 BID  Lifestyle - No regular exercise. 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 - Has HD access R-arm AVF (placed in 2014), has not been used. Last apt with [facility] on 11/2015, at that visit they noted non-compliance with all medications and a BP of 164/112. -  Admits to LE edema for the last 3-4 months as well as DOE.  - no SOB at rest. Maybe has some difficulty with sleeping at night but denies PND.  - Denies anuria, dysuria, hematuria, abdominal pain, nausea, vomiting, confusion  Social:  Social History   Social History  . Marital status: Single    Spouse name: N/A  . Number of children: N/A  . Years of education: N/A   Social History Main Topics  . Smoking status: Former Smoker    Packs/day: 0.30    Years: 20.00    Types: Cigarettes    Quit date: 08/16/2012  . Smokeless tobacco: Never Used     Comment: pack lasts 4-5 days  . Alcohol use No     Comment: occ  . Drug use: No     Comment: No hx of IV Use  . Sexual activity: Not Asked   Other Topics Concern  . 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 Influenza,inj,Quad PF,36+ Mos 05/03/2016  . Tdap 05/03/2016    Cancer Screening:  Pap Smear: 10/05/14- negative IEL and HPV  Mammogram: never had, no family h/o breast cancer  Colonoscopy: grandfather had colon cancer (diagnosed ~42y/o)    Physical Exam: VITALS: Reviewed Blood pressure (!) 220/160, pulse 97, temperature 98.3 F (36.8 C), temperature source Oral, height 5' 4.2" (1.631 m), weight 271 lb 6.4 oz (123.1 kg), last menstrual period 04/07/2016.  GEN: Pleasant female, NAD HEENT: Normocephalic, PERRL, EOMI, no scleral icterus, bilateral TM pearly grey, nasal septum midline, MMM, uvula midline, no anterior or posterior lymphadenopathy, no thyromegaly. Fundoscopic exam with what appears to be AV nicking bilaterally.  CARDIAC:RRR, S1 and S2 present, II/VI holosystolic murmur noted,  no heaves/thrills. No JVD noted.  RESP: CTAB, normal effort ABD: soft, no tenderness, normal bowel sounds EXT: Trace/1+ pitting edema bilaterally. Very large, torturous AVF noted in RUE.  SKIN: no rash  ASSESSMENT & PLAN: 40 y.o. female presents for annual well woman/preventative exam.  Please see problem specific assessment and plan.   Uncontrolled hypertension The patient is presenting with severely uncontrolled blood pressure. It is difficult to know whether this is secondary to noncompliance versus not having her on appropriate regimen as it has been greater than 4  months and she is taking any of her medications. She denies any symptoms of malignant hypertension such as vision changes headaches or chest pain. She's no focal neuro deficits on exam. I suspect that this is her baseline. Looking back in the EMR, she's had blood pressures similar to this in the past. We did discuss medication in the clinic versus going to the emergency room. The patient declined both of these and understands that she is at risk for things like stroke and even death.  -Refilled all her  antihypertensive medications. Discussed the way she should restart them according to importance: Metoprolol and Lasix first, followed by amlodipine and hydralazine.  -Discussed obtaining a CMET today as well as a CBC to look for end organ damage. She is aware that her renal function is probably worsened given her poor medication compliance. -Patient to follow-up in 1 week for a nurses blood pressure check (she states she cannot get here sooner). -Patient to check her blood pressure at least weekly at [facility]. -The patient's to follow-up with me within the next 2-4 weeks -Discussed reasons to return to the clinic versus going to the emergency room.  Hypertensive hypertrophic cardiomyopathy Abolition of uncontrolled hypertension. She appears to have trace to 1+ pitting edema. She does not like prominently volume overloaded. -Refill blood pressure medications in addition to Lasix.  Well woman exam Pap smear up-to-date. Discussed management colonoscopy with the patient. She will wait to the age of 68 for both.  She received the flu vaccine and TDap vaccine today. We discussed exercise and healthy diet.  CKD (chronic kidney disease) stage 4, GFR 15-29 ml/min (HCC) BMET today. I suspect this has worsened. Advised pt to f/u with nephrologist.

## 2016-05-07 ENCOUNTER — Telehealth: Payer: Self-pay | Admitting: Family Medicine

## 2016-05-07 MED ORDER — ATORVASTATIN CALCIUM 40 MG PO TABS: 40.0000 mg | ORAL_TABLET | Freq: Every day | ORAL | 3 refills | Status: DC

## 2016-05-07 NOTE — Telephone Encounter (Signed)
Called patient to discuss recent lab results. Creatinine worsened, most likely from uncontrolled HTN. Additionally, ASCVD score 16% based on recent lipid panel. Sent Lipitor to her pharmacy, [facility].   Archie Patten, MD [facility] Family Medicine Resident  05/07/2016, 1:53 PM\

## 2016-05-16 ENCOUNTER — Telehealth: Payer: Self-pay | Admitting: Family Medicine

## 2016-05-16 NOTE — Telephone Encounter (Signed)
LMOVM for pt to call us back and make an appt. Deseree Kennon Holter, CMA

## 2016-05-16 NOTE — Telephone Encounter (Signed)
I spoke with the patient last week about following up with me, I do not see her on my schedule. Will you please contact her an have her make an appt for a f/u on her BP.  Thanks, Archie Patten, MD [facility] Family Medicine Resident  05/16/2016, 9:53 AM

## 2016-05-17 NOTE — Telephone Encounter (Signed)
Pt called back and I scheduled her for an appt. Deseree Kennon Holter, CMA

## 2016-05-25 ENCOUNTER — Ambulatory Visit: Payer: Self-pay | Admitting: Family Medicine

## 2016-06-06 ENCOUNTER — Ambulatory Visit: Payer: Self-pay | Admitting: Family Medicine

## 2016-08-02 ENCOUNTER — Other Ambulatory Visit (HOSPITAL_COMMUNITY)
Admission: RE | Admit: 2016-08-02 | Discharge: 2016-08-02 | Disposition: A | Discharge status: Home / Self Care | Payer: Self-pay | Source: Ambulatory Visit | Attending: Nephrology | Admitting: Nephrology

## 2016-08-02 MED FILL — ?FUROSEMIDE 40 MG TABLET: 40 | 30 days supply | Qty: 60 | Fill #0

## 2016-08-02 MED FILL — hydrALAZINE HCL 100 MG TABS: 100 | 30 days supply | Qty: 60 | Fill #0

## 2016-08-02 MED FILL — AMLODIPINE BESYLATE 10 MG T: 10 | 30 days supply | Qty: 30 | Fill #0

## 2016-08-02 MED FILL — METOPROLOL TARTRATE 50 MG T: 50 | 30 days supply | Qty: 60 | Fill #0

## 2016-08-02 MED FILL — SODIUM BICARB 10 GRAIN TAB: 650 | 30 days supply | Qty: 60 | Fill #0

## 2016-08-02 MED FILL — ATORVASTATIN 40 MG TABLET: 40 | 30 days supply | Qty: 30 | Fill #0

## 2016-09-11 ENCOUNTER — Ambulatory Visit (HOSPITAL_COMMUNITY)
Admission: RE | Admit: 2016-09-11 | Discharge: 2016-09-11 | Disposition: A | Discharge status: Home / Self Care | Payer: Self-pay | Source: Ambulatory Visit | Attending: Family Medicine | Admitting: Family Medicine

## 2016-09-11 ENCOUNTER — Ambulatory Visit (INDEPENDENT_AMBULATORY_CARE_PROVIDER_SITE_OTHER): Payer: Self-pay | Admitting: Family Medicine

## 2016-09-11 VITALS — BP 148/92 | HR 77 | Temp 97.3°F | Wt 273.8 lb

## 2016-09-11 DIAGNOSIS — N184 Chronic kidney disease, stage 4 (severe): Secondary | ICD-10-CM

## 2016-09-11 DIAGNOSIS — I110 Hypertensive heart disease with heart failure: Secondary | ICD-10-CM

## 2016-09-11 DIAGNOSIS — R079 Chest pain, unspecified: Secondary | ICD-10-CM

## 2016-09-11 DIAGNOSIS — I10 Essential (primary) hypertension: Secondary | ICD-10-CM

## 2016-09-11 DIAGNOSIS — R0789 Other chest pain: Secondary | ICD-10-CM

## 2016-09-11 DIAGNOSIS — Z1159 Encounter for screening for other viral diseases: Secondary | ICD-10-CM

## 2016-09-11 DIAGNOSIS — I4581 Long QT syndrome: Secondary | ICD-10-CM | POA: Insufficient documentation

## 2016-09-11 DIAGNOSIS — I422 Other hypertrophic cardiomyopathy: Secondary | ICD-10-CM

## 2016-09-11 NOTE — Assessment & Plan Note (Addendum)
Euvolemic on exam.  - continue with current regimen. - once BP under control, will need to transition to Toprol-XL

## 2016-09-11 NOTE — Assessment & Plan Note (Signed)
Present on exertion, improved with rest. Possibly deconditioning, however she has significant risk factors for CAD. EKG re-assuring and no chest pain recently.  - referral to cardiology, may benefit from stress test. - discussed reasons to seek care immediately such as change in chest pain, it does not resolve during rest or it starts during rest, or new associated symptoms.

## 2016-09-11 NOTE — Assessment & Plan Note (Signed)
BP improved from previous, but still not at goal given CKD and hypertrophic cardiomyopathy. - continue with current regimen for now, pt will f/u in 4 weeks and we may increase hydralazine frequency.  - BMET today.

## 2016-09-11 NOTE — Assessment & Plan Note (Addendum)
Following up with [facility]. Graft in place. As of 08/02/16, no significant change in function which is very fortunate given her uncontrolled HTN.  - per renal note, she received pneumococcal vaccine 06/2013 - BMET today - CBC to evaluate for anemia of chronic kidney disease.

## 2016-09-11 NOTE — Progress Notes (Signed)
Subjective: YB:WLSLHTDS HPI: Patient is a 41 y.o. female with a past medical history of uncontrolled HTN, hypertropic cardiomyopathy, CKD stage 4 presenting to clinic today for a referral to a heart doctor.   She was last seen in 04/2016 and had significantly elevated BP. At that time she was advised to f/u in 2 weeks but never did. She states this was b/c she couldn't afford her medications and didn't want to disappoint.   Hypertension Just got BP medications on 08/02/16 after seeing the nephrologist.  Has fistula in place  Meds: Compliant with lasix, amlodipine, hydralazine, metoprolol since that time.  Side effects: has to get up to urinate every 2 hours at night. She states she takes lasix during the day, unsure when she takes the other medications.  ROS: Denies headache, dizziness, visual changes, nausea, vomiting, chest pain, abdominal pain.   Cardiology referral : She wants disability and her attorney wanted to know why she doesn't have a heart doctor given her diagnosis of heart failure. She has never seen a cardiologist. From reviewing EMR, looks like she may have had grade 3 diastolic function in the past, most recent echo from 2016 revealed severe concentric hypertrophy, EF 60-65%, and grade 1 diastolic dysfunction. Patient notes intermittent chest pain with ambulating at times that resolves with rest. The last time this occurred was 2 months ago. It was left sided without radiation. Pain feels like "a punch" but not a "hard blow."  She is SOB with this, however is SOB at baseline now with ambulation, even from the parking lot to the exam room. She denies diaphoresis. No jaw claudication or L arm pain.  Social History: former smoker   ROS: All other systems reviewed and are negative.  Past Medical History Patient Active Problem List   Diagnosis Date Noted  . Chest pain 09/11/2016  . Well woman exam 10/05/2014  . Systolic murmur 28/76/8115  . Unspecified hypothyroidism  04/08/2013  . Noncompliance 04/08/2013  . Secondary hyperparathyroidism ([place]) 03/23/2013  . End stage renal disease ([place]) 08/26/2012  . CKD (chronic kidney disease) stage 4, GFR 15-29 ml/min (HCC) 07/30/2012  . Hypertensive hypertrophic cardiomyopathy ([place]) 07/28/2012  . Tobacco abuse 07/27/2012  . Diastolic heart failure: Grade 3  07/25/2012  . Uncontrolled hypertension 07/25/2012  . Obesity, morbid ([place]) 07/25/2012  . Malignant hypertension with congestive heart failure ([place]) 07/25/2012    Medications- reviewed and updated Current Outpatient Prescriptions  Medication Sig Dispense Refill  . amLODipine (NORVASC) 10 MG tablet Take 1 tablet (10 mg total) by mouth at bedtime. 30 tablet 1  . atorvastatin (LIPITOR) 40 MG tablet Take 1 tablet (40 mg total) by mouth daily. 30 tablet 3  . calcitRIOL (ROCALTROL) 0.25 MCG capsule Take 1 capsule (0.25 mcg total) by mouth daily. 30 capsule 1  . furosemide (LASIX) 40 MG tablet Take 1 tablet (40 mg total) by mouth 2 (two) times daily. 60 tablet 1  . hydrALAZINE (APRESOLINE) 100 MG tablet Take 1 tablet (100 mg total) by mouth 2 (two) times daily. 60 tablet 1  . metoprolol (LOPRESSOR) 50 MG tablet Take 1 tablet (50 mg total) by mouth 2 (two) times daily. 60 tablet 1  . sodium bicarbonate 650 MG tablet Take 1 tablet (650 mg total) by mouth 2 (two) times daily. 60 tablet 1   No current facility-administered medications for this visit.     Objective: Office vital signs reviewed. BP (!) 148/92   Pulse 77   Temp 97.3 F (36.3 C) (  Oral)   Wt 273 lb 12.8 oz (124.2 kg)   LMP 08/02/2016 (Exact Date)   SpO2 98%   BMI 46.71 kg/m    Physical Examination:  General: Awake, alert, well- nourished, NAD Neck: No JVD.  Cardio: RRR, no m/r/g noted. No thrill.  Pulm: No increased WOB.  CTAB, without wheezes, rhonchi or crackles noted.  Extremities: Trace pitting edema in the LEs bilaterally. Large tortuous graft in R UE with thrill.   EKG: NSR, HR 65. LVH.  T wave inversion in I, II, V4-V6. No ST elevation or depression.  Stable from previous EKGs. QTc 465  Assessment/Plan: Uncontrolled hypertension BP improved from previous, but still not at goal given CKD and hypertrophic cardiomyopathy. - continue with current regimen for now, pt will f/u in 4 weeks and we may increase hydralazine frequency.  - BMET today.   Hypertensive hypertrophic cardiomyopathy Euvolemic on exam.  - continue with current regimen. - once BP under control, will need to transition to Toprol-XL  CKD (chronic kidney disease) stage 4, GFR 15-29 ml/min (HCC) [facility] up with [facility]. Graft in place. As of 08/02/16, no significant change in function which is very fortunate given her uncontrolled HTN.  - per renal note, she received pneumococcal vaccine 06/2013 - BMET today - CBC to evaluate for anemia of chronic kidney disease.   Chest pain Present on exertion, improved with rest. Possibly deconditioning, however she has significant risk factors for CAD. EKG re-assuring and no chest pain recently.  - referral to cardiology, may benefit from stress test. - discussed reasons to seek care immediately such as change in chest pain, it does not resolve during rest or it starts during rest, or new associated symptoms.   Health maintence:  Will get HIV screening today.  Orders Placed This Encounter  Procedures  . Basic metabolic panel  . Lipid panel  . CBC with Differential/Platelet  . HIV antibody  . Ambulatory referral to Cardiology    Referral Priority:   Routine    Referral Type:   Consultation    Referral Reason:   Specialty Services Required    Requested Specialty:   Cardiology    Number of Visits Requested:   1  . EKG 12-Lead  . EKG 12-Lead    Ordered by an unspecified provider     No orders of the defined types were placed in this encounter.   Archie Patten PGY-3, [place]

## 2016-09-11 NOTE — Patient Instructions (Signed)
Your blood pressure looks better, but is still not at goal  Your EKG looks stable from previous years. Your heart does look a little larger due to the uncontrolled high blood pressure. It is important to get your BP under control to slow the damage to your heart and kidneys.  If you start having chest pain that does not resolve with rest, is different from your previous pains, or has new associated symptoms please seek care immediately.  Follow up with [place] in 1 month or sooner as needed.   Nonspecific Chest Pain Chest pain can be caused by many different conditions. There is a chance that your pain could be related to something serious, such as a heart attack or a blood clot in your lungs. Chest pain can also be caused by conditions that are not life-threatening. If you have chest pain, it is very important to follow up with your doctor. Follow these instructions at home: Medicines   If you were prescribed an antibiotic medicine, take it as told by your doctor. Do not stop taking the antibiotic even if you start to feel better.  Take over-the-counter and prescription medicines only as told by your doctor. Lifestyle   Do not use any products that contain nicotine or tobacco, such as cigarettes and e-cigarettes. If you need help quitting, ask your doctor.  Do not drink alcohol.  Make lifestyle changes as told by your doctor. These may include:  Getting regular exercise. Ask your doctor for some activities that are safe for you.  Eating a heart-healthy diet. A diet specialist (dietitian) can help you to learn healthy eating options.  Staying at a healthy weight.  Managing diabetes, if needed.  Lowering your stress, as with deep breathing or spending time in nature. General instructions   Avoid any activities that make you feel chest pain.  If your chest pain is because of heartburn:  Raise (elevate) the head of your bed about 6 inches (15 cm). You can do this by putting blocks  under the bed legs at the head of the bed.  Do not sleep with extra pillows under your head. That does not help heartburn.  Keep all follow-up visits as told by your doctor. This is important. This includes any further testing if your chest pain does not go away. Contact a doctor if:  Your chest pain does not go away.  You have a rash with blisters on your chest.  You have a fever.  You have chills. Get help right away if:  Your chest pain is worse.  You have a cough that gets worse, or you cough up blood.  You have very bad (severe) pain in your belly (abdomen).  You are very weak.  You pass out (faint).  You have either of these for no clear reason:  Sudden chest discomfort.  Sudden discomfort in your arms, back, neck, or jaw.  You have shortness of breath at any time.  You suddenly start to sweat, or your skin gets clammy.  You feel sick to your stomach (nauseous).  You throw up (vomit).  You suddenly feel light-headed or dizzy.  Your heart starts to beat fast, or it feels like it is skipping beats. These symptoms may be an emergency. Do not wait to see if the symptoms will go away. Get medical help right away. Call your local emergency services (911 in the [place] Do not drive yourself to the hospital. This information is not intended to replace advice given to  you by your health care provider. Make sure you discuss any questions you have with your health care provider. Document Released: 11/07/2007 Document Revised: 02/13/2016 Document Reviewed: 02/13/2016 Elsevier Interactive Patient Education  2017 [employer].

## 2016-09-12 ENCOUNTER — Encounter: Payer: Self-pay | Admitting: Family Medicine

## 2016-09-12 LAB — BASIC METABOLIC PANEL
BUN / CREAT RATIO: 14 (ref 9–23)
BUN: 41 mg/dL — AB (ref 6–24)
CHLORIDE: 107 mmol/L — AB (ref 96–106)
CO2: 17 mmol/L — AB (ref 18–29)
CREATININE: 2.97 mg/dL — AB (ref 0.57–1.00)
Calcium: 8.9 mg/dL (ref 8.7–10.2)
GFR calc Af Amer: 21 mL/min/{1.73_m2} — ABNORMAL LOW (ref >59)
GFR calc non Af Amer: 19 mL/min/{1.73_m2} — ABNORMAL LOW (ref >59)
GLUCOSE: 83 mg/dL (ref 65–99)
Potassium: 4.5 mmol/L (ref 3.5–5.2)
SODIUM: 143 mmol/L (ref 134–144)

## 2016-09-12 LAB — LIPID PANEL
CHOLESTEROL TOTAL: 148 mg/dL (ref 100–199)
Chol/HDL Ratio: 2.6 ratio (ref 0.0–4.4)
HDL: 57 mg/dL (ref >39)
LDL CALC: 77 mg/dL (ref 0–99)
TRIGLYCERIDES: 69 mg/dL (ref 0–149)
VLDL CHOLESTEROL CAL: 14 mg/dL (ref 5–40)

## 2016-09-12 LAB — CBC WITH DIFFERENTIAL/PLATELET
BASOS: 0 %
Basophils Absolute: 0 10*3/uL (ref 0.0–0.2)
EOS (ABSOLUTE): 0.1 10*3/uL (ref 0.0–0.4)
Eos: 1 %
HEMOGLOBIN: 15.2 g/dL (ref 11.1–15.9)
Hematocrit: 43.5 % (ref 34.0–46.6)
IMMATURE GRANS (ABS): 0 10*3/uL (ref 0.0–0.1)
IMMATURE GRANULOCYTES: 0 %
LYMPHS: 19 %
Lymphocytes Absolute: 2.1 10*3/uL (ref 0.7–3.1)
MCH: 28.6 pg (ref 26.6–33.0)
MCHC: 34.9 g/dL (ref 31.5–35.7)
MCV: 82 fL (ref 79–97)
Monocytes Absolute: 0.4 10*3/uL (ref 0.1–0.9)
Monocytes: 4 %
NEUTROS ABS: 8.1 10*3/uL — AB (ref 1.4–7.0)
NEUTROS PCT: 76 %
Platelets: 172 10*3/uL (ref 150–379)
RBC: 5.31 x10E6/uL — ABNORMAL HIGH (ref 3.77–5.28)
RDW: 15.9 % — ABNORMAL HIGH (ref 12.3–15.4)
WBC: 10.7 10*3/uL (ref 3.4–10.8)

## 2016-09-12 LAB — HIV ANTIBODY (ROUTINE TESTING W REFLEX): HIV SCREEN 4TH GENERATION: NONREACTIVE

## 2016-10-02 ENCOUNTER — Encounter: Payer: Self-pay | Admitting: Interventional Cardiology

## 2016-10-19 ENCOUNTER — Ambulatory Visit: Payer: Self-pay

## 2016-10-19 ENCOUNTER — Ambulatory Visit: Payer: Self-pay | Admitting: Interventional Cardiology

## 2016-10-22 ENCOUNTER — Encounter: Payer: Self-pay | Admitting: Interventional Cardiology

## 2016-11-23 ENCOUNTER — Encounter (INDEPENDENT_AMBULATORY_CARE_PROVIDER_SITE_OTHER): Payer: Self-pay

## 2016-11-23 ENCOUNTER — Encounter: Payer: Self-pay | Admitting: Internal Medicine

## 2016-11-23 ENCOUNTER — Ambulatory Visit (INDEPENDENT_AMBULATORY_CARE_PROVIDER_SITE_OTHER): Payer: Self-pay | Admitting: Internal Medicine

## 2016-11-23 VITALS — BP 160/110 | HR 96 | Ht 63.0 in | Wt 286.4 lb

## 2016-11-23 DIAGNOSIS — N184 Chronic kidney disease, stage 4 (severe): Secondary | ICD-10-CM

## 2016-11-23 DIAGNOSIS — I10 Essential (primary) hypertension: Secondary | ICD-10-CM

## 2016-11-23 DIAGNOSIS — R0789 Other chest pain: Secondary | ICD-10-CM

## 2016-11-23 DIAGNOSIS — I5030 Unspecified diastolic (congestive) heart failure: Secondary | ICD-10-CM

## 2016-11-23 MED ORDER — ATORVASTATIN CALCIUM 40 MG PO TABS: 40.0000 mg | ORAL_TABLET | Freq: Every day | ORAL | 1 refills | Status: DC

## 2016-11-23 MED ORDER — AMLODIPINE BESYLATE 10 MG PO TABS: 10.0000 mg | ORAL_TABLET | Freq: Every day | ORAL | 1 refills | Status: DC

## 2016-11-23 MED ORDER — HYDRALAZINE HCL 100 MG PO TABS: 100.0000 mg | ORAL_TABLET | Freq: Two times a day (BID) | ORAL | 1 refills | Status: DC

## 2016-11-23 MED ORDER — FUROSEMIDE 40 MG PO TABS: 40.0000 mg | ORAL_TABLET | Freq: Two times a day (BID) | ORAL | 1 refills | Status: DC

## 2016-11-23 MED ORDER — METOPROLOL TARTRATE 50 MG PO TABS: 50.0000 mg | ORAL_TABLET | Freq: Two times a day (BID) | ORAL | 1 refills | Status: DC

## 2016-11-23 NOTE — Patient Instructions (Signed)
Medication Instructions:  Your physician recommends that you continue on your current medications as directed. Please refer to the Current Medication list given to you today.   Labwork: None   Testing/Procedures: Your physician has requested that you have an echocardiogram. Echocardiography is a painless test that uses sound waves to create images of your heart. It provides your doctor with information about the size and shape of your heart and how well your heart's chambers and valves are working. This procedure takes approximately one hour. There are no restrictions for this procedure.    Follow-Up: Your physician recommends that you schedule a follow-up appointment in: 2 months with Dr End.         If you need a refill on your cardiac medications before your next appointment, please call your pharmacy.

## 2016-11-23 NOTE — Progress Notes (Signed)
New Outpatient Visit Date: 11/23/2016  Referring Provider: Archie Patten, MD [address], [place] [address]  Chief Complaint: Chest pain  HPI:  Brittney Contreras is a 41 y.o. female who is being seen today for the evaluation of chest pain at the request of Dr. Lorenso Courier. She has a history of hypertension, left ventricular hypertrophy, and chronic kidney disease stage IV. She carries the diagnosis of failure and hypertrophic cardiomyopathy. She presented with marked shortness of breath setting of severe hypertension 2014. Echo at that time showed preserved EF with significant LVH. Her hospitalization was complicated by severe renal insufficiency, for which she underwent hemodialysis fistula placement. She has not needed to initiate hemodialysis and continues to follow routinely with Dr. Jimmy Footman. Unfortunately, poorly controlled blood pressure has remained a problem for the patient, largely due to medication noncompliance. She reports that over the last 7-8 months, she has had 3-4 episodes of "light pressure and "left side of her chest. The episodes can occur while walking or sitting. The pain is maximal intensity is 3/10 and lasts 45-60 seconds. She denies associated symptoms including dyspnea, nausea, diaphoresis, lightheadedness. She has stable exertional dyspnea when walking half a mile she has not had any orthopnea but notes 2 episodes of PND, most recently last night. She has chronic bilateral leg swelling and has gained about 15 pounds in the last 7 months.  Brittney Contreras has never undergone an ischemia evaluation. She has not followed with a cardiologist either since her diagnosis of diastolic heart failure. She last took any of her medications 5 days ago, as she ran out. She states that medication affordability is not a problem at this time.  --------------------------------------------------------------------------------------------------  Cardiovascular History &  Procedures: Cardiovascular Problems:  Chest pain  Severe left ventricular hypertrophy; question hypertrophic cardiomyopathy  Risk Factors:  Hypertension  Cath/PCI:  None  CV Surgery:  None  EP Procedures and Devices:  None  Non-Invasive Evaluation(s):  TTE (06/18/14): Normal LV size with severe concentric hypertrophy, possibly consistent with hypertrophic cardiomyopathy. LVEF 60-65% with grade 1 diastolic dysfunction and elevated filling pressure. Mildly thickened mitral valve with moderate regurgitation. Moderate left atrial enlargement. Mild right atrial enlargement. Normal RV size and function.  Recent CV Pertinent Labs: Lab Results  Component Value Date   CHOL 148 09/11/2016   HDL 57 09/11/2016   LDLCALC 77 09/11/2016   TRIG 69 09/11/2016   CHOLHDL 2.6 09/11/2016   CHOLHDL 3.5 05/03/2016   K 4.5 09/11/2016   MG 1.6 07/26/2012   BUN 41 (H) 09/11/2016   CREATININE 2.97 (H) 09/11/2016   CREATININE 3.18 (H) 05/03/2016    --------------------------------------------------------------------------------------------------  Past Medical History:  Diagnosis Date  . Chronic kidney disease   . Diastolic heart failure: Grade 3  07/25/2012  . Hypertension   . Hypertensive hypertrophic cardiomyopathy ([place]) 07/28/2012  . Malignant hypertension with congestive heart failure ([place]) 07/25/2012  . Obesity, morbid ([place]) 07/25/2012  . Obstructive sleep apnea   . Tobacco abuse 07/27/2012    Past Surgical History:  Procedure Laterality Date  . AV FISTULA PLACEMENT Right 08/01/2012   Procedure: ARTERIOVENOUS (AV) FISTULA CREATION;  Surgeon: Mal Misty, MD;  Location: [facility];  Service: Vascular;  Laterality: Right;  Ultrasound guided; Attempted wrist AV Fistula Creation.  . AV FISTULA PLACEMENT Right 07/2012  . LIGATION OF COMPETING BRANCHES OF ARTERIOVENOUS FISTULA Right 11/05/2012   Procedure: LIGATION OF COMPETING BRANCHES OF ARTERIOVENOUS FISTULA;  Surgeon: Mal Misty, MD;   Location: [facility];  Service: Vascular;  Laterality: Right;  . SHUNTOGRAM Right 12/30/2012   Procedure: FISTULOGRAM;  Surgeon: Serafina Mitchell, MD;  Location: [facility] CATH LAB;  Service: Cardiovascular;  Laterality: Right;    Outpatient Encounter Prescriptions as of 11/23/2016  Medication Sig  . [DISCONTINUED] amLODipine (NORVASC) 10 MG tablet Take 1 tablet (10 mg total) by mouth at bedtime.  . [DISCONTINUED] atorvastatin (LIPITOR) 40 MG tablet Take 1 tablet (40 mg total) by mouth daily.  . [DISCONTINUED] calcitRIOL (ROCALTROL) 0.25 MCG capsule Take 1 capsule (0.25 mcg total) by mouth daily.  . [DISCONTINUED] furosemide (LASIX) 40 MG tablet Take 1 tablet (40 mg total) by mouth 2 (two) times daily.  . [DISCONTINUED] hydrALAZINE (APRESOLINE) 100 MG tablet Take 1 tablet (100 mg total) by mouth 2 (two) times daily.  . [DISCONTINUED] metoprolol (LOPRESSOR) 50 MG tablet Take 1 tablet (50 mg total) by mouth 2 (two) times daily.  . [DISCONTINUED] sodium bicarbonate 650 MG tablet Take 1 tablet (650 mg total) by mouth 2 (two) times daily.   No facility-administered encounter medications on file as of 11/23/2016.     Allergies: Patient has no known allergies.  Social History   Social History  . Marital status: Single    Spouse name: N/A  . Number of children: N/A  . Years of education: N/A   Occupational History  . Not on file.   Social History Main Topics  . Smoking status: Former Smoker    Packs/day: 0.30    Years: 20.00    Types: Cigarettes    Quit date: 07/2016  . Smokeless tobacco: Never Used     Comment: pack lasts 4-5 days  . Alcohol use No     Comment: occ  . Drug use: No     Comment: No hx of IV Use  . Sexual activity: Not on file   Other Topics Concern  . Not on file   Social History Narrative   Lives with her uncle   Was previously a CNA but currently unemployeed          Family History  Problem Relation Age of Onset  . Hypertension Mother   . Heart failure Mother   .  Cancer Father        Prostate  . Kidney disease Unknown        Aunt, deceased, had been on dialysis    Review of Systems: A 12-system review of systems was performed and was negative except as noted in the HPI.  --------------------------------------------------------------------------------------------------  Physical Exam: BP (!) 160/110   Pulse 96   Ht 5\' 3"  (1.6 m)   Wt 286 lb 6.4 oz (129.9 kg)   LMP 11/23/2016   BMI 50.73 kg/m   General:  Morbidly obese woman, seated comfortably in the exam room. HEENT: No conjunctival pallor or scleral icterus.  Moist mucous membranes.  OP clear. Neck: Supple without lymphadenopathy, thyromegaly, JVD, or HJR, though body habitus limits evaluation.  No carotid bruit. Lungs: Normal work of breathing.  Clear to auscultation bilaterally without wheezes or crackles. Heart: Regular rate and rhythm with S4 noted. No murmurs or rubs.  Unable to assess PMI due to body habitus. Abd: Bowel sounds present.  Soft, NT/ND . Unable to assess HSM due to body habitus. Ext: No lower extremity edema.  Right upper arm dialysis fistula with thrill. 2+ left radial and bilateral pedal pulses. Skin: warm and dry without rash Neuro: CNIII-XII intact.  Strength and fine-touch sensation intact in upper and lower extremities bilaterally.  Psych: Normal mood and affect.  EKG:  Normal sinus rhythm with borderline LVH and nonspecific T-wave changes. Atrial enlargement and mild to prolongation noted.  Lab Results  Component Value Date   WBC 10.7 09/11/2016   HGB 15.2 09/11/2016   HCT 43.5 09/11/2016   MCV 82 09/11/2016   PLT 172 09/11/2016    Lab Results  Component Value Date   NA 143 09/11/2016   K 4.5 09/11/2016   CL 107 (H) 09/11/2016   CO2 17 (L) 09/11/2016   BUN 41 (H) 09/11/2016   CREATININE 2.97 (H) 09/11/2016   GLUCOSE 83 09/11/2016   ALT 6 05/03/2016    Lab Results  Component Value Date   CHOL 148 09/11/2016   HDL 57 09/11/2016   LDLCALC 77  09/11/2016   TRIG 69 09/11/2016   CHOLHDL 2.6 09/11/2016    --------------------------------------------------------------------------------------------------  ASSESSMENT AND PLAN: Atypical chest pain Brittney Contreras's chest pain symptoms are atypical and infrequent. Her cardiac risk factors include hypertension, tobacco use, and morbid obesity, as well. We have agreed to obtain a transthoracic echocardiogram first reassess her LV function. If it is not severely reduced, we will myocardial perfusion stress testing. If there is severe LV dysfunction, we will need to consider proceeding with cardiac catheterization, though this is certain complicated by her deficiency. I also stressed the importance of medication compliance  Heart failure with preserved ejection fraction Patient has chronic leg edema that is likely multifactorial. Her echo in 2016 showed preserved LV function with diastolic dysfunction and moderate mitral regurgitation. Blood pressure and volume management will be key to managing this lipids symptoms. We will restart her prior blood pressure medications and diuretic regimen consisting of amlodipine, metoprolol, hydralazine, furosemide. She states that labs will be drawn later today by Dr. Jimmy Footman. We will obtain a transthoracic echocardiogram for reevaluation of the LV function as well as wall thickness.  Hypertension Blood pressure is poorly controlled today, consistent with multiple prior readings. I advised patient that she needs to be compliant with her medications and to contact us or PCP if she is close to running out. We will restart her prior regimen, which she reports having tolerated well.  Chronic kidney disease stage IV Patient follows closely with Dr. Jimmy Footman of nephrology. She is having labs drawn today. I will defer further management Dr. Jimmy Footman.  Follow-up: Return to clinic in 2 months.  Nelva Bush, MD 11/23/2016 9:19 AM

## 2016-12-06 ENCOUNTER — Other Ambulatory Visit: Payer: Self-pay

## 2016-12-06 ENCOUNTER — Ambulatory Visit (HOSPITAL_COMMUNITY): Payer: Self-pay | Attending: Cardiology

## 2016-12-06 DIAGNOSIS — I517 Cardiomegaly: Secondary | ICD-10-CM | POA: Insufficient documentation

## 2016-12-06 DIAGNOSIS — R0789 Other chest pain: Secondary | ICD-10-CM | POA: Insufficient documentation

## 2016-12-06 DIAGNOSIS — I5030 Unspecified diastolic (congestive) heart failure: Secondary | ICD-10-CM | POA: Insufficient documentation

## 2016-12-12 ENCOUNTER — Telehealth: Payer: Self-pay | Admitting: *Deleted

## 2016-12-12 DIAGNOSIS — R0789 Other chest pain: Secondary | ICD-10-CM

## 2016-12-12 NOTE — Telephone Encounter (Signed)
Notes recorded by Nelva Bush, MD on 12/07/2016 at 5:05 PM EDT Please let Brittney Contreras know that her echo shows normal contraction of her heart. The muscle is thickened and stiff, which has been noted before and is at least in part due to her uncontrolled hypertension. As we discussed at her recent visit, I recommend that we obtain a pharmacologic myocardial perfusion stress test to further evaluate her chest pain. We will f/u after completion of the stress test. Thanks.

## 2016-12-12 NOTE — Telephone Encounter (Signed)
Reviewed instructions for stress myoview with patient.

## 2016-12-18 ENCOUNTER — Telehealth (HOSPITAL_COMMUNITY): Payer: Self-pay | Admitting: *Deleted

## 2016-12-18 NOTE — Telephone Encounter (Signed)
Left message on voicemail in reference to upcoming appointment scheduled for 12/20/16 Phone number given for a call back so details instructions can be given. Brittney Contreras

## 2016-12-20 ENCOUNTER — Ambulatory Visit (HOSPITAL_COMMUNITY): Payer: Self-pay

## 2016-12-21 ENCOUNTER — Ambulatory Visit (HOSPITAL_COMMUNITY): Payer: Self-pay

## 2017-01-07 ENCOUNTER — Encounter: Payer: Self-pay | Admitting: *Deleted

## 2017-01-17 ENCOUNTER — Telehealth (HOSPITAL_COMMUNITY): Payer: Self-pay | Admitting: *Deleted

## 2017-01-17 NOTE — Telephone Encounter (Signed)
Attempted to call regarding upcoming appointment on 01/21/17- busy x 2.  Kirstie Peri

## 2017-01-21 ENCOUNTER — Ambulatory Visit (HOSPITAL_COMMUNITY): Payer: Self-pay

## 2017-01-22 ENCOUNTER — Ambulatory Visit (HOSPITAL_COMMUNITY): Payer: Self-pay

## 2017-01-24 ENCOUNTER — Ambulatory Visit: Payer: Self-pay | Admitting: Internal Medicine

## 2017-01-24 ENCOUNTER — Telehealth (HOSPITAL_COMMUNITY): Payer: Self-pay | Admitting: *Deleted

## 2017-01-24 NOTE — Telephone Encounter (Signed)
Attempted to call patient regarding upcoming appointment- no answer.  Brittney Contreras  

## 2017-01-28 ENCOUNTER — Ambulatory Visit (HOSPITAL_COMMUNITY): Payer: Self-pay | Attending: Internal Medicine

## 2017-01-28 DIAGNOSIS — R0789 Other chest pain: Secondary | ICD-10-CM | POA: Insufficient documentation

## 2017-01-28 MED ORDER — REGADENOSON 0.4 MG/5ML IV SOLN
0.4000 mg | Freq: Once | INTRAVENOUS | Status: AC
  Administered 2017-01-28: 0.4 mg via INTRAVENOUS

## 2017-01-28 MED ORDER — TECHNETIUM TC 99M TETROFOSMIN IV KIT
32.4000 | PACK | Freq: Once | INTRAVENOUS | Status: AC | PRN
  Administered 2017-01-28: 32.4 via INTRAVENOUS
  Filled 2017-01-28: qty 33

## 2017-01-29 ENCOUNTER — Ambulatory Visit (HOSPITAL_COMMUNITY): Payer: Self-pay | Attending: Cardiology

## 2017-01-29 LAB — MYOCARDIAL PERFUSION IMAGING
LHR: 0.25
LV dias vol: 202 mL (ref 46–106)
LV sys vol: 107 mL
NUC STRESS TID: 0.99
Peak HR: 114 {beats}/min
Rest HR: 93 {beats}/min
SDS: 6
SRS: 4
SSS: 10

## 2017-01-29 MED ORDER — TECHNETIUM TC 99M TETROFOSMIN IV KIT
32.5000 | PACK | Freq: Once | INTRAVENOUS | Status: AC | PRN
  Administered 2017-01-29: 32.5 via INTRAVENOUS
  Filled 2017-01-29: qty 33

## 2017-02-13 NOTE — Progress Notes (Signed)
Patient ID: Brittney Contreras                 DOB: 04/10/1976                      MRN: [id number]     HPI: Brittney Contreras is a 41 y.o. female referred by Dr. Saunders Revel to HTN clinic. PMH is significant for HTN, left ventricular hypertrophy, and CKD stage IV. She presented with marked shortness of breath setting of severe hypertension in 2014. Echo at that time showed preserved EF with significant LVH. Her hospitalization was complicated by severe renal insufficiency, for which she underwent hemodialysis fistula placement. She has not needed to initiate hemodialysis and continues to follow routinely with Dr. Jimmy Footman. Managing her blood pressure has been historically challenging, due to noncompliance. At her OV with Dr. Saunders Revel on 11/23/16, her blood pressure was above goal and elevated at 160/150mmHg. Compliance was encouraged with her current regimen. Pt reported no issues with medication costs at that time, although she had stopped taking all of her BP medications previously. Her home regimen was resumed and she presents today for follow up.  Pt reports feeling ok overall She denies any dizziness or falls. She does endorse headaches (2-3 times/week) and fatigue. She states that she can not necessarily feel when her blood pressure is too high. She reports noncompliance with ALL of her medications since the end of March 2018. She presented with her medications bottles that were filled 3/1, all with a 30 day supply. She has been noncompliant due to medication costs. She states she no longer has Medicaid as she was not re-approved for 2018 due to untimely paperwork submission. She has no prescription coverage for now but she is working with [place], Kennyth Lose, who can be reached at [phone]. She states she has been emotional lately and in turn has been making poorer diet choices and eating bigger servings.  BP in clinic elevated at 200/168. Clonidine 0.2mg  was administered. F/u BP was  204/159mmHg. Discussed with DOD Dr Rayann Heman who is ok with pt leaving clinic today.   Current HTN meds:  Nonadherent to all medications: Amlodipine 10 mg daily Furosemide 40 mg twice daily  Hydralazine 100 mg twice daily Lopressor 50 mg twice daily   BP goal: <130/80 mmHg  Family History: Mother has HTN/HF, Father had prostate cancer, aunt/uncle had kidney disease dependent on dialysis.  Social History: Single with no children. Former cigarette smoker for 20 years. Never used smokeless tobacco. No current alcohol or drug use. She lives with her uncle.   Diet: Breakfast: fried chicken, Lunch: does not always eat, Dinner: spaghetti, fried chicken, garlic bread. Not too much of a sweet tooth. States healthy diet is also limited due to cost. Drink water. Not big on caffeine.   Exercise: Loves to walk, right ankle and left knee limit her from activity.   Home BP readings: No blood pressure meter at home.   Wt Readings from Last 3 Encounters:  01/28/17 286 lb (129.7 kg)  11/23/16 286 lb 6.4 oz (129.9 kg)  09/11/16 273 lb 12.8 oz (124.2 kg)   BP Readings from Last 3 Encounters:  11/23/16 (!) 160/110  09/11/16 (!) 148/92  05/03/16 (!) 220/160   Pulse Readings from Last 3 Encounters:  11/23/16 96  09/11/16 77  05/03/16 97    Renal function: CrCl cannot be calculated (Patient's most recent lab result is older than the maximum 21 days  allowed.).  Past Medical History:  Diagnosis Date  . Chronic kidney disease   . Diastolic heart failure: Grade 3  07/25/2012  . Hypertension   . Hypertensive hypertrophic cardiomyopathy ([place]) 07/28/2012  . Malignant hypertension with congestive heart failure ([place]) 07/25/2012  . Obesity, morbid ([place]) 07/25/2012  . Obstructive sleep apnea   . Tobacco abuse 07/27/2012    Current Outpatient Prescriptions on File Prior to Visit  Medication Sig Dispense Refill  . amLODipine (NORVASC) 10 MG tablet Take 1 tablet (10 mg total) by mouth daily. 90 tablet 1  .  atorvastatin (LIPITOR) 40 MG tablet Take 1 tablet (40 mg total) by mouth daily. 90 tablet 1  . furosemide (LASIX) 40 MG tablet Take 1 tablet (40 mg total) by mouth 2 (two) times daily. 180 tablet 1  . hydrALAZINE (APRESOLINE) 100 MG tablet Take 1 tablet (100 mg total) by mouth 2 (two) times daily. 180 tablet 1  . metoprolol tartrate (LOPRESSOR) 50 MG tablet Take 1 tablet (50 mg total) by mouth 2 (two) times daily. 180 tablet 1   No current facility-administered medications on file prior to visit.     No Known Allergies  Assessment/Plan:  1. Hypertension: Blood pressure is poorly controlled today due to medication noncompliance and financial concerns. Initial BP in clinic was elevated to 200/168 mmHg. She was given 0.2mg  clonidine in office to acutely lower her blood pressure. Around 15 minutes after clonidine administration, blood pressure was still quite high at 204/130 mmHg, far above goal <130/52mmHg. Patient was adamant about leaving and was educated on the signs/symptoms of a stroke and to immediately go to the ER if these symptoms occur. Unfortunately, pt turned in her Medicaid application late and did not qualify, and she cannot afford $4 HTN medication copays.  We will consult with the [facility] to see if there are openings soon with either clinic to help with medication affordability. She will be noncompliant with her medications until this time due to cost.   She was educated on dietary changes in the interim, and we made goals to decrease serving sizes, reduce sodium intake, and incorporate more vegetables in her diet.    Marleena Shubert L. Kyung Rudd, PharmD, [facility] PGY1 Pharmacy Resident [place]

## 2017-02-14 ENCOUNTER — Ambulatory Visit (INDEPENDENT_AMBULATORY_CARE_PROVIDER_SITE_OTHER): Payer: Self-pay | Admitting: Pharmacist

## 2017-02-14 VITALS — BP 204/130 | HR 88

## 2017-02-14 DIAGNOSIS — I10 Essential (primary) hypertension: Secondary | ICD-10-CM

## 2017-02-14 MED ORDER — CLONIDINE HCL 0.1 MG PO TABS: 0.2000 mg | ORAL_TABLET | Freq: Once | ORAL | Status: AC

## 2017-02-19 ENCOUNTER — Telehealth: Payer: Self-pay

## 2017-02-28 NOTE — Telephone Encounter (Signed)
Pt was called to notify of possible enrollment in the [facility] MedAssist Program to get her meds covered and sent to her home for free. She was instructed that we will mail the form to her. She will need to return it to clinic for completion. Pt verbalized understanding.

## 2017-03-14 NOTE — Telephone Encounter (Signed)
Pt came to clinic today to pick up patient assistance paperwork. She has been having trouble receiving mail and did not get the application form in the mail. She will bring completed paperwork to her follow up appt with Dr End so we can submit it.

## 2017-03-22 ENCOUNTER — Telehealth: Payer: Self-pay | Admitting: Pharmacist

## 2017-03-22 ENCOUNTER — Encounter: Payer: Self-pay | Admitting: Internal Medicine

## 2017-03-22 ENCOUNTER — Ambulatory Visit (INDEPENDENT_AMBULATORY_CARE_PROVIDER_SITE_OTHER): Payer: Self-pay | Admitting: Internal Medicine

## 2017-03-22 VITALS — BP 180/120 | HR 97 | Resp 16 | Ht 63.0 in | Wt 286.0 lb

## 2017-03-22 DIAGNOSIS — I5032 Chronic diastolic (congestive) heart failure: Secondary | ICD-10-CM

## 2017-03-22 DIAGNOSIS — I10 Essential (primary) hypertension: Secondary | ICD-10-CM

## 2017-03-22 NOTE — Telephone Encounter (Signed)
Pt aware to contact [place] at [phone] for new pt appt to hopefully help with cost of BP medications.

## 2017-03-22 NOTE — Progress Notes (Signed)
Follow-up Outpatient Visit Date: 03/22/2017  Primary Care Provider: Archie Patten, MD No address on file  Chief Complaint: Follow-up high blood pressure  HPI:  Ms. Riederer is a 41 y.o. year-old female with history of hypertension, LVH, and chronic kidney disease stage IV, who presents for follow-up of hypertension.  I last saw her in June, at which time Ms. Sinha had not been taking any of her blood pressure medications.  She was found to be severely hypertensive.  I asked her to restart her previous regimen, which had been controlling her blood pressure well.  However, she has been unable to afford any of her medications and has remained off therapy since her last visit.  Today, Ms. Luckadoo reports feeling fatigued from time to time.  She notes only one brief episode of chest pain a few months ago that lasted a few seconds before resolving spontaneously.  Exertional dyspnea is unchanged.  Myocardial perfusion stress test after our last visit was a low risk, though LVEF was mildly reduced and radiotracer pattern suggested hypertrophic cardiomyopathy.  Preceding echo had also shown moderate LVH with preserved LVEF.  --------------------------------------------------------------------------------------------------  Cardiovascular History & Procedures: Cardiovascular Problems:  Uncontrolled hypertension  Chronic diastolic heart failure and left ventricular hypertrophy  Risk Factors:  Hypertension, obesity, and sedentary lifestyle  Cath/PCI:  None  CV Surgery:  None  EP Procedures and Devices:  None  Non-Invasive Evaluation(s):  Pharmacologic MPI (01/28/17): Low risk study with increased wall thickness and prominence of the anterolateral wall suggestive of hypertrophy or hypertrophic cardiomyopathy.  LVEF 47%.  TTE (12/06/16): Normal LV size with moderate LVH.  LVEF 55% with normal wall motion.  Grade 2 diastolic dysfunction.  Trivial aortic regurgitation.  Moderate to  severe left atrial enlargement.  Normal RV size and function.  Mild right atrial enlargement.  Normal CVP.  Recent CV Pertinent Labs: Lab Results  Component Value Date   CHOL 148 09/11/2016   HDL 57 09/11/2016   LDLCALC 77 09/11/2016   TRIG 69 09/11/2016   CHOLHDL 2.6 09/11/2016   CHOLHDL 3.5 05/03/2016   K 4.5 09/11/2016   MG 1.6 07/26/2012   BUN 41 (H) 09/11/2016   CREATININE 2.97 (H) 09/11/2016   CREATININE 3.18 (H) 05/03/2016    Past medical and surgical history were reviewed and updated in EPIC.  No outpatient prescriptions have been marked as taking for the 03/22/17 encounter (Office Visit) with Calyb Mcquarrie, Harrell Gave, MD.   Current Facility-Administered Medications for the 03/22/17 encounter (Office Visit) with Trajon Rosete, Harrell Gave, MD  Medication  . cloNIDine (CATAPRES) tablet 0.2 mg    Allergies: Patient has no known allergies.  Social History   Social History  . Marital status: Single    Spouse name: N/A  . Number of children: N/A  . Years of education: N/A   Occupational History  . Not on file.   Social History Main Topics  . Smoking status: Former Smoker    Packs/day: 0.30    Years: 20.00    Types: Cigarettes    Quit date: 07/2016  . Smokeless tobacco: Never Used     Comment: pack lasts 4-5 days  . Alcohol use No     Comment: occ  . Drug use: No     Comment: No hx of IV Use  . Sexual activity: Not on file   Other Topics Concern  . Not on file   Social History Narrative   Lives with her uncle   Was previously a CNA but currently  unemployeed          Family History  Problem Relation Age of Onset  . Hypertension Mother   . Heart failure Mother   . Prostate cancer Father   . Kidney disease Unknown        Aunt, deceased, had been on dialysis    Review of Systems: A 12-system review of systems was performed and was negative except as noted in the  HPI.  --------------------------------------------------------------------------------------------------  Physical Exam: BP (!) 180/120   Pulse 97   Resp 16   Ht 5\' 3"  (1.6 m)   Wt 286 lb (129.7 kg)   LMP 03/15/2017   SpO2 99%   BMI 50.66 kg/m  Repeat BP: 178/110  General: Morbidly obese woman, seated comfortably in the exam room. HEENT: No conjunctival pallor or scleral icterus. Moist mucous membranes.  OP clear. Neck: Supple without lymphadenopathy, thyromegaly, JVD, or HJR. No carotid bruit. Lungs: Normal work of breathing. Clear to auscultation bilaterally without wheezes or crackles. Heart: Distant heart sounds.  Regular rate and rhythm without murmurs.  Unable to assess PMI due to body habitus. Abd: Bowel sounds present. Soft, NT/ND.  Unable to assess HSM due to body habitus.  Ext: No lower extremity edema. Radial, PT, and DP pulses are 2+ bilaterally. Skin: Warm and dry without rash.   Lab Results  Component Value Date   WBC 10.7 09/11/2016   HGB 15.2 09/11/2016   HCT 43.5 09/11/2016   MCV 82 09/11/2016   PLT 172 09/11/2016    Lab Results  Component Value Date   NA 143 09/11/2016   K 4.5 09/11/2016   CL 107 (H) 09/11/2016   CO2 17 (L) 09/11/2016   BUN 41 (H) 09/11/2016   CREATININE 2.97 (H) 09/11/2016   GLUCOSE 83 09/11/2016   ALT 6 05/03/2016    Lab Results  Component Value Date   CHOL 148 09/11/2016   HDL 57 09/11/2016   LDLCALC 77 09/11/2016   TRIG 69 09/11/2016   CHOLHDL 2.6 09/11/2016    --------------------------------------------------------------------------------------------------  ASSESSMENT AND PLAN: Uncontrolled hypertension Ms. Hollinsworth remains markedly hypertensive today in the setting of not taking any medications for at least 6 months.  Financial constraints seem to be her biggest concern.  Somehow, she let her Medicaid lapse.  We have instructed her to go to social services to reapply for Medicaid.  We will also look for alternative  options for medication assistance.  I have provided her with samples for nebivolol 5 mg daily, to take for the next 2 weeks.  If she tolerates this dose, she was given samples to increase her nebivolol to 10 mg daily thereafter.  I will have her return in 1 month for blood pressure check in the hypertension clinic.  We have also referred her to the [facility] to establish with a PCP there, as she would likely be able to obtain many of her medications through them at little or no cost.  Chronic diastolic heart failure Ms. Brubacher appears grossly euvolemic with a stable weight, though her morbid obesity makes examination challenging.  She has NYHA class II symptoms.  The most important step for her will be better blood pressure control.  We have started nebivolol, as above.  If she is able to get medication assistance or establish with a PCP at the [facility], hopefully we can make additional medication changes.  Weight loss was encouraged.  Follow-up: Return to clinic in 3 months.  Nelva Bush, MD 03/23/2017 1:37 PM

## 2017-03-22 NOTE — Patient Instructions (Signed)
Medication Instructions:  Start Bystolic 5 mg daily for 2 weeks, then increase to Bystolic 10 mg daily  Labwork: None   Testing/Procedures: None   Follow-Up: Your physician recommends that you schedule a follow-up appointment in: 1 month in the Hypertension Clinic  Your physician recommends that you schedule a follow-up appointment in: 3 months with Dr End.    Any Other Special Instructions Will Be Listed Below (If Applicable).  You have been referred to [facility] to get established with provider there.   If you need a refill on your cardiac medications before your next appointment, please call your pharmacy.

## 2017-03-23 ENCOUNTER — Encounter: Payer: Self-pay | Admitting: Internal Medicine

## 2017-03-23 DIAGNOSIS — I5032 Chronic diastolic (congestive) heart failure: Secondary | ICD-10-CM | POA: Insufficient documentation

## 2017-03-25 ENCOUNTER — Telehealth: Payer: Self-pay | Admitting: Family Medicine

## 2017-03-25 NOTE — Telephone Encounter (Signed)
I called patient to try and get them scheduled for an appointment and the patient didn't answer.

## 2017-03-25 NOTE — Telephone Encounter (Signed)
-----   Message from Rica Mast, [place] sent at 03/22/2017  2:08 PM EDT ----- Brittney Contreras!  Have her call [phone] for an appt. I am cc'ing one of our front office staff members on this message so she will be ready and try to get her in as soon as we can (hopefully November).  Also, I see she is from [facility]. They used to have an emergency fund for patients to pay for medications which might help her in the mean time since she is still technically established there if she needs medications ASAP.  Let me know if you need anything else!  Erline Levine    ----- Message ----- From: Leeroy Bock, [facility] Sent: 03/22/2017   1:49 PM To: Rica Mast, [place],  This was the patient I asked about a few months ago for medication assistance. I know you said the wait time to get in to see a PCP at [facility] was pretty long, but she came in today for follow up with Dr End and still hadn't completed all of the paperwork for the [place] [facility] program. I was hoping you guys might be able to take her on as a patient to help with BP medication costs, even if she has to wait a few months to be seen.  Thank you! Jinny Blossom

## 2017-03-28 ENCOUNTER — Ambulatory Visit: Payer: Self-pay | Attending: Internal Medicine | Admitting: Internal Medicine

## 2017-03-28 ENCOUNTER — Encounter: Payer: Self-pay | Admitting: Internal Medicine

## 2017-03-28 VITALS — BP 173/126 | HR 77 | Temp 98.3°F | Resp 16 | Wt 306.8 lb

## 2017-03-28 DIAGNOSIS — E785 Hyperlipidemia, unspecified: Secondary | ICD-10-CM

## 2017-03-28 DIAGNOSIS — I132 Hypertensive heart and chronic kidney disease with heart failure and with stage 5 chronic kidney disease, or end stage renal disease: Secondary | ICD-10-CM | POA: Insufficient documentation

## 2017-03-28 DIAGNOSIS — I5032 Chronic diastolic (congestive) heart failure: Secondary | ICD-10-CM

## 2017-03-28 DIAGNOSIS — I150 Renovascular hypertension: Secondary | ICD-10-CM

## 2017-03-28 DIAGNOSIS — I129 Hypertensive chronic kidney disease with stage 1 through stage 4 chronic kidney disease, or unspecified chronic kidney disease: Secondary | ICD-10-CM

## 2017-03-28 DIAGNOSIS — Z6841 Body Mass Index (BMI) 40.0 and over, adult: Secondary | ICD-10-CM | POA: Insufficient documentation

## 2017-03-28 DIAGNOSIS — N184 Chronic kidney disease, stage 4 (severe): Secondary | ICD-10-CM

## 2017-03-28 DIAGNOSIS — Z87891 Personal history of nicotine dependence: Secondary | ICD-10-CM

## 2017-03-28 MED ORDER — AMLODIPINE BESYLATE 5 MG PO TABS: 5.0000 mg | ORAL_TABLET | Freq: Every day | ORAL | 1 refills | Status: DC

## 2017-03-28 MED ORDER — HYDRALAZINE HCL 50 MG PO TABS: 50.0000 mg | ORAL_TABLET | Freq: Two times a day (BID) | ORAL | 6 refills | Status: DC

## 2017-03-28 MED ORDER — FUROSEMIDE 40 MG PO TABS: 40.0000 mg | ORAL_TABLET | Freq: Every day | ORAL | 1 refills | Status: DC

## 2017-03-28 MED ORDER — ATORVASTATIN CALCIUM 20 MG PO TABS: 20.0000 mg | ORAL_TABLET | Freq: Every day | ORAL | 1 refills | Status: DC
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
## 2017-03-28 NOTE — Patient Instructions (Addendum)
Give appt with Stacy in 2 wks for BP recheck.   Follow a Healthy Eating Plan - You can do it! Limit sugary drinks.  Avoid sodas, sweet tea, sport or energy drinks, or fruit drinks.  Drink water, lo-fat milk, or diet drinks. Limit snack foods.   Cut back on candy, cake, cookies, chips, ice cream.  These are a special treat, only in small amounts. Eat plenty of vegetables.  Especially dark green, red, and orange vegetables. Aim for at least 3 servings a day. More is better! Include fruit in your daily diet.  Whole fruit is much healthier than fruit juice! Limit "white" bread, "white" pasta, "white" rice.   Choose "100% whole grain" products, brown or wild rice. Avoid fatty meats. Try "Meatless Monday" and choose eggs or beans one day a week.  When eating meat, choose lean meats like chicken, [place], and fish.  Grill, broil, or bake meats instead of frying, and eat poultry without the skin. Eat less salt.  Avoid frozen pizzas, frozen dinners and salty foods.  Use seasonings other than salt in cooking.  This can help blood pressure and keep you from swelling Beer, wine and liquor have calories.  If you can safely drink alcohol, limit to 1 drink per day for women, 2 drinks for men

## 2017-03-29 LAB — COMPREHENSIVE METABOLIC PANEL
A/G RATIO: 1.3 (ref 1.2–2.2)
ALK PHOS: 69 IU/L (ref 39–117)
ALT: 26 IU/L (ref 0–32)
AST: 26 IU/L (ref 0–40)
Albumin: 3.5 g/dL (ref 3.5–5.5)
BILIRUBIN TOTAL: 0.4 mg/dL (ref 0.0–1.2)
BUN/Creatinine Ratio: 11 (ref 9–23)
BUN: 39 mg/dL — ABNORMAL HIGH (ref 6–24)
CALCIUM: 8.2 mg/dL — AB (ref 8.7–10.2)
CHLORIDE: 114 mmol/L — AB (ref 96–106)
CO2: 17 mmol/L — ABNORMAL LOW (ref 20–29)
Creatinine, Ser: 3.41 mg/dL — ABNORMAL HIGH (ref 0.57–1.00)
GFR calc Af Amer: 18 mL/min/{1.73_m2} — ABNORMAL LOW (ref >59)
GFR, EST NON AFRICAN AMERICAN: 16 mL/min/{1.73_m2} — AB (ref >59)
GLOBULIN, TOTAL: 2.7 g/dL (ref 1.5–4.5)
Glucose: 76 mg/dL (ref 65–99)
POTASSIUM: 5.1 mmol/L (ref 3.5–5.2)
SODIUM: 146 mmol/L — AB (ref 134–144)
Total Protein: 6.2 g/dL (ref 6.0–8.5)

## 2017-03-29 LAB — CBC
HEMOGLOBIN: 13.4 g/dL (ref 11.1–15.9)
Hematocrit: 39.5 % (ref 34.0–46.6)
MCH: 28.4 pg (ref 26.6–33.0)
MCHC: 33.9 g/dL (ref 31.5–35.7)
MCV: 84 fL (ref 79–97)
PLATELETS: 181 10*3/uL (ref 150–379)
RBC: 4.72 x10E6/uL (ref 3.77–5.28)
RDW: 16.3 % — ABNORMAL HIGH (ref 12.3–15.4)
WBC: 8.7 10*3/uL (ref 3.4–10.8)

## 2017-04-03 ENCOUNTER — Telehealth: Payer: Self-pay

## 2017-04-03 NOTE — Telephone Encounter (Signed)
Contacted pt to go over lab pt didn't answer lvm asking pt to give me a call at her earliest convenience   If pt calls back please give results: kidney function remains poor and a little worse compared to 6 mths ago. She needs to apply for [facility] discount so we can get her back to nephrology. Also looks like she forgot to stop at check out to get f/u appt with me in 1 mth. Please schedule.

## 2017-04-04 MED FILL — AMLODIPINE BESYLATE 5 MG TA: 5 | 30 days supply | Qty: 30 | Fill #0

## 2017-04-04 MED FILL — ?ATORVASTATIN 20 MG TABLET: 20 | 30 days supply | Qty: 30 | Fill #0

## 2017-04-04 MED FILL — hydrALAZINE HCL 50 MG TABS: 50 | 30 days supply | Qty: 60 | Fill #0

## 2017-04-04 MED FILL — ?FUROSEMIDE 40 MG TABLET: 40 | 30 days supply | Qty: 30 | Fill #0

## 2017-04-17 NOTE — Progress Notes (Signed)
Patient ID: Brittney Contreras                 DOB: December 24, 1975                      MRN: [id number]     HPI: Brittney Contreras is a 41 y.o. female referred by Dr. Saunders Revel to HTN clinic. PMH is significant for HTN, LVH, CKD IV, HFpEF (EF 55%). Pt has not needed to initiate HD for renal insufficiency and is followed by Dr. Jimmy Footman. BP management has been historically challenging due to noncompliance. At HTN clinic visit 02/14/17 pt had been non-compliant to all medications since the end of March 2018 due to cost as she no longer has Medicaid. SBP was >200 mmHg requiring clonidine x1 in clinic. Pt given information for [facility] and [facility]. At last OV with Dr. Saunders Revel 03/22/17, pt was given samples of nebivolol 22m daily with instructions to titrate up to 165mdaily if tolerated. [facility] and was given assistance for medications, which were resumed at lower doses.  Pt presents ambulating in good spirits. Pt denies dizziness, falls, HA, or blurry vision. She has titrated her nebivolol to 1039mbut she is running low on the samples. Pt reports compliance with Lasix, amlodipine, and hydralazine but is running low on her current supply. [facility], but she was able to get assistance from her church in October. Pt reports she has not met with Medicaid yet to renew for the new year but is planning to. Pt has been been able to increase physical activity and does not have a BP meter at home.  Current HTN Meds: Nebivolol 43m23mily, Lasix 40mg84mly, hydralazine 50mg 8m amlodipine 5mg da52m  Previous HTN meds: lopressor 50mg BI36mhanged to Bystolic as samples were available), hydralazine 100mg BID63mstarted at lower dose as pt had remained non-compliant), amlodipine 10 mg daily (restarted at lower dose as pt had remained non-compliant)  BP goal: <130/80 mmHg  Family History:  Mother has HTN/HF, Father had prostate cancer, aunt/uncle had kidney disease dependent on dialysis.  Social History: Single with no children. Former cigarette smoker for 20 years. Never used smokeless tobacco. No current alcohol or drug use. She lives with her uncle.   Diet: Breakfast: fried chicken, Lunch: does not always eat, Dinner: spaghetti, fried chicken, garlic bread. Not too much of a sweet tooth. States healthy diet is also limited due to cost. Drinks water. Pt drinks coffee but "every once in awhile"  Exercise: Loves to walk, right ankle and left knee limit her from activity.  Home BP readings: No blood pressure meter at home.   Wt Readings from Last 3 Encounters:  03/28/17 (!) 306 lb 12.8 oz (139.2 kg)  03/22/17 286 lb (129.7 kg)  01/28/17 286 lb (129.7 kg)   BP Readings from Last 3 Encounters:  03/28/17 (!) 173/126  03/22/17 (!) 180/120  02/14/17 (!) 204/130   Pulse Readings from Last 3 Encounters:  03/28/17 77  03/22/17 97  02/14/17 88    Renal function: CrCl cannot be calculated (Unknown ideal weight.).  Past Medical History:  Diagnosis Date  . Chronic kidney disease   . Diastolic heart failure: Grade 3  07/25/2012  . Hypertension   . Hypertensive hypertrophic cardiomyopathy (HCC) 2/24[place]14  . Malignant hypertension with congestive heart failure (HCC) 2/21[place]14  . Obesity, morbid (  [place]) 07/25/2012  . Obstructive sleep apnea   . Tobacco abuse 07/27/2012    Current Outpatient Medications on File Prior to Visit  Medication Sig Dispense Refill  . amLODipine (NORVASC) 5 MG tablet Take 1 tablet (5 mg total) by mouth daily. 90 tablet 1  . atorvastatin (LIPITOR) 20 MG tablet Take 1 tablet (20 mg total) by mouth daily. 90 tablet 1  . furosemide (LASIX) 40 MG tablet Take 1 tablet (40 mg total) by mouth daily. 90 tablet 1  . hydrALAZINE (APRESOLINE) 50 MG tablet Take 1 tablet (50 mg total) by mouth 2 (two) times daily. 60 tablet 6  . nebivolol (BYSTOLIC) 5 MG tablet  Take 1 tablet (5 mg)  by mouth daily for 2 weeks, then increase to 2 tablets (10 mg) daily     Current Facility-Administered Medications on File Prior to Visit  Medication Dose Route Frequency Provider Last Rate Last Dose  . cloNIDine (CATAPRES) tablet 0.2 mg  0.2 mg Oral Once End, Christopher, MD        No Known Allergies   Assessment/Plan:  1. Hypertension - BP remains above goal <130/80 mmHg but is significantly improved with re-addition of her antihypertensives. Will continue hydralazine 49m BID and furosemide 445mdaily. Will increase amlodipine to 1087maily and send in a refill for nebivolol 54m70mily, but may need to transition this to carvedilol for lower cost. Pt encouraged to follow-up with Medicaid renewal and call clinic if she experiences difficulty with obtaining medications in the future. If BP remains uncontrolled, consider titrating hydralazine back to 100mg41m as she was previously on. F/U with HTN clinic in 4 weeks.  MichaArrie SenatermD PGY-2 Cardiology Pharmacy Resident Pager: [phone]

## 2017-04-18 ENCOUNTER — Ambulatory Visit (INDEPENDENT_AMBULATORY_CARE_PROVIDER_SITE_OTHER): Payer: Self-pay | Admitting: Pharmacist

## 2017-04-18 DIAGNOSIS — I150 Renovascular hypertension: Secondary | ICD-10-CM

## 2017-04-18 MED ORDER — AMLODIPINE BESYLATE 10 MG PO TABS: 10.0000 mg | ORAL_TABLET | Freq: Every day | ORAL | 11 refills | Status: DC

## 2017-04-18 MED ORDER — NEBIVOLOL HCL 10 MG PO TABS: 10.0000 mg | ORAL_TABLET | Freq: Every day | ORAL | 11 refills | Status: DC

## 2017-04-18 NOTE — Patient Instructions (Signed)
It was great to see you today. Your blood pressure looks much improved on the medicines!  Continue taking furosemide(Lasix) 40mg  daily and hydralazine 50mg  twice daily.  Increase your amlodipine to 10mg  daily.  Continue taking nebivolol (Bystolic) 10mg  daily. We will send a new prescription in, if you have trouble getting this or any other medicines covered please contact us or Dr. Wynetta Emery.  Follow-up in clinic in 1 month.

## 2017-05-16 ENCOUNTER — Ambulatory Visit: Payer: Self-pay

## 2017-06-20 ENCOUNTER — Encounter: Payer: Self-pay | Admitting: Internal Medicine

## 2017-06-20 ENCOUNTER — Ambulatory Visit (INDEPENDENT_AMBULATORY_CARE_PROVIDER_SITE_OTHER): Payer: Self-pay | Admitting: Internal Medicine

## 2017-06-20 VITALS — BP 142/102 | HR 89 | Ht 63.0 in | Wt 311.5 lb

## 2017-06-20 DIAGNOSIS — N184 Chronic kidney disease, stage 4 (severe): Secondary | ICD-10-CM

## 2017-06-20 DIAGNOSIS — I150 Renovascular hypertension: Secondary | ICD-10-CM

## 2017-06-20 DIAGNOSIS — M545 Low back pain, unspecified: Secondary | ICD-10-CM | POA: Insufficient documentation

## 2017-06-20 DIAGNOSIS — N926 Irregular menstruation, unspecified: Secondary | ICD-10-CM

## 2017-06-20 DIAGNOSIS — R1033 Periumbilical pain: Secondary | ICD-10-CM | POA: Insufficient documentation

## 2017-06-20 LAB — PREGNANCY, URINE: Preg Test, Ur: NEGATIVE

## 2017-06-20 MED ORDER — CARVEDILOL 6.25 MG PO TABS: 6.2500 mg | ORAL_TABLET | Freq: Two times a day (BID) | ORAL | 3 refills | Status: DC

## 2017-06-20 NOTE — Patient Instructions (Addendum)
Medication Instructions:  Start Carvedilol 6.25mg  by mouth twice a day.  Stop Nebivolol  -- If you need a refill on your cardiac medications before your next appointment, please call your pharmacy. --  Labwork: Urine Pregnancy Test  Testing/Procedures: None ordered  Follow-Up:  Hypertension Clinic in 3 months..  Your physician wants you to follow-up in:  6 months with Dr. Saunders Revel.    You will receive a reminder letter in the mail two months in advance. If you don't receive a letter, please call our office to schedule the follow-up appointment.  Thank you for choosing [facility]!!    Any Other Special Instructions Will Be Listed Below (If Applicable).

## 2017-06-20 NOTE — Progress Notes (Signed)
Follow-up Outpatient Visit Date: 06/20/2017  Primary Care Provider: Ladell Pier, MD [place] [id number]  Chief Complaint: Back and leg pain  HPI:  Brittney Contreras is a 42 y.o. year-old female with history of hypertension, LVH, and chronic kidney disease stage IV, who presents for follow-up of hypertension. I last saw her in October, at which time she complained of intermittent fatigue and stable dyspnea on exertion. Her blood pressure was severely elevated in the setting of poor medication compliance due to cost issues. We provided her with samples for nebivolol and [facility] with a PCP there. She has subsequently followed with the hypertension clinic. Blood pressure was noted to be better though still suboptimally controlled at her last visit on 04/18/17.  Today, Brittney Contreras is most concerned about back and leg pain as well as abdominal pain. Back and leg pain is most prominent when she stands for any length of time. Over the last month, she has also had intermittent periumbilical abdominal pain that she reports is sharp/crampy and waxing and waning. It almost always occurs at night when she is lying in bed. It is not related to eating. She denies nausea, vomiting, diarrhea, constipation, and dysuria. Last menstrual period was in early November.  Brittney Contreras has not had any chest pain, shortness of breath, or edema. She is taking all of her medications as prescribed except for nebivolol, which she is unable to afford. She has established care at [facility], where she follows routinely. She has not seen nephrology for over 3 months.  --------------------------------------------------------------------------------------------------  Cardiovascular History & Procedures: Cardiovascular Problems:  Uncontrolled hypertension  Chronic diastolic heart failure and left ventricular  hypertrophy  Risk Factors:  Hypertension, obesity, and sedentary lifestyle  Cath/PCI:  None  CV Surgery:  None  EP Procedures and Devices:  None  Non-Invasive Evaluation(s):  Pharmacologic MPI (01/28/17): Low risk study with increased wall thickness and prominence of the anterolateral wall suggestive of hypertrophy or hypertrophic cardiomyopathy.  LVEF 47%.  TTE (12/06/16): Normal LV size with moderate LVH.  LVEF 55% with normal wall motion.  Grade 2 diastolic dysfunction.  Trivial aortic regurgitation.  Moderate to severe left atrial enlargement.  Normal RV size and function.  Mild right atrial enlargement.  Normal CVP.  Recent CV Pertinent Labs: Lab Results  Component Value Date   CHOL 148 09/11/2016   HDL 57 09/11/2016   LDLCALC 77 09/11/2016   TRIG 69 09/11/2016   CHOLHDL 2.6 09/11/2016   CHOLHDL 3.5 05/03/2016   K 5.1 03/28/2017   MG 1.6 07/26/2012   BUN 39 (H) 03/28/2017   CREATININE 3.41 (H) 03/28/2017   CREATININE 3.18 (H) 05/03/2016    Past medical and surgical history were reviewed and updated in EPIC.  Current Meds  Medication Sig  . amLODipine (NORVASC) 10 MG tablet Take 1 tablet (10 mg total) daily by mouth.  Marland Kitchen atorvastatin (LIPITOR) 20 MG tablet Take 1 tablet (20 mg total) by mouth daily.  . furosemide (LASIX) 40 MG tablet Take 1 tablet (40 mg total) by mouth daily.  . hydrALAZINE (APRESOLINE) 50 MG tablet Take 1 tablet (50 mg total) by mouth 2 (two) times daily.  . nebivolol (BYSTOLIC) 10 MG tablet Take 1 tablet (10 mg total) daily by mouth. Take 1 tablet (5 mg)  by mouth daily for 2 weeks, then increase to 2 tablets (10 mg) daily   Current Facility-Administered  Medications for the 06/20/17 encounter (Office Visit) with Zarion Oliff, Harrell Gave, MD  Medication  . cloNIDine (CATAPRES) tablet 0.2 mg    Allergies: Patient has no known allergies.  Social History   Socioeconomic History  . Marital status: Single    Spouse name: Not on file  . Number of  children: Not on file  . Years of education: Not on file  . Highest education level: Not on file  Social Needs  . Financial resource strain: Not on file  . Food insecurity - worry: Not on file  . Food insecurity - inability: Not on file  . Transportation needs - medical: Not on file  . Transportation needs - non-medical: Not on file  Occupational History  . Not on file  Tobacco Use  . Smoking status: Former Smoker    Packs/day: 0.30    Years: 20.00    Pack years: 6.00    Types: Cigarettes    Last attempt to quit: 07/2016    Years since quitting: 0.9  . Smokeless tobacco: Never Used  . Tobacco comment: pack lasts 4-5 days  Substance and Sexual Activity  . Alcohol use: No    Comment: occ  . Drug use: No    Comment: No hx of IV Use  . Sexual activity: Not on file  Other Topics Concern  . Not on file  Social History Narrative   Lives with her uncle   Was previously a CNA but currently unemployeed          Family History  Problem Relation Age of Onset  . Hypertension Mother   . Heart failure Mother   . Prostate cancer Father   . Kidney disease Unknown        Aunt, deceased, had been on dialysis    Review of Systems: A 12-system review of systems was performed and was negative except as noted in the HPI.  --------------------------------------------------------------------------------------------------  Physical Exam: BP (!) 142/102   Pulse 89   Ht 5\' 3"  (1.6 m)   Wt (!) 311 lb 8 oz (141.3 kg)   SpO2 98%   BMI 55.18 kg/m   General:  Morbidly obese woman, seated comfortably in the exam room. HEENT: No conjunctival pallor or scleral icterus. Moist mucous membranes.  OP clear. Neck: Supple without lymphadenopathy, thyromegaly, JVD, or HJR, though evaluation is limited by body habitus.. Lungs: Mildly diminished breath sounds throughout. Normal work of breathing. Clear to auscultation bilaterally without wheezes or crackles. Heart: Distant heart sounds. Regular  rate and rhythm without murmurs, rubs, or gallops. Unable to assess PMI due to body habitus. Abd: Bowel sounds present. Soft, NT/ND. Unable to assess HSM due to body habitus. Ext: Trace pretibial edema. Radial, PT, and DP pulses are 2+ bilaterally. Skin: Warm and dry without rash.  Lab Results  Component Value Date   WBC 8.7 03/28/2017   HGB 13.4 03/28/2017   HCT 39.5 03/28/2017   MCV 84 03/28/2017   PLT 181 03/28/2017    Lab Results  Component Value Date   NA 146 (H) 03/28/2017   K 5.1 03/28/2017   CL 114 (H) 03/28/2017   CO2 17 (L) 03/28/2017   BUN 39 (H) 03/28/2017   CREATININE 3.41 (H) 03/28/2017   GLUCOSE 76 03/28/2017   ALT 26 03/28/2017    Lab Results  Component Value Date   CHOL 148 09/11/2016   HDL 57 09/11/2016   LDLCALC 77 09/11/2016   TRIG 69 09/11/2016   CHOLHDL 2.6 09/11/2016    --------------------------------------------------------------------------------------------------  ASSESSMENT AND PLAN: Hypertension Blood pressure significantly improved though still modestly elevated. She is tolerating amlodipine and hydralazine well. She is currently not on labetalol due to cost issues. We have agreed to discontinue nebivolol and start carvedilol 6.25 mg twice a day.  Abdominal pain This is new and intermittent over the last month. Given accompanying abdominal pain and menstrual irregularity, we will obtain a pregnancy test today. If the pregnancy test comes back negative, further workup will be deferred to her primary care provider.  Low back and leg pain Most likely muscular skeletal and/or neuropathic. Pedal pulses are reasonable on exam today. If pain persists without clear orthopedic or neurologic cause, we can consider ABIs.  Chronic kidney disease stage IV No recent follow-up with nephrology. I have advised Ms. lipid to contact Dr. Vic Ripper office to be seen as soon as possible.  Follow-up: Return to hypertension clinic in 3 months; return to see me  in 6 months.  Nelva Bush, MD 06/20/2017 10:58 AM

## 2017-06-21 ENCOUNTER — Telehealth: Payer: Self-pay

## 2017-06-21 NOTE — Telephone Encounter (Signed)
Spoke with patient and informed her of Dr. Darnelle Bos recommendations and gave her the results of her urine/pregnancy test.  Patient verbalized understanding.

## 2017-06-21 NOTE — Telephone Encounter (Signed)
LPMTCB 06/21/2017 md

## 2017-08-15 MED FILL — AMLODIPINE BESYLATE 10 MG T: 10 | 30 days supply | Qty: 30 | Fill #0

## 2017-09-18 ENCOUNTER — Ambulatory Visit (INDEPENDENT_AMBULATORY_CARE_PROVIDER_SITE_OTHER): Payer: Self-pay | Admitting: Pharmacist

## 2017-09-18 VITALS — BP 192/140 | HR 90

## 2017-09-18 DIAGNOSIS — I10 Essential (primary) hypertension: Secondary | ICD-10-CM

## 2017-09-18 NOTE — Patient Instructions (Signed)
Call the [facility] and [facility] center as soon as you can - you need to meet with their financial counselor so that you can restart your blood pressure medications as soon as possible  Follow up 2 weeks after you see your primary care doctor

## 2017-09-18 NOTE — Progress Notes (Signed)
Patient ID: Brittney Contreras                 DOB: 03-Jun-1976                      MRN: [id number]     HPI: Brittney Contreras is a 42 y.o. female referred by Dr. Saunders Revel to HTN clinic. PMH is significant for HTN, LVH, CKD IV, HFpEF (EF 55%). Pt has not needed to initiate HD for renal insufficiency and is followed by Dr. Jimmy Footman. BP management has been historically challenging due to medication noncompliance. At HTN clinic visit 02/14/17 pt had been non-compliant to all medications since the end of March 2018 due to cost as she no longer has Medicaid. SBP was >200 mmHg requiring clonidine x1 in clinic. At [facility] with Dr. Saunders Revel 03/22/17, pt was given samples of nebivolol 5mg  daily and referred to [facility] to establish with a PCP there to help with medication costs. Nebivolol remained cost prohibitive and pt was switched to carvedilol 3 months ago. She presents today for follow up.  Patient presents today with her mother. She is seeing PCP with [facility] and qualifies for their $2 and $4 medications however states that she is still unable to afford her medication. She has not been taking any of her blood pressure medications for the past multiple months. She has had headaches for the past few months which we discussed are likely due to her very elevated blood pressure when she is off of her medications.   Current HTN Meds: amlodipine 10mg  daily, carvedilol 6.25mg  BID, Lasix 40mg  daily, hydralazine 50mg  BID - pt has not been taking any of these  Previous HTN meds: lopressor 50mg  BID (changed to Bystolic as samples were available), hydralazine 100mg  BID (restarted at lower dose as pt had remained non-compliant), amlodipine 10 mg daily (restarted at lower dose as pt had remained non-compliant)  BP goal: <130/80 mmHg  Family History: Mother has HTN/HF, Father had prostate cancer, aunt/uncle had kidney disease dependent on dialysis.  Social History: Single with no  children. Former cigarette smoker for 20 years. Never used smokeless tobacco. No current alcohol or drug use. She lives with her uncle.   Diet: Breakfast: fried chicken, Lunch: does not always eat, Dinner: spaghetti, fried chicken, garlic bread. Not too much of a sweet tooth. States healthy diet is also limited due to cost. Drinks water. Pt drinks coffee but "every once in awhile"  Exercise: Loves to walk, right ankle and left knee limit her from activity.  Home BP readings: No blood pressure meter at home.   Wt Readings from Last 3 Encounters:  06/20/17 (!) 311 lb 8 oz (141.3 kg)  03/28/17 (!) 306 lb 12.8 oz (139.2 kg)  03/22/17 286 lb (129.7 kg)   BP Readings from Last 3 Encounters:  06/20/17 (!) 142/102  04/18/17 (!) 152/92  03/28/17 (!) 173/126   Pulse Readings from Last 3 Encounters:  06/20/17 89  04/18/17 71  03/28/17 77    Renal function: CrCl cannot be calculated (Patient's most recent lab result is older than the maximum 21 days allowed.).  Past Medical History:  Diagnosis Date  . Chronic kidney disease   . Diastolic heart failure: Grade 3  07/25/2012  . Hypertension   . Hypertensive hypertrophic cardiomyopathy ([place]) 07/28/2012  . Malignant hypertension with congestive heart failure ([place]) 07/25/2012  . Obesity, morbid ([place]) 07/25/2012  . Obstructive sleep apnea   .  Tobacco abuse 07/27/2012    Current Outpatient Medications on File Prior to Visit  Medication Sig Dispense Refill  . amLODipine (NORVASC) 10 MG tablet Take 1 tablet (10 mg total) daily by mouth. 30 tablet 11  . atorvastatin (LIPITOR) 20 MG tablet Take 1 tablet (20 mg total) by mouth daily. 90 tablet 1  . carvedilol (COREG) 6.25 MG tablet Take 1 tablet (6.25 mg total) by mouth 2 (two) times daily. 180 tablet 3  . furosemide (LASIX) 40 MG tablet Take 1 tablet (40 mg total) by mouth daily. 90 tablet 1  . hydrALAZINE (APRESOLINE) 50 MG tablet Take 1 tablet (50 mg total) by mouth 2 (two) times daily. 60 tablet  6   Current Facility-Administered Medications on File Prior to Visit  Medication Dose Route Frequency Provider Last Rate Last Dose  . cloNIDine (CATAPRES) tablet 0.2 mg  0.2 mg Oral Once End, Christopher, MD        No Known Allergies   Assessment/Plan:  1. Hypertension - BP extremely elevated in clinic today since patient has been out of her BP medications again for the past few months. She is unable to afford her $2 and $4 copays at [facility] however has not followed up with their financial advisor or attempted to sign up for Medicaid this year. Again discussed with patient the risks of stroke, heart, renal and ocular damage with her BP remaining so high and discussed that she needs to be proactive in scheduling an appointment with the financial advisor at [facility]. Unfortunately we are unable to make her medications any cheaper than they currently are unless she applies for additional government assistance. Stressed the importance of making this appt so that pt can resume her BP medications ASAP. She is scheduled to see her PCP in 4 weeks, I have scheduled follow up in HTN clinic in 6 weeks.   Megan E. Supple, PharmD, CPP, [facility] [address] [place], [place] [address] Phone: [phone]; Fax: [phone] 09/18/2017 11:23 AM

## 2017-10-09 ENCOUNTER — Ambulatory Visit: Payer: Self-pay

## 2017-10-21 ENCOUNTER — Ambulatory Visit: Payer: Self-pay | Attending: Internal Medicine | Admitting: Internal Medicine

## 2017-10-21 ENCOUNTER — Encounter: Payer: Self-pay | Admitting: Internal Medicine

## 2017-10-21 DIAGNOSIS — N2581 Secondary hyperparathyroidism of renal origin: Secondary | ICD-10-CM | POA: Insufficient documentation

## 2017-10-21 DIAGNOSIS — I422 Other hypertrophic cardiomyopathy: Secondary | ICD-10-CM | POA: Insufficient documentation

## 2017-10-21 DIAGNOSIS — N184 Chronic kidney disease, stage 4 (severe): Secondary | ICD-10-CM | POA: Insufficient documentation

## 2017-10-21 DIAGNOSIS — Z79899 Other long term (current) drug therapy: Secondary | ICD-10-CM | POA: Insufficient documentation

## 2017-10-21 DIAGNOSIS — Z6841 Body Mass Index (BMI) 40.0 and over, adult: Secondary | ICD-10-CM | POA: Insufficient documentation

## 2017-10-21 DIAGNOSIS — Z8249 Family history of ischemic heart disease and other diseases of the circulatory system: Secondary | ICD-10-CM | POA: Insufficient documentation

## 2017-10-21 DIAGNOSIS — I10 Essential (primary) hypertension: Secondary | ICD-10-CM

## 2017-10-21 DIAGNOSIS — I5032 Chronic diastolic (congestive) heart failure: Secondary | ICD-10-CM

## 2017-10-21 DIAGNOSIS — I130 Hypertensive heart and chronic kidney disease with heart failure and stage 1 through stage 4 chronic kidney disease, or unspecified chronic kidney disease: Secondary | ICD-10-CM | POA: Insufficient documentation

## 2017-10-21 DIAGNOSIS — E039 Hypothyroidism, unspecified: Secondary | ICD-10-CM | POA: Insufficient documentation

## 2017-10-21 DIAGNOSIS — Z87891 Personal history of nicotine dependence: Secondary | ICD-10-CM | POA: Insufficient documentation

## 2017-10-21 DIAGNOSIS — E785 Hyperlipidemia, unspecified: Secondary | ICD-10-CM

## 2017-10-21 DIAGNOSIS — Z9119 Patient's noncompliance with other medical treatment and regimen: Secondary | ICD-10-CM | POA: Insufficient documentation

## 2017-10-21 MED ORDER — AMLODIPINE BESYLATE 10 MG PO TABS: 10.0000 mg | ORAL_TABLET | Freq: Every day | ORAL | 11 refills | Status: DC

## 2017-10-21 MED ORDER — CARVEDILOL 6.25 MG PO TABS: 6.2500 mg | ORAL_TABLET | Freq: Two times a day (BID) | ORAL | 3 refills | Status: DC

## 2017-10-21 MED ORDER — HYDRALAZINE HCL 50 MG PO TABS: 50.0000 mg | ORAL_TABLET | Freq: Two times a day (BID) | ORAL | 6 refills | Status: DC

## 2017-10-21 MED ORDER — ATORVASTATIN CALCIUM 20 MG PO TABS: 20.0000 mg | ORAL_TABLET | Freq: Every day | ORAL | 1 refills | Status: DC

## 2017-10-21 MED ORDER — FUROSEMIDE 40 MG PO TABS: 40.0000 mg | ORAL_TABLET | Freq: Every day | ORAL | 1 refills | Status: DC

## 2017-10-21 MED FILL — AMLODIPINE BESYLATE 10 MG T: 10 | 30 days supply | Qty: 30 | Fill #0

## 2017-10-21 MED FILL — hydrALAZINE HCL 50 MG TABS: 50 | 30 days supply | Qty: 60 | Fill #0

## 2017-10-21 MED FILL — FUROSEMIDE 40 MG TAB: 40 | 30 days supply | Qty: 30 | Fill #0

## 2017-10-21 MED FILL — CARVEDILOL 6.25 MG TABLET: 6.25 | 30 days supply | Qty: 60 | Fill #0

## 2017-10-21 MED FILL — ATORVASTATIN 20 MG TABLET: 20 | 30 days supply | Qty: 30 | Fill #0

## 2017-10-21 NOTE — Progress Notes (Signed)
Patient ID: Brittney Contreras, female    DOB: [birth year]  MRN: [id number]  CC: Hypertension   Subjective: Brittney Contreras is a 42 y.o. female who presents for chronic ds management.  Last seen 03/2018.  Mother is with her. Her concerns today include:  hx HTN, CKD stage 4, chronic dCHF EF 55% 12/2016, former smoker, and hypertrophic CM   Pt did not return for f/u appt in 04/2017 because of lack of transportation.  -Out of all meds since 06/2017 due to lack of finances and "I'm really beginning to feel bad." C/o SOB with minimal exertion, LE edema, constant HA.  + PND and 2 pillow orthopnea. No CP  Patient Active Problem List   Diagnosis Date Noted  . Periumbilical abdominal pain 06/20/2017  . Missed period 06/20/2017  . Bilateral low back pain 06/20/2017  . 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 Former smoker 03/28/2017  . Hyperlipidemia 03/28/2017  . Chronic diastolic heart failure ([place]) 03/23/2017  . Systolic murmur 96/78/9381  . Unspecified hypothyroidism 04/08/2013  . Noncompliance 04/08/2013  . Secondary hyperparathyroidism ([place]) 03/23/2013  . CKD (chronic kidney disease) stage 4, GFR 15-29 ml/min (HCC) 07/30/2012  . Hypertensive hypertrophic cardiomyopathy ([place]) 07/28/2012  . Hypertension 07/25/2012  . Obesity, morbid ([place]) 07/25/2012     No current outpatient medications on file prior to visit.   Current Facility-Administered Medications on File Prior to Visit  Medication Dose Route Frequency Provider Last Rate Last Dose  . cloNIDine (CATAPRES) tablet 0.2 mg  0.2 mg Oral Once End, Harrell Gave, MD        No Known Allergies  Social History   Socioeconomic History  . Marital status: Single    Spouse name: Not on file  . Number of children: Not on file  . Years of education: Not on file  . Highest education level: Not on file  Occupational History  . Not on file  Social Needs  . Financial resource strain: Not on file  . Food insecurity:    Worry: Not on file   Inability: Not on file  . Transportation needs:    Medical: Not on file    Non-medical: Not on file  Tobacco Use  . Smoking status: Former Smoker    Packs/day: 0.30    Years: 20.00    Pack years: 6.00    Types: Cigarettes    Last attempt to quit: 07/2016    Years since quitting: 1.2  . Smokeless tobacco: Never Used  . Tobacco comment: pack lasts 4-5 days  Substance and Sexual Activity  . Alcohol use: No    Comment: occ  . Drug use: No    Comment: No hx of IV Use  . Sexual activity: Not on file  Lifestyle  . Physical activity:    Days per week: Not on file    Minutes per session: Not on file  . Stress: Not on file  Relationships  . Social connections:    Talks on phone: Not on file    Gets together: Not on file    Attends religious service: Not on file    Active member of club or organization: Not on file    Attends meetings of clubs or organizations: Not on file    Relationship status: Not on file  . Intimate partner violence:    Fear of current or ex partner: Not on file    Emotionally abused: Not on file    Physically abused: Not on file    Forced sexual  activity: Not on file  Other Topics Concern  . Not on file  Social History Narrative   Lives with her uncle   Was previously a CNA but currently unemployeed          Family History  Problem Relation Age of Onset  . Hypertension Mother   . Heart failure Mother   . Prostate cancer Father   . Kidney disease Unknown        Aunt, deceased, had been on dialysis    Past Surgical History:  Procedure Laterality Date  . AV FISTULA PLACEMENT Right 08/01/2012   Procedure: ARTERIOVENOUS (AV) FISTULA CREATION;  Surgeon: Mal Misty, MD;  Location: [facility];  Service: Vascular;  Laterality: Right;  Ultrasound guided; Attempted wrist AV Fistula Creation.  . AV FISTULA PLACEMENT Right 07/2012  . LIGATION OF COMPETING BRANCHES OF ARTERIOVENOUS FISTULA Right 11/05/2012   Procedure: LIGATION OF COMPETING BRANCHES OF  ARTERIOVENOUS FISTULA;  Surgeon: Mal Misty, MD;  Location: [facility];  Service: Vascular;  Laterality: Right;  . SHUNTOGRAM Right 12/30/2012   Procedure: FISTULOGRAM;  Surgeon: Serafina Mitchell, MD;  Location: [facility] CATH LAB;  Service: Cardiovascular;  Laterality: Right;    ROS: Review of Systems Neg except as above PHYSICAL EXAM: BP (!) 212/148   Pulse (!) 107   Temp 98.4 F (36.9 C) (Oral)   Resp 16   Wt (!) 313 lb 9.6 oz (142.2 kg)   SpO2 96%   BMI 55.55 kg/m   Repeat BP 220/150 Physical Exam  General appearance - alert, well appearing, obese female and in no distress Mental status - normal mood, behavior, speech, dress, motor activity, and thought processes Chest - clear to auscultation, no wheezes, rales or rhonchi, symmetric air entry Heart - normal rate, regular rhythm, normal S1, S2, no murmurs, rubs, clicks or gallops Extremities - trace to 1 + BL LE edema HD fistula in RUE   ASSESSMENT AND PLAN: 1. Malignant hypertension -advise pt to be seen in ER today.  BP in range for stroke or other CV event.  Pt declined stating her BP has been even higher in past and she does not want to inconvenience her ride.  She may consider going to ED tomorrow.  She plans to stop at pharmacy today to get RFs.   I sent message to case manager to see if there is assistance with getting meds and transportation -pt declines blood draw stating she will be having blood test at [facility] later this wk -f/u in 1 wk with clinical pharmacist for recheck BP if I do not have appt available at that time. - amLODipine (NORVASC) 10 MG tablet; Take 1 tablet (10 mg total) by mouth daily.  Dispense: 30 tablet; Refill: 11 - furosemide (LASIX) 40 MG tablet; Take 1 tablet (40 mg total) by mouth daily.  Dispense: 90 tablet; Refill: 1 - hydrALAZINE (APRESOLINE) 50 MG tablet; Take 1 tablet (50 mg total) by mouth 2 (two) times daily.  Dispense: 60 tablet; Refill: 6 - carvedilol (COREG) 6.25 MG tablet; Take 1 tablet  (6.25 mg total) by mouth 2 (two) times daily.  Dispense: 180 tablet; Refill: 3  2. Hyperlipidemia, unspecified hyperlipidemia type - atorvastatin (LIPITOR) 20 MG tablet; Take 1 tablet (20 mg total) by mouth daily.  Dispense: 90 tablet; Refill: 1  3. Chronic diastolic congestive heart failure (HCC) - furosemide (LASIX) 40 MG tablet; Take 1 tablet (40 mg total) by mouth daily.  Dispense: 90 tablet; Refill: 1  Patient  was given the opportunity to ask questions.  Patient verbalized understanding of the plan and was able to repeat key elements of the plan.   No orders of the defined types were placed in this encounter.    Requested Prescriptions   Signed Prescriptions Disp Refills  . amLODipine (NORVASC) 10 MG tablet 30 tablet 11    Sig: Take 1 tablet (10 mg total) by mouth daily.  Marland Kitchen atorvastatin (LIPITOR) 20 MG tablet 90 tablet 1    Sig: Take 1 tablet (20 mg total) by mouth daily.  . furosemide (LASIX) 40 MG tablet 90 tablet 1    Sig: Take 1 tablet (40 mg total) by mouth daily.  . hydrALAZINE (APRESOLINE) 50 MG tablet 60 tablet 6    Sig: Take 1 tablet (50 mg total) by mouth 2 (two) times daily.  . carvedilol (COREG) 6.25 MG tablet 180 tablet 3    Sig: Take 1 tablet (6.25 mg total) by mouth 2 (two) times daily.    Return in about 1 week (around 10/28/2017).  Karle Plumber, MD, FACP

## 2017-10-29 ENCOUNTER — Ambulatory Visit: Payer: Self-pay | Admitting: Pharmacist

## 2017-10-29 NOTE — Progress Notes (Deleted)
   S:    PCP: Dr. Wynetta Emery  Patient arrives ***.  Presents to the clinic for BP recheck at the request of Dr. Wynetta Emery. Pt was referred on 10/21/17.  Pt is followed by Cardiology and sees a clinical pharmacist for HTN management. Pt has a complex CV history complicated by inability to afford medications and non-compliance.   Patient {Actions; denies-reports:120008} adherence with medications.  Current BP Medications include:  amlodipine 10mg  daily, carvedilol 6.25mg  BID, Lasix 40mg  daily, hydralazine 50mg  BID  Antihypertensives tried in the past include: lopressor 50mg  BIDhydralazine 100mg  BID (restarted at lower dose as pt had remained non-compliant),   Dietary habits include: ***  Exercise habits include: ***  Family / Social history:  -Mother has HTN/HF, aunt/unclehad kidney disease dependent on dialysis -Former cigarette smoker for 20 years. Never used smokeless tobacco. No current alcohol or drug use. She lives with her uncle.  Home BP readings: no blood pressure meter at home   O:  Physical Exam   ROS  Last 3 Office BP readings: BP Readings from Last 3 Encounters:  10/21/17 (!) 212/148  09/18/17 (!) 192/140  06/20/17 (!) 142/102    BMET    Component Value Date/Time   NA 146 (H) 03/28/2017 1220   K 5.1 03/28/2017 1220   CL 114 (H) 03/28/2017 1220   CO2 17 (L) 03/28/2017 1220   GLUCOSE 76 03/28/2017 1220   GLUCOSE 73 05/03/2016 1028   BUN 39 (H) 03/28/2017 1220   CREATININE 3.41 (H) 03/28/2017 1220   CREATININE 3.18 (H) 05/03/2016 1028   CALCIUM 8.2 (L) 03/28/2017 1220   GFRNONAA 16 (L) 03/28/2017 1220   GFRNONAA 17 (L) 05/03/2016 1028   GFRAA 18 (L) 03/28/2017 1220   GFRAA 20 (L) 05/03/2016 1028    Renal function: CrCl cannot be calculated (Patient's most recent lab result is older than the maximum 21 days allowed.).  A/P: Hypertension longstanding currently uncontrolled on current medications. BP Goal <130/80 mmHg. Patient {Is/is not:9024} adherent with  current medications. Pt needs labs but refused lab draw at previous encounter on 10/21/17.  -{Meds adjust:18428} ***.  -Pt needs  -Counseled on lifestyle modifications for blood pressure control including reduced dietary sodium, increased exercise, adequate sleep  Results reviewed and written information provided.   Total time in face-to-face counseling *** minutes.   F/U Clinic Visit in ***.  Patient seen with PharmD Candidates Tyna Jaksch (Class of 2020) and Milus Height (Class of 2022).  Benard Halsted, PharmD [facility] [place]  [phone]

## 2017-11-04 NOTE — Progress Notes (Deleted)
Patient ID: Brittney Contreras                 DOB: 01-Sep-1975                      MRN: [id number]     HPI: Brittney Contreras is a 42 y.o. female patient of Dr. Saunders Revel who presents today for hypertension follow up. PMH significant for HTN, LVH, CKD IV, HFpEF (EF 55%). Pt has not needed to initiate HD for renal insufficiency and is followed by Dr. Jimmy Footman. BP management has been historically challenging due to medication noncompliance. At HTN clinic visit 02/14/17 pt had been non-compliant to all medications since the end of March 2018 due to cost as she no longer has Medicaid. SBP was >200 mmHg requiring clonidine x1 in clinic. At [place] with Dr. Saunders Revel 03/22/17, pt was given samples of nebivolol 5mg  daily and referred to [place] to establish with a PCP there to help with medication costs. Nebivolol remained cost prohibitive and pt was switched to carvedilol. At her most recent OV she was advised to follow up with [facility] advisor to reduce copayments for her medications. At her PCP visit 2 weeks ago her pressure was markedly elevated and she was advised to go to ED. She declined to do so.   She presents today for follow up.   Meet with [facility] therapist at [facility]??  Has been taking medications??  Adherent?   Current HTN Meds: amlodipine 10mg  daily, carvedilol 6.25mg  BID, Lasix 40mg  daily, hydralazine 50mg  BID   Previous HTN meds:lopressor 50mg  BID (changed to Bystolic as samples were available), hydralazine 100mg  BID (restarted at lower dose as pt had remained non-compliant), amlodipine 10 mg daily (restarted at lower dose as pt had remained non-compliant)  BP goal:<130/80 mmHg  Family History:Mother has HTN/HF, Father had prostate cancer, aunt/unclehad kidney disease dependent on dialysis.  Social History:Single with no children. Former cigarette smoker for 20 years. Never used smokeless tobacco. No current alcohol  or drug use. She lives with her uncle.  Diet:Breakfast: fried chicken, Lunch: does not always eat, Dinner: spaghetti, fried chicken, garlic bread. Not too much of a sweet tooth. States healthy diet is also limited due to cost. Drinks water. Pt drinks coffee but "every once in awhile"  Exercise:Loves to walk, right ankle and left knee limit her from activity.  Home BP readings:No blood pressure meter at home.   Wt Readings from Last 3 Encounters:  10/21/17 (!) 313 lb 9.6 oz (142.2 kg)  06/20/17 (!) 311 lb 8 oz (141.3 kg)  03/28/17 (!) 306 lb 12.8 oz (139.2 kg)   BP Readings from Last 3 Encounters:  10/21/17 (!) 212/148  09/18/17 (!) 192/140  06/20/17 (!) 142/102   Pulse Readings from Last 3 Encounters:  10/21/17 (!) 107  09/18/17 90  06/20/17 89    Renal function: CrCl cannot be calculated (Patient's most recent lab result is older than the maximum 21 days allowed.).  Past Medical History:  Diagnosis Date  . Chronic kidney disease   . Diastolic heart failure: Grade 3  07/25/2012  . Hypertension   . Hypertensive hypertrophic cardiomyopathy ([place]) 07/28/2012  . Malignant hypertension with congestive heart failure ([place]) 07/25/2012  . Obesity, morbid ([place]) 07/25/2012  . Obstructive sleep apnea   . Tobacco abuse 07/27/2012    Current Outpatient Medications on File Prior to Visit  Medication Sig Dispense Refill  . amLODipine (  NORVASC) 10 MG tablet Take 1 tablet (10 mg total) by mouth daily. 30 tablet 11  . atorvastatin (LIPITOR) 20 MG tablet Take 1 tablet (20 mg total) by mouth daily. 90 tablet 1  . carvedilol (COREG) 6.25 MG tablet Take 1 tablet (6.25 mg total) by mouth 2 (two) times daily. 180 tablet 3  . furosemide (LASIX) 40 MG tablet Take 1 tablet (40 mg total) by mouth daily. 90 tablet 1  . hydrALAZINE (APRESOLINE) 50 MG tablet Take 1 tablet (50 mg total) by mouth 2 (two) times daily. 60 tablet 6   Current Facility-Administered Medications on File Prior to Visit    Medication Dose Route Frequency Provider Last Rate Last Dose  . cloNIDine (CATAPRES) tablet 0.2 mg  0.2 mg Oral Once End, Christopher, MD        No Known Allergies  There were no vitals taken for this visit.   Assessment/Plan: Hypertension: BP    Thank you, Lelan Pons. Patterson Hammersmith, [facility]  11/04/2017 2:45 PM

## 2017-11-05 ENCOUNTER — Ambulatory Visit: Payer: Self-pay

## 2017-11-11 MED FILL — CALCITRIOL 0.25 MCG CAPS: 0.25 | 30 days supply | Qty: 30 | Fill #0

## 2017-11-21 MED FILL — ATORVASTATIN 20 MG TABLET: 20 | 30 days supply | Qty: 30 | Fill #1

## 2017-11-21 MED FILL — AMLODIPINE BESYLATE 10 MG T: 10 | 30 days supply | Qty: 30 | Fill #1

## 2017-11-21 MED FILL — CARVEDILOL 6.25 MG TABLET: 6.25 | 30 days supply | Qty: 60 | Fill #1

## 2017-11-21 MED FILL — hydrALAZINE HCL 50 MG TABS: 50 | 30 days supply | Qty: 60 | Fill #1

## 2017-11-22 ENCOUNTER — Ambulatory Visit: Payer: Self-pay | Attending: Internal Medicine

## 2017-11-25 MED FILL — FUROSEMIDE 80 MG TABS: 80 | 30 days supply | Qty: 30 | Fill #0

## 2018-02-04 ENCOUNTER — Other Ambulatory Visit: Payer: Self-pay

## 2018-02-04 DIAGNOSIS — N184 Chronic kidney disease, stage 4 (severe): Secondary | ICD-10-CM

## 2018-02-10 ENCOUNTER — Ambulatory Visit (HOSPITAL_COMMUNITY): Payer: Self-pay | Attending: Surgery

## 2018-02-10 ENCOUNTER — Ambulatory Visit (HOSPITAL_COMMUNITY): Payer: Self-pay

## 2018-02-10 ENCOUNTER — Encounter: Payer: Self-pay | Admitting: Surgery

## 2018-04-21 ENCOUNTER — Encounter: Payer: Self-pay | Admitting: Surgery

## 2018-04-21 ENCOUNTER — Other Ambulatory Visit (HOSPITAL_COMMUNITY): Payer: Self-pay

## 2018-04-21 ENCOUNTER — Inpatient Hospital Stay (HOSPITAL_COMMUNITY): Admission: RE | Admit: 2018-04-21 | Payer: Self-pay | Source: Ambulatory Visit

## 2018-04-22 ENCOUNTER — Encounter: Payer: Self-pay | Admitting: Surgery

## 2018-05-07 MED FILL — ATORVASTATIN 20 MG TABLET: 20 | 30 days supply | Qty: 30 | Fill #2

## 2018-05-07 MED FILL — CALCITRIOL 0.25 MCG CAPS: 0.25 | 30 days supply | Qty: 60 | Fill #0

## 2018-05-07 MED FILL — FUROSEMIDE 80 MG TAB: 80 | 30 days supply | Qty: 30 | Fill #1

## 2018-05-07 MED FILL — hydrALAZINE HCL 50 MG TABS: 50 | 30 days supply | Qty: 60 | Fill #2

## 2018-05-07 MED FILL — AMLODIPINE BESYLATE 10 MG T: 10 | 30 days supply | Qty: 30 | Fill #2

## 2018-05-07 MED FILL — CARVEDILOL 6.25 MG TABLET: 6.25 | 30 days supply | Qty: 60 | Fill #2

## 2018-05-07 MED FILL — SODIUM BICARB 650 MG TABLET: 650 | 30 days supply | Qty: 120 | Fill #0

## 2018-06-30 ENCOUNTER — Other Ambulatory Visit (HOSPITAL_COMMUNITY): Payer: Self-pay

## 2018-06-30 ENCOUNTER — Inpatient Hospital Stay (HOSPITAL_COMMUNITY): Admission: RE | Admit: 2018-06-30 | Payer: Self-pay | Source: Ambulatory Visit

## 2018-06-30 ENCOUNTER — Encounter: Payer: Self-pay | Admitting: Surgery

## 2018-07-03 ENCOUNTER — Other Ambulatory Visit: Payer: Self-pay | Admitting: *Deleted

## 2018-07-03 ENCOUNTER — Encounter (HOSPITAL_COMMUNITY): Payer: Self-pay

## 2018-07-03 ENCOUNTER — Ambulatory Visit (HOSPITAL_COMMUNITY)
Admission: RE | Admit: 2018-07-03 | Discharge: 2018-07-03 | Disposition: A | Discharge status: Home / Self Care | Payer: Self-pay | Source: Ambulatory Visit | Attending: Surgery | Admitting: Surgery

## 2018-07-03 ENCOUNTER — Other Ambulatory Visit: Payer: Self-pay

## 2018-07-03 ENCOUNTER — Ambulatory Visit (INDEPENDENT_AMBULATORY_CARE_PROVIDER_SITE_OTHER): Payer: Medicaid Other | Admitting: Vascular Surgery

## 2018-07-03 ENCOUNTER — Encounter: Payer: Self-pay | Admitting: Vascular Surgery

## 2018-07-03 VITALS — BP 192/116 | HR 90 | Temp 97.5°F | Resp 18 | Ht 63.0 in | Wt 302.0 lb

## 2018-07-03 DIAGNOSIS — N184 Chronic kidney disease, stage 4 (severe): Secondary | ICD-10-CM

## 2018-07-03 NOTE — Progress Notes (Signed)
Referring Physician: Dr Deterding  Patient name: Brittney Contreras MRN: [id number] DOB: 10/13/1975 Sex: female  REASON FOR CONSULT: Aneurysmal right arm AV fistula  HPI: Brittney Contreras is a 43 y.o. female, referred for evaluation of an aneurysmal right arm AV fistula.  The fistula was created in 2014.  It has not been used.  She apparently is approaching need for hemodialysis.  She has had no bleeding episodes.  She has no numbness or tingling in her hand.  Other medical problems include malignant hypertension, obesity, sleep apnea, diastolic heart failure.  All of these are currently stable except for her hypertension.  She was severely hypertensive today in our office visit and states she has not been compliant with her medications.  It was strongly emphasized to the patient that she needs to get her medications filled today and if that she cannot she needs to go to the emergency room as her blood pressure was dangerously high.  Past Medical History:  Diagnosis Date  . Chronic kidney disease   . Diastolic heart failure: Grade 3  07/25/2012  . Hypertension   . Hypertensive hypertrophic cardiomyopathy ([place]) 07/28/2012  . Malignant hypertension with congestive heart failure ([place]) 07/25/2012  . Obesity, morbid ([place]) 07/25/2012  . Obstructive sleep apnea   . Tobacco abuse 07/27/2012   Past Surgical History:  Procedure Laterality Date  . AV FISTULA PLACEMENT Right 08/01/2012   Procedure: ARTERIOVENOUS (AV) FISTULA CREATION;  Surgeon: Mal Misty, MD;  Location: [facility];  Service: Vascular;  Laterality: Right;  Ultrasound guided; Attempted wrist AV Fistula Creation.  . AV FISTULA PLACEMENT Right 07/2012  . LIGATION OF COMPETING BRANCHES OF ARTERIOVENOUS FISTULA Right 11/05/2012   Procedure: LIGATION OF COMPETING BRANCHES OF ARTERIOVENOUS FISTULA;  Surgeon: Mal Misty, MD;  Location: [facility];  Service: Vascular;  Laterality: Right;  . SHUNTOGRAM Right 12/30/2012   Procedure: FISTULOGRAM;  Surgeon:  Serafina Mitchell, MD;  Location: [facility] CATH LAB;  Service: Cardiovascular;  Laterality: Right;    Family History  Problem Relation Age of Onset  . Hypertension Mother   . Heart failure Mother   . Prostate cancer Father   . Kidney disease Unknown        Aunt, deceased, had been on dialysis    SOCIAL HISTORY: Social History   Socioeconomic History  . Marital status: Single    Spouse name: Not on file  . Number of children: Not on file  . Years of education: Not on file  . Highest education level: Not on file  Occupational History  . Not on file  Social Needs  . Financial resource strain: Not on file  . Food insecurity:    Worry: Not on file    Inability: Not on file  . Transportation needs:    Medical: Not on file    Non-medical: Not on file  Tobacco Use  . Smoking status: Former Smoker    Packs/day: 0.30    Years: 20.00    Pack years: 6.00    Types: Cigarettes    Last attempt to quit: 07/2016    Years since quitting: 1.9  . Smokeless tobacco: Never Used  . Tobacco comment: pack lasts 4-5 days  Substance and Sexual Activity  . Alcohol use: No    Comment: occ  . Drug use: No    Comment: No hx of IV Use  . Sexual activity: Not on file  Lifestyle  . Physical activity:    Days per week:  Not on file    Minutes per session: Not on file  . Stress: Not on file  Relationships  . Social connections:    Talks on phone: Not on file    Gets together: Not on file    Attends religious service: Not on file    Active member of club or organization: Not on file    Attends meetings of clubs or organizations: Not on file    Relationship status: Not on file  . Intimate partner violence:    Fear of current or ex partner: Not on file    Emotionally abused: Not on file    Physically abused: Not on file    Forced sexual activity: Not on file  Other Topics Concern  . Not on file  Social History Narrative   Lives with her uncle   Was previously a CNA but currently unemployeed            No Known Allergies  Current Outpatient Medications  Medication Sig Dispense Refill  . amLODipine (NORVASC) 10 MG tablet Take 1 tablet (10 mg total) by mouth daily. 30 tablet 11  . atorvastatin (LIPITOR) 20 MG tablet Take 1 tablet (20 mg total) by mouth daily. 90 tablet 1  . carvedilol (COREG) 6.25 MG tablet Take 1 tablet (6.25 mg total) by mouth 2 (two) times daily. 180 tablet 3  . furosemide (LASIX) 40 MG tablet Take 1 tablet (40 mg total) by mouth daily. 90 tablet 1  . hydrALAZINE (APRESOLINE) 50 MG tablet Take 1 tablet (50 mg total) by mouth 2 (two) times daily. (Patient not taking: Reported on 07/03/2018) 60 tablet 6   Current Facility-Administered Medications  Medication Dose Route Frequency Provider Last Rate Last Dose  . cloNIDine (CATAPRES) tablet 0.2 mg  0.2 mg Oral Once End, Christopher, MD        ROS:   General:  No weight loss, Fever, chills  HEENT: No recent headaches, no nasal bleeding, no visual changes, no sore throat  Neurologic: No dizziness, blackouts, seizures. No recent symptoms of stroke or mini- stroke. No recent episodes of slurred speech, or temporary blindness.  Cardiac: No recent episodes of chest pain/pressure, no shortness of breath at rest.  + shortness of breath with exertion.  Denies history of atrial fibrillation or irregular heartbeat  Vascular: No history of rest pain in feet.  No history of claudication.  No history of non-healing ulcer, No history of DVT   Pulmonary: No home oxygen, no productive cough, no hemoptysis,  No asthma or wheezing  Musculoskeletal:  [ ]  Arthritis, [ ]  Low back pain,  [ ]  Joint pain  Hematologic:No history of hypercoagulable state.  No history of easy bleeding.  No history of anemia  Gastrointestinal: No hematochezia or melena,  No gastroesophageal reflux, no trouble swallowing  Urinary: [X]  chronic Kidney disease, [ ]  on HD - [ ]  MWF or [ ]  TTHS, [ ]  Burning with urination, [ ]  Frequent urination, [ ]   Difficulty urinating;   Skin: No rashes  Psychological: No history of anxiety,  No history of depression   Physical Examination  Vitals:   07/03/18 1216  BP: (!) 222/139  Pulse: 90  Resp: 18  Temp: (!) 97.5 F (36.4 C)  TempSrc: Oral  SpO2: 100%  Weight: (!) 302 lb (137 kg)  Height: 5\' 3"  (1.6 m)    Body mass index is 53.5 kg/m.  General:  Alert and oriented, no acute distress HEENT: Normal Pulmonary: Clear to auscultation  bilaterally Cardiac: Regular Rate and Rhythm Abdomen: Soft, obese Skin: No rash Extremity Pulses: Right upper arm AV fistula slightly pulsatile in character approximately 4 cm diameter with tortuosity no thinned out or shiny skin or ulceration upper arm is obese Musculoskeletal: No deformity or edema  Neurologic: Upper and lower extremity motor 5/5 and symmetric  ASSESSMENT: Functioning right upper arm AV fistula currently not at risk for bleeding.  Although it is aneurysmal it should be a functioning fistula.  Due to her obesity she may be a very difficult access patient that I would not abandon or modify this access until she proves that she is having difficulties with it she currently is not using the fistula.  If there are difficulties cannulating the fistula we would be happy to address this in the future.  I do not believe that she is at risk for bleeding currently.  Malignant hypertension discussed with patient she needs to get her blood pressure medication today or go to the emergency room she is at high risk of stroke.   PLAN: See above  Ruta Hinds, MD [facility] Office: [phone] Pager: [phone]

## 2018-09-15 ENCOUNTER — Telehealth: Payer: Self-pay | Admitting: *Deleted

## 2018-09-15 NOTE — Telephone Encounter (Signed)
Call from [place] at [facility]. Patient clotted with treatment today no other HD access. Unable to complete treatment.  Instructed of protocol for clotted access. 1) call [facility], 2) [facility], 3) [facility] surgeon on call. Phone numbers given for contact.

## 2018-09-16 ENCOUNTER — Encounter: Payer: Self-pay | Admitting: *Deleted

## 2018-09-16 ENCOUNTER — Ambulatory Visit (INDEPENDENT_AMBULATORY_CARE_PROVIDER_SITE_OTHER): Payer: Medicaid Other | Admitting: Vascular Surgery

## 2018-09-16 ENCOUNTER — Other Ambulatory Visit: Payer: Self-pay

## 2018-09-16 ENCOUNTER — Telehealth: Payer: Self-pay | Admitting: *Deleted

## 2018-09-16 ENCOUNTER — Encounter: Payer: Self-pay | Admitting: Vascular Surgery

## 2018-09-16 ENCOUNTER — Other Ambulatory Visit: Payer: Self-pay | Admitting: *Deleted

## 2018-09-16 DIAGNOSIS — N186 End stage renal disease: Secondary | ICD-10-CM

## 2018-09-16 NOTE — Telephone Encounter (Signed)
Spoke with Brittney Contreras at [facility]. Plan is for patient to come to [facility] at regular time for HD on 09/17/2018. If they are unable to complete therapy they will contact [facility] for [facility] pending fistulogram and intervention on 09/23/2018. Patient states she does not take her medication because she does not have the money. She was in a hurry to leave because she stated her ride needed to get to work. I told her we were working on getting her contact/ help to get money for meds. " No, I am not going to take them any way". So I let her leave after reviewing instructions for procedure on 4/21(see letter).

## 2018-09-16 NOTE — Progress Notes (Signed)
Patient name: Brittney Contreras MRN: [id number] DOB: Jul 14, 1975 Sex: female  REASON FOR CONSULT: Low flow in aneurysmal right arm AV fistula  HPI: Brittney Contreras is a 43 y.o. female, referred for evaluation of low flow in an aneurysmal right arm AV fistula.  The fistula was created in 2014 by Dr. Kellie Simmering and underwent superficialization and branch ligation in 2014 as well.  It as evaluated by Dr. Oneida Alar several months ago and he recommended no intervention at the time since skin was ok and not using.  Patient states it was just accessed yesterday for the first time.  She states in addition to flow issues they were also pulling some clots out of the fistula.  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  She has also had issues with blood pressure due to noncompliance.  Past Medical History:  Diagnosis Date   Chronic kidney disease    Diastolic heart failure: Grade 3  07/25/2012   Hypertension    Hypertensive hypertrophic cardiomyopathy ([place]) 07/28/2012   Malignant hypertension with congestive heart failure ([place]) 07/25/2012   Obesity, morbid ([place]) 07/25/2012   Obstructive sleep apnea    Tobacco abuse 07/27/2012   Past Surgical History:  Procedure Laterality Date   AV FISTULA PLACEMENT Right 08/01/2012   Procedure: ARTERIOVENOUS (AV) FISTULA CREATION;  Surgeon: Mal Misty, MD;  Location: [facility];  Service: Vascular;  Laterality: Right;  Ultrasound guided; Attempted wrist AV Fistula Creation.   AV FISTULA PLACEMENT Right 07/2012   LIGATION OF COMPETING BRANCHES OF ARTERIOVENOUS FISTULA Right 11/05/2012   Procedure: LIGATION OF COMPETING BRANCHES OF ARTERIOVENOUS FISTULA;  Surgeon: Mal Misty, MD;  Location: [facility];  Service: Vascular;  Laterality: Right;   SHUNTOGRAM Right 12/30/2012   Procedure: FISTULOGRAM;  Surgeon: Serafina Mitchell, MD;  Location[facility] CATH LAB;  Service: Cardiovascular;  Laterality: Right;    Family History  Problem Relation Age of Onset   Hypertension  Mother    Heart failure Mother    Prostate cancer Father    Kidney disease Other        Aunt, deceased, had been on dialysis    SOCIAL HISTORY: Social History   Socioeconomic History   Marital status: Single    Spouse name: Not on file   Number of children: Not on file   Years of education: Not on file   Highest education level: Not on file  Occupational History   Not on file  Social Needs   Financial resource strain: Not on file   Food insecurity:    Worry: Not on file    Inability: Not on file   Transportation needs:    Medical: Not on file    Non-medical: Not on file  Tobacco Use   Smoking status: Former Smoker    Packs/day: 0.30    Years: 20.00    Pack years: 6.00    Types: Cigarettes    Last attempt to quit: 07/2016    Years since quitting: 2.2   Smokeless tobacco: Never Used   Tobacco comment: pack lasts 4-5 days  Substance and Sexual Activity   Alcohol use: No    Comment: occ   Drug use: No    Comment: No hx of IV Use   Sexual activity: Not on file  Lifestyle   Physical activity:    Days per week: Not on file    Minutes per session: Not on file   Stress: Not on file  Relationships   Social connections:    Talks on phone: Not on file    Gets together: Not on file    Attends religious service: Not on file    Active member of club or organization: Not on file    Attends meetings of clubs or organizations: Not on file    Relationship status: Not on file   Intimate partner violence:    Fear of current or ex partner: Not on file    Emotionally abused: Not on file    Physically abused: Not on file    Forced sexual activity: Not on file  Other Topics Concern   Not on file  Social History Narrative   Lives with her uncle   Was previously a CNA but currently unemployeed          No Known Allergies  Current Outpatient Medications  Medication Sig Dispense Refill   amLODipine (NORVASC) 10 MG tablet Take 1 tablet (10 mg total)  by mouth daily. (Patient not taking: Reported on 09/16/2018) 30 tablet 11   atorvastatin (LIPITOR) 20 MG tablet Take 1 tablet (20 mg total) by mouth daily. (Patient not taking: Reported on 09/16/2018) 90 tablet 1   carvedilol (COREG) 6.25 MG tablet Take 1 tablet (6.25 mg total) by mouth 2 (two) times daily. (Patient not taking: Reported on 09/16/2018) 180 tablet 3   furosemide (LASIX) 40 MG tablet Take 1 tablet (40 mg total) by mouth daily. (Patient not taking: Reported on 09/16/2018) 90 tablet 1   hydrALAZINE (APRESOLINE) 50 MG tablet Take 1 tablet (50 mg total) by mouth 2 (two) times daily. (Patient not taking: Reported on 09/16/2018) 60 tablet 6   Current Facility-Administered Medications  Medication Dose Route Frequency Provider Last Rate Last Dose   cloNIDine (CATAPRES) tablet 0.2 mg  0.2 mg Oral Once End, Page Pucciarelli, MD        ROS:   General:  No weight loss, Fever, chills  HEENT: No recent headaches, no nasal bleeding, no visual changes, no sore throat  Neurologic: No dizziness, blackouts, seizures. No recent symptoms of stroke or mini- stroke. No recent episodes of slurred speech, or temporary blindness.  Cardiac: No recent episodes of chest pain/pressure, no shortness of breath at rest.  + shortness of breath with exertion.  Denies history of atrial fibrillation or irregular heartbeat  Vascular: No history of rest pain in feet.  No history of claudication.  No history of non-healing ulcer, No history of DVT   Pulmonary: No home oxygen, no productive cough, no hemoptysis,  No asthma or wheezing  Musculoskeletal:  [ ]  Arthritis, [ ]  Low back pain,  [ ]  Joint pain  Hematologic:No history of hypercoagulable state.  No history of easy bleeding.  No history of anemia  Gastrointestinal: No hematochezia or melena,  No gastroesophageal reflux, no trouble swallowing  Urinary: []  chronic Kidney disease, [ ]  on HD - [ ]  MWF or [ ]  TTHS, [ ]  Burning with urination, [ ]  Frequent urination,  [ ]  Difficulty urinating;   Skin: No rashes  Psychological: No history of anxiety,  No history of depression   Physical Examination  Vitals:   09/16/18 1130  BP: (!) 222/133  Pulse: 90  Resp: 20  Temp: (!) 97.4 F (36.3 C)  SpO2: 97%  Weight: (!) 300 lb 9.6 oz (136.4 kg)  Height: 5\' 3"  (1.6 m)    Body mass index is 53.25 kg/m.  General:  Alert and oriented, no acute distress HEENT: Normal Pulmonary:  Clear to auscultation bilaterally Cardiac: Regular Rate and Rhythm Abdomen: Soft, obese Skin: No rash Extremity Pulses: Right upper arm AV fistula with thrill proximal and more pulsatile in upper arm.  Fistula aneuysmal 4-5 cm in diameter and very tortuouos.  No thin skin or ulcer over fistula. Musculoskeletal: No deformity or edema  Neurologic: Upper and lower extremity motor 5/5 and symmetric  ASSESSMENT/PLAN:  43 year old female with very tortuous and aneurysmal right upper extremity brachiocephalic fistula in an obese arm.  Per report from the dialysis center there were issues with low flow yesterday as well as some clots being pulled out of the fistula.  On my evaluation there is a thrill proximally near the antecubitum and it is more pulsatile in character in the upper arm.  I have recommended a fistulogram on a nondialysis day to further evaluate.  I discussed there is a high liklihood she is going to equire open surgical revision, but I think a fistulogram should be done first to evaluate options.  We called her dialysis center and she is scheduled for dialysis again tomorrow and if they have any difficulty accessing the fistula they will get a catheter placed by [facility] given that the next available spot for fistulogram on non-dialysis day is next Tuesday.   Marty Heck, MD [facility] [place] Office: [phone] Pager: [place]

## 2018-09-19 ENCOUNTER — Ambulatory Visit (HOSPITAL_COMMUNITY)
Admission: RE | Admit: 2018-09-19 | Discharge: 2018-09-19 | Disposition: A | Discharge status: Home / Self Care | Payer: Medicaid Other | Source: Other Acute Inpatient Hospital | Attending: Vascular Surgery | Admitting: Vascular Surgery

## 2018-09-19 ENCOUNTER — Ambulatory Visit (HOSPITAL_COMMUNITY): Payer: Medicaid Other | Admitting: Certified Registered Nurse Anesthetist

## 2018-09-19 ENCOUNTER — Encounter (HOSPITAL_COMMUNITY): Payer: Self-pay | Admitting: *Deleted

## 2018-09-19 ENCOUNTER — Encounter (HOSPITAL_COMMUNITY): Admission: RE | Disposition: A | Discharge status: Home / Self Care | Payer: Self-pay | Attending: Vascular Surgery

## 2018-09-19 ENCOUNTER — Ambulatory Visit (HOSPITAL_COMMUNITY): Payer: Medicaid Other

## 2018-09-19 ENCOUNTER — Other Ambulatory Visit: Payer: Self-pay

## 2018-09-19 DIAGNOSIS — G4733 Obstructive sleep apnea (adult) (pediatric): Secondary | ICD-10-CM | POA: Insufficient documentation

## 2018-09-19 DIAGNOSIS — Z79899 Other long term (current) drug therapy: Secondary | ICD-10-CM | POA: Diagnosis not present

## 2018-09-19 DIAGNOSIS — Z9119 Patient's noncompliance with other medical treatment and regimen: Secondary | ICD-10-CM | POA: Insufficient documentation

## 2018-09-19 DIAGNOSIS — N186 End stage renal disease: Secondary | ICD-10-CM | POA: Insufficient documentation

## 2018-09-19 DIAGNOSIS — I132 Hypertensive heart and chronic kidney disease with heart failure and with stage 5 chronic kidney disease, or end stage renal disease: Secondary | ICD-10-CM | POA: Insufficient documentation

## 2018-09-19 DIAGNOSIS — I5032 Chronic diastolic (congestive) heart failure: Secondary | ICD-10-CM | POA: Diagnosis not present

## 2018-09-19 DIAGNOSIS — Z87891 Personal history of nicotine dependence: Secondary | ICD-10-CM | POA: Diagnosis not present

## 2018-09-19 DIAGNOSIS — Y841 Kidney dialysis as the cause of abnormal reaction of the patient, or of later complication, without mention of misadventure at the time of the procedure: Secondary | ICD-10-CM | POA: Diagnosis not present

## 2018-09-19 DIAGNOSIS — I422 Other hypertrophic cardiomyopathy: Secondary | ICD-10-CM | POA: Insufficient documentation

## 2018-09-19 DIAGNOSIS — Z6841 Body Mass Index (BMI) 40.0 and over, adult: Secondary | ICD-10-CM | POA: Insufficient documentation

## 2018-09-19 DIAGNOSIS — Z992 Dependence on renal dialysis: Secondary | ICD-10-CM | POA: Insufficient documentation

## 2018-09-19 DIAGNOSIS — T82898A Other specified complication of vascular prosthetic devices, implants and grafts, initial encounter: Secondary | ICD-10-CM | POA: Diagnosis not present

## 2018-09-19 DIAGNOSIS — T82868A Thrombosis of vascular prosthetic devices, implants and grafts, initial encounter: Secondary | ICD-10-CM | POA: Diagnosis not present

## 2018-09-19 DIAGNOSIS — Z95828 Presence of other vascular implants and grafts: Secondary | ICD-10-CM

## 2018-09-19 DIAGNOSIS — Z419 Encounter for procedure for purposes other than remedying health state, unspecified: Secondary | ICD-10-CM

## 2018-09-19 DIAGNOSIS — Z452 Encounter for adjustment and management of vascular access device: Secondary | ICD-10-CM

## 2018-09-19 HISTORY — PX: INSERTION OF DIALYSIS CATHETER: SHX1324

## 2018-09-19 LAB — CBC
HCT: 29.8 % — ABNORMAL LOW (ref 36.0–46.0)
Hemoglobin: 9.4 g/dL — ABNORMAL LOW (ref 12.0–15.0)
MCH: 28.1 pg (ref 26.0–34.0)
MCHC: 31.5 g/dL (ref 30.0–36.0)
MCV: 89.2 fL (ref 80.0–100.0)
Platelets: 102 10*3/uL — ABNORMAL LOW (ref 150–400)
RBC: 3.34 MIL/uL — ABNORMAL LOW (ref 3.87–5.11)
RDW: 17.5 % — ABNORMAL HIGH (ref 11.5–15.5)
WBC: 6.7 10*3/uL (ref 4.0–10.5)
nRBC: 0 % (ref 0.0–0.2)

## 2018-09-19 LAB — BASIC METABOLIC PANEL
Anion gap: 12 (ref 5–15)
BUN: 33 mg/dL — ABNORMAL HIGH (ref 6–20)
CO2: 23 mmol/L (ref 22–32)
Calcium: 7.9 mg/dL — ABNORMAL LOW (ref 8.9–10.3)
Chloride: 110 mmol/L (ref 98–111)
Creatinine, Ser: 6.11 mg/dL — ABNORMAL HIGH (ref 0.44–1.00)
GFR calc Af Amer: 9 mL/min — ABNORMAL LOW (ref >60)
GFR calc non Af Amer: 8 mL/min — ABNORMAL LOW (ref >60)
Glucose, Bld: 97 mg/dL (ref 70–99)
Potassium: 3.4 mmol/L — ABNORMAL LOW (ref 3.5–5.1)
Sodium: 145 mmol/L (ref 135–145)

## 2018-09-19 SURGERY — INSERTION OF DIALYSIS CATHETER
Anesthesia: Monitor Anesthesia Care | Laterality: Right

## 2018-09-19 MED ORDER — SODIUM CHLORIDE 0.9% FLUSH: 3.0000 mL | INTRAVENOUS | Status: DC | PRN

## 2018-09-19 MED ORDER — CARVEDILOL 3.125 MG PO TABS
ORAL_TABLET | ORAL | Status: AC
  Filled 2018-09-19: qty 2

## 2018-09-19 MED ORDER — PHENYLEPHRINE 40 MCG/ML (10ML) SYRINGE FOR IV PUSH (FOR BLOOD PRESSURE SUPPORT)
PREFILLED_SYRINGE | INTRAVENOUS | Status: AC
  Filled 2018-09-19: qty 30

## 2018-09-19 MED ORDER — HYDRALAZINE HCL 20 MG/ML IJ SOLN
10.0000 mg | Freq: Once | INTRAMUSCULAR | Status: AC
  Administered 2018-09-19: 10 mg via INTRAVENOUS

## 2018-09-19 MED ORDER — SODIUM CHLORIDE 0.9 % IV SOLN
INTRAVENOUS | Status: DC
  Administered 2018-09-19: 15:00:00 via INTRAVENOUS

## 2018-09-19 MED ORDER — CEFAZOLIN SODIUM 1 G IJ SOLR
INTRAMUSCULAR | Status: AC
  Filled 2018-09-19: qty 30

## 2018-09-19 MED ORDER — 0.9 % SODIUM CHLORIDE (POUR BTL) OPTIME
TOPICAL | Status: DC | PRN
  Administered 2018-09-19: 18:00:00 1000 mL

## 2018-09-19 MED ORDER — LIDOCAINE 2% (20 MG/ML) 5 ML SYRINGE
INTRAMUSCULAR | Status: AC
  Filled 2018-09-19: qty 5

## 2018-09-19 MED ORDER — HEPARIN SODIUM (PORCINE) 1000 UNIT/ML IJ SOLN
INTRAMUSCULAR | Status: DC | PRN
  Administered 2018-09-19: 1000 [IU] via INTRAVENOUS

## 2018-09-19 MED ORDER — CARVEDILOL 6.25 MG PO TABS
6.2500 mg | ORAL_TABLET | Freq: Two times a day (BID) | ORAL | Status: AC
  Administered 2018-09-19: 6.25 mg via ORAL
  Filled 2018-09-19: qty 1

## 2018-09-19 MED ORDER — HEPARIN SODIUM (PORCINE) 1000 UNIT/ML IJ SOLN
INTRAMUSCULAR | Status: AC
  Filled 2018-09-19: qty 1

## 2018-09-19 MED ORDER — MIDAZOLAM HCL 2 MG/2ML IJ SOLN
INTRAMUSCULAR | Status: AC
  Filled 2018-09-19: qty 2

## 2018-09-19 MED ORDER — MIDAZOLAM HCL 5 MG/5ML IJ SOLN
INTRAMUSCULAR | Status: DC | PRN
  Administered 2018-09-19: 2 mg via INTRAVENOUS

## 2018-09-19 MED ORDER — HYDRALAZINE HCL 50 MG PO TABS
50.0000 mg | ORAL_TABLET | ORAL | Status: AC
  Administered 2018-09-19: 50 mg via ORAL
  Filled 2018-09-19: qty 1

## 2018-09-19 MED ORDER — HYDRALAZINE HCL 20 MG/ML IJ SOLN
INTRAMUSCULAR | Status: AC
  Filled 2018-09-19: qty 1

## 2018-09-19 MED ORDER — LIDOCAINE-EPINEPHRINE (PF) 1 %-1:200000 IJ SOLN
INTRAMUSCULAR | Status: AC
  Filled 2018-09-19: qty 30

## 2018-09-19 MED ORDER — LIDOCAINE 2% (20 MG/ML) 5 ML SYRINGE
INTRAMUSCULAR | Status: DC | PRN
  Administered 2018-09-19: 80 mg via INTRAVENOUS

## 2018-09-19 MED ORDER — FENTANYL CITRATE (PF) 250 MCG/5ML IJ SOLN
INTRAMUSCULAR | Status: AC
  Filled 2018-09-19: qty 5

## 2018-09-19 MED ORDER — SODIUM CHLORIDE 0.9 % IV SOLN
INTRAVENOUS | Status: DC | PRN
  Administered 2018-09-19: 18:00:00

## 2018-09-19 MED ORDER — LIDOCAINE-EPINEPHRINE (PF) 1 %-1:200000 IJ SOLN
INTRAMUSCULAR | Status: DC | PRN
  Administered 2018-09-19: 30 mL

## 2018-09-19 MED ORDER — FENTANYL CITRATE (PF) 250 MCG/5ML IJ SOLN
INTRAMUSCULAR | Status: DC | PRN
  Administered 2018-09-19: 50 ug via INTRAVENOUS

## 2018-09-19 MED ORDER — PROPOFOL 10 MG/ML IV BOLUS
INTRAVENOUS | Status: AC
  Filled 2018-09-19: qty 20

## 2018-09-19 MED ORDER — PROPOFOL 500 MG/50ML IV EMUL
INTRAVENOUS | Status: DC | PRN
  Administered 2018-09-19: 75 ug/kg/min via INTRAVENOUS

## 2018-09-19 MED ORDER — SUCCINYLCHOLINE CHLORIDE 200 MG/10ML IV SOSY
PREFILLED_SYRINGE | INTRAVENOUS | Status: AC
  Filled 2018-09-19: qty 10

## 2018-09-19 MED ORDER — CEFAZOLIN SODIUM-DEXTROSE 1-4 GM/50ML-% IV SOLN
INTRAVENOUS | Status: DC | PRN
  Administered 2018-09-19: 3 g via INTRAVENOUS

## 2018-09-19 SURGICAL SUPPLY — 42 items
ADH SKN CLS APL DERMABOND .7 (GAUZE/BANDAGES/DRESSINGS) ×1
BAG DECANTER FOR FLEXI CONT (MISCELLANEOUS) ×3 IMPLANT
BIOPATCH RED 1 DISK 7.0 (GAUZE/BANDAGES/DRESSINGS) ×2 IMPLANT
BIOPATCH RED 1IN DISK 7.0MM (GAUZE/BANDAGES/DRESSINGS) ×1
CATH PALINDROME RT-P 15FX19CM (CATHETERS) ×2 IMPLANT
CATH PALINDROME RT-P 15FX23CM (CATHETERS) IMPLANT
CATH PALINDROME RT-P 15FX28CM (CATHETERS) IMPLANT
CATH PALINDROME RT-P 15FX55CM (CATHETERS) IMPLANT
COVER PROBE W GEL 5X96 (DRAPES) ×3 IMPLANT
COVER SURGICAL LIGHT HANDLE (MISCELLANEOUS) ×3 IMPLANT
COVER WAND RF STERILE (DRAPES) ×3 IMPLANT
DERMABOND ADVANCED (GAUZE/BANDAGES/DRESSINGS) ×2
DERMABOND ADVANCED .7 DNX12 (GAUZE/BANDAGES/DRESSINGS) ×1 IMPLANT
DRAPE C-ARM 42X72 X-RAY (DRAPES) ×3 IMPLANT
DRAPE CHEST BREAST 15X10 FENES (DRAPES) ×3 IMPLANT
GAUZE 4X4 16PLY RFD (DISPOSABLE) ×3 IMPLANT
GLOVE BIO SURGEON STRL SZ7.5 (GLOVE) ×3 IMPLANT
GOWN STRL REUS W/ TWL LRG LVL3 (GOWN DISPOSABLE) ×1 IMPLANT
GOWN STRL REUS W/ TWL XL LVL3 (GOWN DISPOSABLE) ×1 IMPLANT
GOWN STRL REUS W/TWL LRG LVL3 (GOWN DISPOSABLE) ×3
GOWN STRL REUS W/TWL XL LVL3 (GOWN DISPOSABLE) ×3
KIT BASIN OR (CUSTOM PROCEDURE TRAY) ×3 IMPLANT
KIT TURNOVER KIT B (KITS) ×3 IMPLANT
NDL 18GX1X1/2 (RX/OR ONLY) (NEEDLE) ×1 IMPLANT
NDL HYPO 25GX1X1/2 BEV (NEEDLE) ×1 IMPLANT
NEEDLE 18GX1X1/2 (RX/OR ONLY) (NEEDLE) ×3 IMPLANT
NEEDLE HYPO 25GX1X1/2 BEV (NEEDLE) ×3 IMPLANT
NS IRRIG 1000ML POUR BTL (IV SOLUTION) ×3 IMPLANT
PACK SURGICAL SETUP 50X90 (CUSTOM PROCEDURE TRAY) ×3 IMPLANT
PAD ARMBOARD 7.5X6 YLW CONV (MISCELLANEOUS) ×6 IMPLANT
SET MICROPUNCTURE 5F STIFF (MISCELLANEOUS) ×2 IMPLANT
SOAP 2 % CHG 4 OZ (WOUND CARE) ×3 IMPLANT
SUT ETHILON 3 0 PS 1 (SUTURE) ×3 IMPLANT
SUT MNCRL AB 4-0 PS2 18 (SUTURE) ×3 IMPLANT
SYR 10ML LL (SYRINGE) ×3 IMPLANT
SYR 20CC LL (SYRINGE) ×6 IMPLANT
SYR 5ML LL (SYRINGE) ×2 IMPLANT
SYR CONTROL 10ML LL (SYRINGE) ×3 IMPLANT
TOWEL GREEN STERILE (TOWEL DISPOSABLE) ×3 IMPLANT
TOWEL GREEN STERILE FF (TOWEL DISPOSABLE) ×3 IMPLANT
TOWEL OR 17X26 4PK STRL BLUE (TOWEL DISPOSABLE) ×3 IMPLANT
WATER STERILE IRR 1000ML POUR (IV SOLUTION) ×3 IMPLANT

## 2018-09-19 NOTE — Transfer of Care (Signed)
Immediate Anesthesia Transfer of Care Note  Patient: Brittney Contreras  Procedure(s) Performed: INSERTION OF DIALYSIS CATHETER (Right )  Patient Location: PACU  Anesthesia Type:MAC  Level of Consciousness: awake, alert , oriented and patient cooperative  Airway & Oxygen Therapy: Patient Spontanous Breathing and Patient connected to nasal cannula oxygen  Post-op Assessment: Report given to RN, Post -op Vital signs reviewed and stable and Patient moving all extremities  Post vital signs: Reviewed and stable  Last Vitals:  Vitals Value Taken Time  BP    Temp    Pulse 96 09/19/2018  7:16 PM  Resp 19 09/19/2018  7:16 PM  SpO2 94 % 09/19/2018  7:16 PM  Vitals shown include unvalidated device data.  Last Pain:  Vitals:   09/19/18 1443  TempSrc: Oral  PainSc: 0-No pain      Patients Stated Pain Goal: 0 (63/33/54 5625)  Complications: No apparent anesthesia complications

## 2018-09-19 NOTE — Progress Notes (Signed)
Spoke with dr Kalman Shan re bp' 170-200's/100's, orders received

## 2018-09-19 NOTE — Op Note (Signed)
    Patient name: Brittney Contreras MRN: [id number] DOB: Mar 17, 1976 Sex: female  09/19/2018 Pre-operative Diagnosis: End-stage renal disease, infiltration right arm AV fistula Post-operative diagnosis:  Same Surgeon:  Eda Paschal. Donzetta Matters, MD Procedure Performed:  Placement of 19 cm tunneled dialysis catheter in the right internal jugular vein with ultrasound guidance  Indications: 43 year old female with end-stage renal disease on dialysis via right arm fistula that was recently infiltrated.  She was unable to get dialysis today and required placement of tunneled catheter.  Findings: Right IJ was patent and compressible.  Catheter terminating at the [facility] atrial junction.   Procedure:  The patient was identified in the holding area and taken to the operating room where she is placed upon operative when MAC anesthesia induced.  She was sterilely prepped draped the bilateral neck and chest area given antibiotics and a timeout was called.  We began using ultrasound to identify the right IJ.  The area was excised 1% lidocaine with epinephrine.  IJ was cannulated micropuncture needle followed by wire and sheath.  I placed a J-wire and serially dilated this tract and placed introducer sheath.  A counterincision was made and a 19 cm catheter was tunneled trimmed to size and assembled.  He was placed in position under fluoroscopic guidance.  Was flushed with heparinized saline locked with concentrated heparin 1.5 cc in either port.  Was affixed the skin with 3-0 nylon suture.  The neck incision was closed with 4 Monocryl.  Dermabond is placed to both sides.  Sterile dressing was applied.  She tolerated procedure without immediate complication.  EBL: 10cc    Shalece Staffa C. Donzetta Matters, MD [facility] [place] [place] Office: [phone] Pager: [phone]

## 2018-09-19 NOTE — Anesthesia Preprocedure Evaluation (Addendum)
Anesthesia Evaluation  Patient identified by MRN, date of birth, ID band Patient awake    Reviewed: Allergy & Precautions, NPO status , Patient's Chart, lab work & pertinent test results  Airway Mallampati: II  TM Distance: >3 FB Neck ROM: Full    Dental no notable dental hx.    Pulmonary sleep apnea , former smoker,    Pulmonary exam normal breath sounds clear to auscultation       Cardiovascular hypertension, Normal cardiovascular exam Rhythm:Regular Rate:Normal  Left ventricle: The cavity size was normal. Wall thickness was   increased in a pattern of moderate LVH. The estimated ejection   fraction was 55%. Wall motion was normal; there were no regional   wall motion abnormalities. Features are consistent with a   pseudonormal left ventricular filling pattern, with concomitant   abnormal relaxation and increased filling pressure (grade 2   diastolic dysfunction). - Aortic valve: There was no stenosis. There was trivial   regurgitation. - Mitral valve: There was trivial regurgitation. - Left atrium: The atrium was moderately to severely dilated. - Right ventricle: The cavity size was normal. Systolic function   was normal. - Right atrium: The atrium was mildly dilated. - Pulmonary arteries: No complete TR doppler jet so unable to   estimate PA systolic pressure. - Inferior vena cava: The vessel was normal in size. The   respirophasic diameter changes were in the normal range (>= 50%),   consistent with normal central venous pressure.  Impressions:  - Normal LV size with moderate LV hypertrophy, EF 55%. Moderate   diastolic dysfunction. Normal RV size and systolic function.   Moderate to severe left atrial enlargement. 12/2016   Neuro/Psych negative neurological ROS  negative psych ROS   GI/Hepatic negative GI ROS, Neg liver ROS,   Endo/Other  Hypothyroidism Morbid obesity  Renal/GU ESRFRenal disease  negative  genitourinary   Musculoskeletal negative musculoskeletal ROS (+)   Abdominal (+) + obese,   Peds negative pediatric ROS (+)  Hematology  (+) anemia ,   Anesthesia Other Findings   Reproductive/Obstetrics negative OB ROS                            Anesthesia Physical Anesthesia Plan  ASA: IV  Anesthesia Plan: MAC   Post-op Pain Management:    Induction: Intravenous  PONV Risk Score and Plan: 1 and Ondansetron  Airway Management Planned: Simple Face Mask  Additional Equipment:   Intra-op Plan:   Post-operative Plan:   Informed Consent: I have reviewed the patients History and Physical, chart, labs and discussed the procedure including the risks, benefits and alternatives for the proposed anesthesia with the patient or authorized representative who has indicated his/her understanding and acceptance.     Dental advisory given  Plan Discussed with: CRNA and Surgeon  Anesthesia Plan Comments:         Anesthesia Quick Evaluation

## 2018-09-19 NOTE — H&P (Signed)
History and Physical Interval Note:  09/19/2018 3:27 PM  Brittney Contreras  has presented today for surgery, with the diagnosis of end stage renal disease.  The various methods of treatment have been discussed with the patient and family. After consideration of risks, benefits and other options for treatment, the patient has consented to  Procedure(s): INSERTION OF DIALYSIS CATHETER (N/A) as a surgical intervention.  The patient's history has been reviewed, patient examined, no change in status, stable for surgery.  I have reviewed the patient's chart and labs.  Questions were answered to the patient's satisfaction.    Plan for tunneled dialysis catheter.  RUE AVF now infiltrated in dialysis with large arm hematoma since evaluation in clinic on Tuesday.  Patient ate chicken wings at 1030, have to wait till 630 per anesthesia.  Will need to rest right arm AVF in interim.  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 female, referred for evaluation of low flow in an aneurysmal right arm AV fistula.  The fistula was created in 2014 by Dr. Kellie Simmering and underwent superficialization and branch ligation in 2014 as well.  It as evaluated by Dr. Oneida Alar several months ago and he recommended no intervention at the time since skin was ok and not using.  Patient states it was just accessed yesterday for the first time.  She states in addition to flow issues they were also pulling some clots out of the fistula.  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  She has also had issues with blood pressure due to noncompliance.      Past Medical History:  Diagnosis Date  . Chronic kidney disease   . Diastolic heart failure: Grade 3  07/25/2012  . Hypertension   . Hypertensive hypertrophic cardiomyopathy ([place]) 07/28/2012  . Malignant hypertension  with congestive heart failure ([place]) 07/25/2012  . Obesity, morbid ([place]) 07/25/2012  . Obstructive sleep apnea   . Tobacco abuse 07/27/2012        Past Surgical History:  Procedure Laterality Date  . AV FISTULA PLACEMENT Right 08/01/2012   Procedure: ARTERIOVENOUS (AV) FISTULA CREATION;  Surgeon: Mal Misty, MD;  Location: [place];  Service: Vascular;  Laterality: Right;  Ultrasound guided; Attempted wrist AV Fistula Creation.  . AV FISTULA PLACEMENT Right 07/2012  . LIGATION OF COMPETING BRANCHES OF ARTERIOVENOUS FISTULA Right 11/05/2012   Procedure: LIGATION OF COMPETING BRANCHES OF ARTERIOVENOUS FISTULA;  Surgeon: Mal Misty, MD;  Location: [place];  Service: Vascular;  Laterality: Right;  . SHUNTOGRAM Right 12/30/2012   Procedure: FISTULOGRAM;  Surgeon: Serafina Mitchell, MD;  Location: [facility] CATH LAB;  Service: Cardiovascular;  Laterality: Right;         Family History  Problem Relation Age of Onset  . Hypertension Mother   . Heart failure Mother   . Prostate cancer Father   . Kidney disease Other        Aunt, deceased, had been on dialysis    SOCIAL HISTORY: Social History        Socioeconomic History  . Marital status: Single    Spouse name: Not on file  . Number of children: Not on file  . Years of education: Not on file  . Highest  education level: Not on file  Occupational History  . Not on file  Social Needs  . Financial resource strain: Not on file  . Food insecurity:    Worry: Not on file    Inability: Not on file  . Transportation needs:    Medical: Not on file    Non-medical: Not on file  Tobacco Use  . Smoking status: Former Smoker    Packs/day: 0.30    Years: 20.00    Pack years: 6.00    Types: Cigarettes    Last attempt to quit: 07/2016    Years since quitting: 2.2  . Smokeless tobacco: Never Used  . Tobacco comment: pack lasts 4-5 days  Substance and Sexual Activity  . Alcohol use: No    Comment: occ  . Drug  use: No    Comment: No hx of IV Use  . Sexual activity: Not on file  Lifestyle  . Physical activity:    Days per week: Not on file    Minutes per session: Not on file  . Stress: Not on file  Relationships  . Social connections:    Talks on phone: Not on file    Gets together: Not on file    Attends religious service: Not on file    Active member of club or organization: Not on file    Attends meetings of clubs or organizations: Not on file    Relationship status: Not on file  . Intimate partner violence:    Fear of current or ex partner: Not on file    Emotionally abused: Not on file    Physically abused: Not on file    Forced sexual activity: Not on file  Other Topics Concern  . Not on file  Social History Narrative   Lives with her uncle   Was previously a CNA but currently unemployeed          No Known Allergies        Current Outpatient Medications  Medication Sig Dispense Refill  . amLODipine (NORVASC) 10 MG tablet Take 1 tablet (10 mg total) by mouth daily. (Patient not taking: Reported on 09/16/2018) 30 tablet 11  . atorvastatin (LIPITOR) 20 MG tablet Take 1 tablet (20 mg total) by mouth daily. (Patient not taking: Reported on 09/16/2018) 90 tablet 1  . carvedilol (COREG) 6.25 MG tablet Take 1 tablet (6.25 mg total) by mouth 2 (two) times daily. (Patient not taking: Reported on 09/16/2018) 180 tablet 3  . furosemide (LASIX) 40 MG tablet Take 1 tablet (40 mg total) by mouth daily. (Patient not taking: Reported on 09/16/2018) 90 tablet 1  . hydrALAZINE (APRESOLINE) 50 MG tablet Take 1 tablet (50 mg total) by mouth 2 (two) times daily. (Patient not taking: Reported on 09/16/2018) 60 tablet 6            Current Facility-Administered Medications  Medication Dose Route Frequency Provider Last Rate Last Dose  . cloNIDine (CATAPRES) tablet 0.2 mg  0.2 mg Oral Once End, Tanvi Gatling, MD        ROS:   General:  No weight loss, Fever,  chills  HEENT: No recent headaches, no nasal bleeding, no visual changes, no sore throat  Neurologic: No dizziness, blackouts, seizures. No recent symptoms of stroke or mini- stroke. No recent episodes of slurred speech, or temporary blindness.  Cardiac: No recent episodes of chest pain/pressure, no shortness of breath at rest.  + shortness of breath with exertion.  Denies history of atrial fibrillation or  irregular heartbeat  Vascular: No history of rest pain in feet.  No history of claudication.  No history of non-healing ulcer, No history of DVT   Pulmonary: No home oxygen, no productive cough, no hemoptysis,  No asthma or wheezing  Musculoskeletal:  [ ]  Arthritis, [ ]  Low back pain,  [ ]  Joint pain  Hematologic:No history of hypercoagulable state.  No history of easy bleeding.  No history of anemia  Gastrointestinal: No hematochezia or melena,  No gastroesophageal reflux, no trouble swallowing  Urinary: []  chronic Kidney disease, [ ]  on HD - [ ]  MWF or [ ]  TTHS, [ ]  Burning with urination, [ ]  Frequent urination, [ ]  Difficulty urinating;   Skin: No rashes  Psychological: No history of anxiety,  No history of depression   Physical Examination     Vitals:   09/16/18 1130  BP: (!) 222/133  Pulse: 90  Resp: 20  Temp: (!) 97.4 F (36.3 C)  SpO2: 97%  Weight: (!) 300 lb 9.6 oz (136.4 kg)  Height: 5\' 3"  (1.6 m)    Body mass index is 53.25 kg/m.  General:  Alert and oriented, no acute distress HEENT: Normal Pulmonary: Clear to auscultation bilaterally Cardiac: Regular Rate and Rhythm Abdomen: Soft, obese Skin: No rash Extremity Pulses: Right upper arm AV fistula with thrill proximal and more pulsatile in upper arm.  Fistula aneuysmal 4-5 cm in diameter and very tortuouos.  No thin skin or ulcer over fistula. Musculoskeletal: No deformity or edema      Neurologic: Upper and lower extremity motor 5/5 and symmetric  ASSESSMENT/PLAN:  43 year old  female with very tortuous and aneurysmal right upper extremity brachiocephalic fistula in an obese arm.  Per report from the dialysis center there were issues with low flow yesterday as well as some clots being pulled out of the fistula.  On my evaluation there is a thrill proximally near the antecubitum and it is more pulsatile in character in the upper arm.  I have recommended a fistulogram on a nondialysis day to further evaluate.  I discussed there is a high liklihood she is going to equire open surgical revision, but I think a fistulogram should be done first to evaluate options.  We called her dialysis center and she is scheduled for dialysis again tomorrow and if they have any difficulty accessing the fistula they will get a catheter placed by [facility] given that the next available spot for fistulogram on non-dialysis day is next Tuesday.   Marty Heck, MD [facility] Office: [phone] Pager: [phone]

## 2018-09-19 NOTE — Anesthesia Postprocedure Evaluation (Signed)
Anesthesia Post Note  Patient: Brittney Contreras  Procedure(s) Performed: INSERTION OF DIALYSIS CATHETER (Right )     Patient location during evaluation: PACU Anesthesia Type: MAC Level of consciousness: awake and alert Pain management: pain level controlled Vital Signs Assessment: post-procedure vital signs reviewed and stable Respiratory status: spontaneous breathing, nonlabored ventilation, respiratory function stable and patient connected to nasal cannula oxygen Cardiovascular status: stable and blood pressure returned to baseline Postop Assessment: no apparent nausea or vomiting Anesthetic complications: no    Last Vitals:  Vitals:   09/19/18 1959 09/19/18 2023  BP: (!) 201/104 121/68  Pulse:    Resp:    Temp:    SpO2:      Last Pain:  Vitals:   09/19/18 1929  TempSrc:   PainSc: 0-No pain                 [name]

## 2018-09-22 ENCOUNTER — Encounter (HOSPITAL_COMMUNITY): Payer: Self-pay | Admitting: Vascular Surgery

## 2018-09-23 ENCOUNTER — Encounter (HOSPITAL_COMMUNITY): Admission: RE | Payer: Self-pay | Source: Home / Self Care

## 2018-09-23 ENCOUNTER — Ambulatory Visit (HOSPITAL_COMMUNITY): Admission: RE | Admit: 2018-09-23 | Payer: Self-pay | Source: Home / Self Care | Admitting: Surgery

## 2018-09-23 SURGERY — A/V FISTULAGRAM
Anesthesia: LOCAL

## 2018-09-25 ENCOUNTER — Telehealth: Payer: Self-pay | Admitting: *Deleted

## 2018-09-25 NOTE — Telephone Encounter (Signed)
Tiffany at [place] notified that patient was a no show for fistulogram scheduled 09/23/2018.

## 2018-09-29 ENCOUNTER — Encounter (HOSPITAL_COMMUNITY): Payer: Self-pay | Admitting: Vascular Surgery

## 2018-10-06 ENCOUNTER — Telehealth: Payer: Self-pay | Admitting: Vascular Surgery

## 2018-10-06 NOTE — Telephone Encounter (Signed)
sch appt lvm mld ltr 11/06/2018 1215pm f/u MD

## 2018-10-06 NOTE — Telephone Encounter (Signed)
-----   Message from [name], MD sent at 09/20/2018  5:18 PM EDT ----- Brittney Contreras [id number] September 24, 1975  Her procedure for Tuesday can be cancelled and she can be seen by np/pa in the office in 4-6 weeks to evaluate her fistula.   Brittney Contreras

## 2018-10-14 MED FILL — CARVEDILOL 6.25 MG TABLET: 6.25 | 30 days supply | Qty: 60 | Fill #3

## 2018-10-14 MED FILL — hydrALAZINE HCL 50 MG TABS: 50 | 30 days supply | Qty: 60 | Fill #3

## 2018-10-14 MED FILL — AMLODIPINE BESYLATE 10 MG T: 10 | 30 days supply | Qty: 30 | Fill #3

## 2018-11-06 ENCOUNTER — Other Ambulatory Visit: Payer: Self-pay

## 2018-11-06 ENCOUNTER — Ambulatory Visit (INDEPENDENT_AMBULATORY_CARE_PROVIDER_SITE_OTHER): Payer: Medicaid Other | Admitting: Vascular Surgery

## 2018-11-06 ENCOUNTER — Encounter: Payer: Self-pay | Admitting: Vascular Surgery

## 2018-11-06 VITALS — BP 139/89 | HR 89 | Temp 97.8°F | Resp 12 | Ht 63.0 in | Wt 292.0 lb

## 2018-11-06 DIAGNOSIS — Z992 Dependence on renal dialysis: Secondary | ICD-10-CM | POA: Diagnosis not present

## 2018-11-06 DIAGNOSIS — N186 End stage renal disease: Secondary | ICD-10-CM

## 2018-11-06 NOTE — Progress Notes (Signed)
Patient is a 43 year old female who returns for follow-up today.  She recently had a tunneled dialysis catheter placed by Dr. Donzetta Matters after an infiltration in her right arm AV fistula.  The infiltration has now healed.  The patient has no further pain in her right arm.  She has a functioning right upper arm AV fistula.  Other chronic medical problems include hypertension morbid obesity obstructive sleep apnea.  These are all currently stable.  Past Medical History:  Diagnosis Date  . Chronic kidney disease   . Diastolic heart failure: Grade 3  07/25/2012  . Hypertension   . Hypertensive hypertrophic cardiomyopathy ([place]) 07/28/2012  . Malignant hypertension with congestive heart failure ([place]) 07/25/2012  . Obesity, morbid ([place]) 07/25/2012  . Obstructive sleep apnea   . Tobacco abuse 07/27/2012     Physical exam:  Vitals:   11/06/18 1246  BP: 139/89  Pulse: 89  Resp: 12  Temp: 97.8 F (36.6 C)  TempSrc: Oral  SpO2: 100%  Weight: 292 lb (132.5 kg)  Height: 5\' 3"  (1.6 m)    Right upper extremity large right upper arm fistula with some moderate tortuosity easily audible bruit  Neck: Right side dialysis catheter no evidence of infection  Assessment: Patient with resolving infiltration right arm AV fistula.  Plan: The patient will be scheduled for use of the fistula at her dialysis center.  If they are able to successfully stick this for the next week then we will get her scheduled for removal of her catheter.  Ruta Hinds, MD [facility] Office: [phone] Pager: [phone]

## 2018-11-11 ENCOUNTER — Encounter: Payer: Self-pay | Admitting: Internal Medicine

## 2018-11-11 ENCOUNTER — Other Ambulatory Visit: Payer: Self-pay

## 2018-11-11 ENCOUNTER — Ambulatory Visit: Payer: Medicaid Other | Attending: Internal Medicine | Admitting: Internal Medicine

## 2018-11-11 VITALS — BP 125/85 | HR 86 | Temp 98.3°F | Resp 16 | Ht 63.0 in | Wt 291.0 lb

## 2018-11-11 DIAGNOSIS — Z87891 Personal history of nicotine dependence: Secondary | ICD-10-CM | POA: Insufficient documentation

## 2018-11-11 DIAGNOSIS — Z79899 Other long term (current) drug therapy: Secondary | ICD-10-CM | POA: Insufficient documentation

## 2018-11-11 DIAGNOSIS — D649 Anemia, unspecified: Secondary | ICD-10-CM

## 2018-11-11 DIAGNOSIS — E039 Hypothyroidism, unspecified: Secondary | ICD-10-CM | POA: Insufficient documentation

## 2018-11-11 DIAGNOSIS — E785 Hyperlipidemia, unspecified: Secondary | ICD-10-CM | POA: Diagnosis not present

## 2018-11-11 DIAGNOSIS — N186 End stage renal disease: Secondary | ICD-10-CM

## 2018-11-11 DIAGNOSIS — N2581 Secondary hyperparathyroidism of renal origin: Secondary | ICD-10-CM | POA: Diagnosis not present

## 2018-11-11 DIAGNOSIS — M170 Bilateral primary osteoarthritis of knee: Secondary | ICD-10-CM | POA: Diagnosis not present

## 2018-11-11 DIAGNOSIS — I5032 Chronic diastolic (congestive) heart failure: Secondary | ICD-10-CM

## 2018-11-11 DIAGNOSIS — Z992 Dependence on renal dialysis: Secondary | ICD-10-CM | POA: Diagnosis not present

## 2018-11-11 DIAGNOSIS — I150 Renovascular hypertension: Secondary | ICD-10-CM | POA: Diagnosis not present

## 2018-11-11 DIAGNOSIS — Z6841 Body Mass Index (BMI) 40.0 and over, adult: Secondary | ICD-10-CM | POA: Insufficient documentation

## 2018-11-11 DIAGNOSIS — I132 Hypertensive heart and chronic kidney disease with heart failure and with stage 5 chronic kidney disease, or end stage renal disease: Secondary | ICD-10-CM | POA: Diagnosis not present

## 2018-11-11 DIAGNOSIS — I10 Essential (primary) hypertension: Secondary | ICD-10-CM | POA: Diagnosis present

## 2018-11-11 DIAGNOSIS — Z8249 Family history of ischemic heart disease and other diseases of the circulatory system: Secondary | ICD-10-CM | POA: Diagnosis not present

## 2018-11-11 NOTE — Progress Notes (Signed)
Pt didn't bring medications with her

## 2018-11-11 NOTE — Progress Notes (Signed)
Patient ID: ANASIA AGRO, female    DOB: [birth year]  MRN: [id number]  CC: Hypertension   Subjective: Brittney Contreras is a 43 y.o. female who presents for chronic disease management.  Last seen 10/2017. Her concerns today include:  hx HTN, ESRD on HD Mon/Wd/Fri, chronic dCHF EF 55% 12/2016, former smoker, andhypertrophicCM  HTN-ESR/diastolic CHF since I last saw her, she was started HD 2 mth ago. Followed by Dr. Diterdig with [facility] Did not bring meds with her today. Reports she is on 3 meds but the only one she can recall is the carvedilol Compliant with meds and salt restriction Denies CP/SOB/LE edema/PND  Obesity:  Loss 22 lbs since I last saw her.  Appetite has decrease since started on HD. "I do try to get out there and walk at least 2-3 x a wk." complains of pain in the knees when she walks for extended period of time so she has to sit intermittently.  Endorses stiffness in the knees.  Request form be completed to allow her to have a handicap sticker.  She herself does not drive but would like to have one for relatives who do take her around to her appointments and other activities like going to the grocery store.  She denies any falls.  She ambulates with a cane.   Patient Active Problem List   Diagnosis Date Noted  . ESRD (end stage renal disease) ([place]) 09/16/2018  . Periumbilical abdominal pain 06/20/2017  . Missed period 06/20/2017  . Bilateral low back pain 06/20/2017  . 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 Former smoker 03/28/2017  . Hyperlipidemia 03/28/2017  . Chronic diastolic heart failure ([place]) 03/23/2017  . Systolic murmur 32/20/2542  . Unspecified hypothyroidism 04/08/2013  . Noncompliance 04/08/2013  . Secondary hyperparathyroidism ([place]) 03/23/2013  . CKD (chronic kidney disease) stage 4, GFR 15-29 ml/min (H[facility]) 07/30/2012  . Hypertensive hypertrophic cardiomyopathy ([place]) 07/28/2012  . Hypertension 07/25/2012  . Obesity, morbid ([place]) 07/25/2012     Current Outpatient Medications on File Prior to Visit  Medication Sig Dispense Refill  . b complex-vitamin c-folic acid (NEPHRO-VITE) 0.8 MG TABS tablet TAKE 1 TABLET BY MOUTH EVERY DAY (ON DIALYSIS DAYS, TAKE AFTER DIALYSIS TREATMENT)    . carvedilol (COREG) 6.25 MG tablet Take 1 tablet (6.25 mg total) by mouth 2 (two) times daily. 180 tablet 3  . furosemide (LASIX) 40 MG tablet Take 1 tablet (40 mg total) by mouth daily. 90 tablet 1   Current Facility-Administered Medications on File Prior to Visit  Medication Dose Route Frequency Provider Last Rate Last Dose  . cloNIDine (CATAPRES) tablet 0.2 mg  0.2 mg Oral Once End, Harrell Gave, MD        No Known Allergies  Social History   Socioeconomic History  . Marital status: Single    Spouse name: Not on file  . Number of children: Not on file  . Years of education: Not on file  . Highest education level: Not on file  Occupational History  . Not on file  Social Needs  . Financial resource strain: Not on file  . Food insecurity:    Worry: Not on file    Inability: Not on file  . Transportation needs:    Medical: Not on file    Non-medical: Not on file  Tobacco Use  . Smoking status: Former Smoker    Packs/day: 0.30    Years: 20.00    Pack years: 6.00    Types: Cigarettes    Last  attempt to quit: 07/2016    Years since quitting: 2.3  . Smokeless tobacco: Never Used  . Tobacco comment: pack lasts 4-5 days  Substance and Sexual Activity  . Alcohol use: No    Comment: occ  . Drug use: No    Comment: No hx of IV Use  . Sexual activity: Not on file  Lifestyle  . Physical activity:    Days per week: Not on file    Minutes per session: Not on file  . Stress: Not on file  Relationships  . Social connections:    Talks on phone: Not on file    Gets together: Not on file    Attends religious service: Not on file    Active member of club or organization: Not on file    Attends meetings of clubs or organizations: Not on file     Relationship status: Not on file  . Intimate partner violence:    Fear of current or ex partner: Not on file    Emotionally abused: Not on file    Physically abused: Not on file    Forced sexual activity: Not on file  Other Topics Concern  . Not on file  Social History Narrative   Lives with her uncle   Was previously a CNA but currently unemployeed          Family History  Problem Relation Age of Onset  . Hypertension Mother   . Heart failure Mother   . Prostate cancer Father   . Kidney disease Other        Aunt, deceased, had been on dialysis    Past Surgical History:  Procedure Laterality Date  . AV FISTULA PLACEMENT Right 08/01/2012   Procedure: ARTERIOVENOUS (AV) FISTULA CREATION;  Surgeon: Mal Misty, MD;  Location: [place];  Service: Vascular;  Laterality: Right;  Ultrasound guided; Attempted wrist AV Fistula Creation.  . AV FISTULA PLACEMENT Right 07/2012  . INSERTION OF DIALYSIS CATHETER Right 09/19/2018   Procedure: INSERTION OF DIALYSIS CATHETER;  Surgeon: [name], MD;  Location: [place];  Service: Vascular;  Laterality: Right;  . LIGATION OF COMPETING BRANCHES OF ARTERIOVENOUS FISTULA Right 11/05/2012   Procedure: LIGATION OF COMPETING BRANCHES OF ARTERIOVENOUS FISTULA;  Surgeon: Mal Misty, MD;  Location: [place];  Service: Vascular;  Laterality: Right;  . SHUNTOGRAM Right 12/30/2012   Procedure: FISTULOGRAM;  Surgeon: Serafina Mitchell, MD;  Location: [facility] CATH LAB;  Service: Cardiovascular;  Laterality: Right;    ROS: Review of Systems Negative except as stated above  PHYSICAL EXAM: BP 125/85   Pulse 86   Temp 98.3 F (36.8 C) (Oral)   Resp 16   Ht 5' 3" (1.6 m)   Wt 291 lb (132 kg)   LMP 03/15/2017   SpO2 99%   BMI 51.55 kg/m   Wt Readings from Last 3 Encounters:  11/11/18 291 lb (132 kg)  11/06/18 292 lb (132.5 kg)  09/19/18 (!) 300 lb 9.6 oz (136.4 kg)   Physical Exam   General appearance - alert, well appearing, obese  African-American female and in no distress Mental status - normal mood, behavior, speech, dress, motor activity, and thought processes Mouth - mucous membranes moist, pharynx normal without lesions Neck - supple, no significant adenopathy Chest - clear to auscultation, no wheezes, rales or rhonchi, symmetric air entry Heart -regular rate rhythm. Musculoskeletal -patient ambulates with a cane.  Knee joint slightly enlarged.  No erythema or edema.  Mild crepitus  on passive range of motion.   Extremities -no lower extremity edema.  Her HD fistula is in the right upper arm. Skin: She has a catheter on the right upper chest wall CMP Latest Ref Rng & Units 09/19/2018 03/28/2017 09/11/2016  Glucose 70 - 99 mg/dL 97 76 83  BUN 6 - 20 mg/dL 33(H) 39(H) 41(H)  Creatinine 0.44 - 1.00 mg/dL 6.11(H) 3.41(H) 2.97(H)  Sodium 135 - 145 mmol/L 145 146(H) 143  Potassium 3.5 - 5.1 mmol/L 3.4(L) 5.1 4.5  Chloride 98 - 111 mmol/L 110 114(H) 107(H)  CO2 22 - 32 mmol/L 23 17(L) 17(L)  Calcium 8.9 - 10.3 mg/dL 7.9(L) 8.2(L) 8.9  Total Protein 6.0 - 8.5 g/dL - 6.2 -  Total Bilirubin 0.0 - 1.2 mg/dL - 0.4 -  Alkaline Phos 39 - 117 IU/L - 69 -  AST 0 - 40 IU/L - 26 -  ALT 0 - 32 IU/L - 26 -   Lipid Panel     Component Value Date/Time   CHOL 148 09/11/2016 1113   TRIG 69 09/11/2016 1113   HDL 57 09/11/2016 1113   CHOLHDL 2.6 09/11/2016 1113   CHOLHDL 3.5 05/03/2016 1028   VLDL 14 05/03/2016 1028   LDLCALC 77 09/11/2016 1113    CBC    Component Value Date/Time   WBC 6.7 09/19/2018 1513   RBC 3.34 (L) 09/19/2018 1513   HGB 9.4 (L) 09/19/2018 1513   HGB 13.4 03/28/2017 1220   HCT 29.8 (L) 09/19/2018 1513   HCT 39.5 03/28/2017 1220   PLT 102 (L) 09/19/2018 1513   PLT 181 03/28/2017 1220   MCV 89.2 09/19/2018 1513   MCV 84 03/28/2017 1220   MCH 28.1 09/19/2018 1513   MCHC 31.5 09/19/2018 1513   RDW 17.5 (H) 09/19/2018 1513   RDW 16.3 (H) 03/28/2017 1220   LYMPHSABS 2.1 09/11/2016 1113   MONOABS  920 05/03/2016 1028   EOSABS 0.1 09/11/2016 1113   BASOSABS 0.0 09/11/2016 1113    ASSESSMENT AND PLAN: 1. Renovascular hypertension She will continue carvedilol and low-salt diet.  I have asked her to bring her medicines with her on her next visit so we can update her medication list  2. Primary osteoarthritis of both knees Recommend weight loss strongly. Use Tylenol as needed. Completed form to allow her to get a handicap sticker  3. Morbid obesity ([place]) Dietary counseling given.  Encouraged her to continue to be active as much as tolerated.  Commended her on weight loss so far  4. ESRD on hemodialysis ([place])  5. Chronic diastolic congestive heart failure (H[facility]) Clinically stable.  6. Secondary hyperparathyroidism ([place]) Followed by nephrology.  7. Normochromic anemia Likely anemia of chronic disease associated with ESRD.  Patient is not sure whether she receives ESA at dialysis. - Iron, TIBC and Ferritin Panel   Patient was given the opportunity to ask questions.  Patient verbalized understanding of the plan and was able to repeat key elements of the plan.   Orders Placed This Encounter  Procedures  . Iron, TIBC and Ferritin Panel     Requested Prescriptions    No prescriptions requested or ordered in this encounter    Return in about 6 weeks (around 12/23/2018) for PAP.  Karle Plumber, MD, FACP

## 2018-11-12 ENCOUNTER — Other Ambulatory Visit: Payer: Self-pay | Admitting: Internal Medicine

## 2018-11-12 LAB — IRON,TIBC AND FERRITIN PANEL
Ferritin: 46 ng/mL (ref 15–150)
Iron Saturation: 14 % — ABNORMAL LOW (ref 15–55)
Iron: 42 ug/dL (ref 27–159)
Total Iron Binding Capacity: 291 ug/dL (ref 250–450)
UIBC: 249 ug/dL (ref 131–425)

## 2018-11-12 MED ORDER — FERROUS SULFATE 325 (65 FE) MG PO TABS: 325.0000 mg | ORAL_TABLET | Freq: Every day | ORAL | 1 refills | Status: DC

## 2018-11-14 ENCOUNTER — Telehealth: Payer: Self-pay

## 2018-11-14 NOTE — Telephone Encounter (Signed)
Contacted pt to go over lab results pt didn't answer left a detailed vm informing pt of results and if she has any questions or concerns to give me a call  

## 2018-11-17 ENCOUNTER — Other Ambulatory Visit: Payer: Self-pay

## 2018-11-17 ENCOUNTER — Encounter (HOSPITAL_COMMUNITY): Payer: Self-pay | Admitting: Emergency Medicine

## 2018-11-17 ENCOUNTER — Emergency Department (HOSPITAL_COMMUNITY): Payer: Medicaid Other

## 2018-11-17 ENCOUNTER — Emergency Department (HOSPITAL_BASED_OUTPATIENT_CLINIC_OR_DEPARTMENT_OTHER): Payer: Medicaid Other

## 2018-11-17 ENCOUNTER — Inpatient Hospital Stay (HOSPITAL_COMMUNITY)
Admission: EM | Admit: 2018-11-17 | Discharge: 2018-11-21 | DRG: 562 | Disposition: A | Discharge status: Home Health | Payer: Medicaid Other | Attending: Student in an Organized Health Care Education/Training Program | Admitting: Student in an Organized Health Care Education/Training Program

## 2018-11-17 DIAGNOSIS — X58XXXA Exposure to other specified factors, initial encounter: Secondary | ICD-10-CM | POA: Diagnosis present

## 2018-11-17 DIAGNOSIS — M79605 Pain in left leg: Secondary | ICD-10-CM

## 2018-11-17 DIAGNOSIS — F17210 Nicotine dependence, cigarettes, uncomplicated: Secondary | ICD-10-CM | POA: Diagnosis present

## 2018-11-17 DIAGNOSIS — Z79899 Other long term (current) drug therapy: Secondary | ICD-10-CM

## 2018-11-17 DIAGNOSIS — S83232A Complex tear of medial meniscus, current injury, left knee, initial encounter: Principal | ICD-10-CM | POA: Diagnosis present

## 2018-11-17 DIAGNOSIS — E872 Acidosis, unspecified: Secondary | ICD-10-CM

## 2018-11-17 DIAGNOSIS — I422 Other hypertrophic cardiomyopathy: Secondary | ICD-10-CM | POA: Diagnosis present

## 2018-11-17 DIAGNOSIS — I132 Hypertensive heart and chronic kidney disease with heart failure and with stage 5 chronic kidney disease, or end stage renal disease: Secondary | ICD-10-CM | POA: Diagnosis present

## 2018-11-17 DIAGNOSIS — Z841 Family history of disorders of kidney and ureter: Secondary | ICD-10-CM

## 2018-11-17 DIAGNOSIS — Z66 Do not resuscitate: Secondary | ICD-10-CM | POA: Diagnosis present

## 2018-11-17 DIAGNOSIS — G4733 Obstructive sleep apnea (adult) (pediatric): Secondary | ICD-10-CM | POA: Diagnosis present

## 2018-11-17 DIAGNOSIS — M898X9 Other specified disorders of bone, unspecified site: Secondary | ICD-10-CM | POA: Diagnosis present

## 2018-11-17 DIAGNOSIS — M2342 Loose body in knee, left knee: Secondary | ICD-10-CM | POA: Diagnosis present

## 2018-11-17 DIAGNOSIS — M1712 Unilateral primary osteoarthritis, left knee: Secondary | ICD-10-CM | POA: Diagnosis present

## 2018-11-17 DIAGNOSIS — I5032 Chronic diastolic (congestive) heart failure: Secondary | ICD-10-CM | POA: Diagnosis present

## 2018-11-17 DIAGNOSIS — N186 End stage renal disease: Secondary | ICD-10-CM

## 2018-11-17 DIAGNOSIS — M25562 Pain in left knee: Secondary | ICD-10-CM | POA: Diagnosis present

## 2018-11-17 DIAGNOSIS — Z9115 Patient's noncompliance with renal dialysis: Secondary | ICD-10-CM

## 2018-11-17 DIAGNOSIS — M659 Synovitis and tenosynovitis, unspecified: Secondary | ICD-10-CM | POA: Diagnosis present

## 2018-11-17 DIAGNOSIS — G4700 Insomnia, unspecified: Secondary | ICD-10-CM | POA: Diagnosis present

## 2018-11-17 DIAGNOSIS — Z560 Unemployment, unspecified: Secondary | ICD-10-CM

## 2018-11-17 DIAGNOSIS — Z1159 Encounter for screening for other viral diseases: Secondary | ICD-10-CM

## 2018-11-17 DIAGNOSIS — Z8249 Family history of ischemic heart disease and other diseases of the circulatory system: Secondary | ICD-10-CM

## 2018-11-17 DIAGNOSIS — Z992 Dependence on renal dialysis: Secondary | ICD-10-CM

## 2018-11-17 DIAGNOSIS — M25462 Effusion, left knee: Secondary | ICD-10-CM | POA: Diagnosis present

## 2018-11-17 DIAGNOSIS — D631 Anemia in chronic kidney disease: Secondary | ICD-10-CM | POA: Diagnosis present

## 2018-11-17 DIAGNOSIS — Z8042 Family history of malignant neoplasm of prostate: Secondary | ICD-10-CM

## 2018-11-17 DIAGNOSIS — R52 Pain, unspecified: Secondary | ICD-10-CM | POA: Diagnosis not present

## 2018-11-17 DIAGNOSIS — S83272A Complex tear of lateral meniscus, current injury, left knee, initial encounter: Secondary | ICD-10-CM | POA: Diagnosis present

## 2018-11-17 HISTORY — DX: Anemia, unspecified: D64.9

## 2018-11-17 HISTORY — DX: Pneumonia, unspecified organism: J18.9

## 2018-11-17 LAB — CBC WITH DIFFERENTIAL/PLATELET
Abs Immature Granulocytes: 0.04 10*3/uL (ref 0.00–0.07)
Basophils Absolute: 0 10*3/uL (ref 0.0–0.1)
Basophils Relative: 0 %
Eosinophils Absolute: 0.3 10*3/uL (ref 0.0–0.5)
Eosinophils Relative: 4 %
HCT: 31.8 % — ABNORMAL LOW (ref 36.0–46.0)
Hemoglobin: 10.3 g/dL — ABNORMAL LOW (ref 12.0–15.0)
Immature Granulocytes: 1 %
Lymphocytes Relative: 14 %
Lymphs Abs: 1.1 10*3/uL (ref 0.7–4.0)
MCH: 27.1 pg (ref 26.0–34.0)
MCHC: 32.4 g/dL (ref 30.0–36.0)
MCV: 83.7 fL (ref 80.0–100.0)
Monocytes Absolute: 0.6 10*3/uL (ref 0.1–1.0)
Monocytes Relative: 7 %
Neutro Abs: 6 10*3/uL (ref 1.7–7.7)
Neutrophils Relative %: 74 %
Platelets: 232 10*3/uL (ref 150–400)
RBC: 3.8 MIL/uL — ABNORMAL LOW (ref 3.87–5.11)
RDW: 17 % — ABNORMAL HIGH (ref 11.5–15.5)
WBC: 8 10*3/uL (ref 4.0–10.5)
nRBC: 0 % (ref 0.0–0.2)

## 2018-11-17 LAB — COMPREHENSIVE METABOLIC PANEL
ALT: 9 U/L (ref 0–44)
AST: 23 U/L (ref 15–41)
Albumin: 3.4 g/dL — ABNORMAL LOW (ref 3.5–5.0)
Alkaline Phosphatase: 46 U/L (ref 38–126)
Anion gap: 11 (ref 5–15)
BUN: 72 mg/dL — ABNORMAL HIGH (ref 6–20)
CO2: 16 mmol/L — ABNORMAL LOW (ref 22–32)
Calcium: 9.1 mg/dL (ref 8.9–10.3)
Chloride: 116 mmol/L — ABNORMAL HIGH (ref 98–111)
Creatinine, Ser: 8.43 mg/dL — ABNORMAL HIGH (ref 0.44–1.00)
GFR calc Af Amer: 6 mL/min — ABNORMAL LOW (ref >60)
GFR calc non Af Amer: 5 mL/min — ABNORMAL LOW (ref >60)
Glucose, Bld: 89 mg/dL (ref 70–99)
Potassium: 4.6 mmol/L (ref 3.5–5.1)
Sodium: 143 mmol/L (ref 135–145)
Total Bilirubin: 0.7 mg/dL (ref 0.3–1.2)
Total Protein: 7 g/dL (ref 6.5–8.1)

## 2018-11-17 LAB — URINALYSIS, ROUTINE W REFLEX MICROSCOPIC
Bilirubin Urine: NEGATIVE
Glucose, UA: NEGATIVE mg/dL
Hgb urine dipstick: NEGATIVE
Ketones, ur: NEGATIVE mg/dL
Leukocytes,Ua: NEGATIVE
Nitrite: NEGATIVE
Protein, ur: 100 mg/dL — AB
Specific Gravity, Urine: 1.016 (ref 1.005–1.030)
pH: 5 (ref 5.0–8.0)

## 2018-11-17 MED ORDER — OXYCODONE HCL 5 MG PO TABS
5.0000 mg | ORAL_TABLET | Freq: Once | ORAL | Status: AC
  Administered 2018-11-17: 5 mg via ORAL
  Filled 2018-11-17: qty 1

## 2018-11-17 MED ORDER — ZOLPIDEM TARTRATE 5 MG PO TABS
5.0000 mg | ORAL_TABLET | Freq: Every evening | ORAL | Status: DC | PRN
  Administered 2018-11-20 (×2): 5 mg via ORAL
  Filled 2018-11-17 (×3): qty 1

## 2018-11-17 MED ORDER — CARVEDILOL 6.25 MG PO TABS
6.2500 mg | ORAL_TABLET | Freq: Two times a day (BID) | ORAL | Status: DC
  Administered 2018-11-17 – 2018-11-20 (×6): 6.25 mg via ORAL
  Filled 2018-11-17 (×7): qty 1

## 2018-11-17 MED ORDER — HYDROMORPHONE HCL 1 MG/ML IJ SOLN
1.0000 mg | Freq: Once | INTRAMUSCULAR | Status: DC
  Filled 2018-11-17: qty 1

## 2018-11-17 MED ORDER — CHLORHEXIDINE GLUCONATE CLOTH 2 % EX PADS
6.0000 | MEDICATED_PAD | Freq: Every day | CUTANEOUS | Status: DC
  Administered 2018-11-18 – 2018-11-21 (×4): 6 via TOPICAL

## 2018-11-17 MED ORDER — DOXERCALCIFEROL 4 MCG/2ML IV SOLN
4.0000 ug | INTRAVENOUS | Status: DC
  Administered 2018-11-19 – 2018-11-21 (×2): 4 ug via INTRAVENOUS

## 2018-11-17 MED ORDER — HYDROCODONE-ACETAMINOPHEN 5-325 MG PO TABS
1.0000 | ORAL_TABLET | Freq: Four times a day (QID) | ORAL | Status: DC | PRN
  Administered 2018-11-17 – 2018-11-19 (×4): 2 via ORAL
  Filled 2018-11-17 (×4): qty 2

## 2018-11-17 MED ORDER — RENA-VITE PO TABS
1.0000 | ORAL_TABLET | Freq: Every day | ORAL | Status: DC
  Administered 2018-11-17 – 2018-11-20 (×4): 1 via ORAL
  Filled 2018-11-17 (×5): qty 1

## 2018-11-17 MED ORDER — HEPARIN SODIUM (PORCINE) 5000 UNIT/ML IJ SOLN
5000.0000 [IU] | Freq: Three times a day (TID) | INTRAMUSCULAR | Status: DC
  Administered 2018-11-17 – 2018-11-21 (×10): 5000 [IU] via SUBCUTANEOUS
  Filled 2018-11-17 (×11): qty 1

## 2018-11-17 NOTE — ED Notes (Signed)
Admitting at bedside 

## 2018-11-17 NOTE — ED Notes (Signed)
Attempted to call report. RN unable to take report at this time. Will call this RN back.

## 2018-11-17 NOTE — ED Notes (Signed)
Pt cleaned up. Dry sheets placed. Pt still yelling out in pain.

## 2018-11-17 NOTE — ED Notes (Signed)
Pt urinated all around pure wick. Pt yelling out in pain after pure wick placed. C/o pain to left leg/knee.

## 2018-11-17 NOTE — ED Provider Notes (Addendum)
[facility] Emergency Department Provider Note MRN:  [id number]  Arrival date & time: 11/17/18     Chief Complaint   Leg Pain   History of Present Illness   Brittney Contreras is a 43 y.o. year-old female with a history of CHF, ESRD presenting to the ED with chief complaint of leg pain.  Patient has been endorsing left hip and thigh pain for 1 week.  Has not been able to walk for 1 week due to this pain.  Denies numbness or weakness to the arms or legs, denies bowel or bladder dysfunction, denies back pain.  Denying fever, no cough, no chest pain or shortness of breath, no abdominal pain.  Denies trauma.  Pain is constant, worse with motion, moderate in severity.  Patient has gone at least 3 days without dialysis due to this pain and inability to get her appointments. Review of Systems  A complete 10 system review of systems was obtained and all systems are negative except as noted in the HPI and PMH.   Patient's Health History    Past Medical History:  Diagnosis Date  . Chronic kidney disease   . Diastolic heart failure: Grade 3  07/25/2012  . Hypertension   . Hypertensive hypertrophic cardiomyopathy ([place]) 07/28/2012  . Malignant hypertension with congestive heart failure ([place]) 07/25/2012  . Obesity, morbid ([place]) 07/25/2012  . Obstructive sleep apnea   . Tobacco abuse 07/27/2012    Past Surgical History:  Procedure Laterality Date  . AV FISTULA PLACEMENT Right 08/01/2012   Procedure: ARTERIOVENOUS (AV) FISTULA CREATION;  Surgeon: Mal Misty, MD;  Location: [place];  Service: Vascular;  Laterality: Right;  Ultrasound guided; Attempted wrist AV Fistula Creation.  . AV FISTULA PLACEMENT Right 07/2012  . INSERTION OF DIALYSIS CATHETER Right 09/19/2018   Procedure: INSERTION OF DIALYSIS CATHETER;  Surgeon: [name], MD;  Location: [place];  Service: Vascular;  Laterality: Right;  . LIGATION OF COMPETING BRANCHES OF ARTERIOVENOUS FISTULA Right 11/05/2012   Procedure: LIGATION OF COMPETING BRANCHES OF ARTERIOVENOUS FISTULA;  Surgeon: Mal Misty, MD;  Location: [place];  Service: Vascular;  Laterality: Right;  . SHUNTOGRAM Right 12/30/2012   Procedure: FISTULOGRAM;  Surgeon: Serafina Mitchell, MD;  Location: [facility] CATH LAB;  Service: Cardiovascular;  Laterality: Right;    Family History  Problem Relation Age of Onset  . Hypertension Mother   . Heart failure Mother   . Prostate cancer Father   . Kidney disease Other        Aunt, deceased, had been on dialysis    Social History   Socioeconomic History  . Marital status: Single    Spouse name: Not on file  . Number of children: Not on file  . Years of education: Not on file  . Highest education level: Not on file  Occupational History  . Not on file  Social Needs  . Financial resource strain: Not on file  . Food insecurity    Worry: Not on file    Inability: Not on file  . Transportation needs    Medical: Not on file    Non-medical: Not on file  Tobacco Use  . Smoking status: Former Smoker    Packs/day: 0.30    Years: 20.00    Pack years: 6.00    Types: Cigarettes    Quit date: 07/2016    Years since quitting: 2.3  . Smokeless tobacco: Never Used  . Tobacco comment: pack lasts 4-5 days  Substance and Sexual Activity  . Alcohol use: No    Comment: occ  . Drug use: No    Comment: No hx of IV Use  . Sexual activity: Not on file  Lifestyle  . Physical activity    Days per week: Not on file    Minutes per session: Not on file  . Stress: Not on file  Relationships  . Social Herbalist on phone: Not on file    Gets together: Not on file    Attends religious service: Not on file    Active member of club or organization: Not on file    Attends meetings of clubs or organizations: Not on file    Relationship status: Not on file  . Intimate partner violence    Fear of current or ex partner: Not on file    Emotionally abused: Not on file    Physically abused: Not on  file    Forced sexual activity: Not on file  Other Topics Concern  . Not on file  Social History Narrative   Lives with her uncle   Was previously a CNA but currently unemployeed           Physical Exam  Vital Signs and Nursing Notes reviewed Vitals:   11/17/18 1600 11/17/18 1715  BP:    Pulse: 86   Resp: 20 17  SpO2: 98%     CONSTITUTIONAL: Well-appearing, NAD NEURO:  Alert and oriented x 3, no focal deficits EYES:  eyes equal and reactive ENT/NECK:  no LAD, no JVD CARDIO: Regular rate, well-perfused, normal S1 and S2 PULM:  CTAB no wheezing or rhonchi GI/GU:  normal bowel sounds, non-distended, non-tender MSK/SPINE:  No gross deformities, no edema SKIN:  no rash, atraumatic PSYCH:  Appropriate speech and behavior  Diagnostic and Interventional Summary    EKG Interpretation  Date/Time:  Monday November 17 2018 12:56:07 EDT Ventricular Rate:  75 PR Interval:    QRS Duration: 98 QT Interval:  442 QTC Calculation: 494 R Axis:   89 Text Interpretation:  Sinus rhythm Borderline T wave abnormalities Borderline prolonged QT interval Confirmed by Gerlene Fee [phone]) on 11/17/2018 1:10:57 PM Also confirmed by Gerlene Fee [phone]), editor Philomena Doheny [phone])  on 11/17/2018 2:27:49 PM      Labs Reviewed  CBC WITH DIFFERENTIAL/PLATELET - Abnormal; Notable for the following components:      Result Value   RBC 3.80 (*)    Hemoglobin 10.3 (*)    HCT 31.8 (*)    RDW 17.0 (*)    All other components within normal limits  COMPREHENSIVE METABOLIC PANEL - Abnormal; Notable for the following components:   Chloride 116 (*)    CO2 16 (*)    BUN 72 (*)    Creatinine, Ser 8.43 (*)    Albumin 3.4 (*)    GFR calc non Af Amer 5 (*)    GFR calc Af Amer 6 (*)    All other components within normal limits  URINALYSIS, ROUTINE W REFLEX MICROSCOPIC - Abnormal; Notable for the following components:   APPearance HAZY (*)    Protein, ur 100 (*)    Bacteria, UA RARE (*)    All other  components within normal limits  NOVEL CORONAVIRUS, NAA (HOSPITAL ORDER, SEND-OUT TO REF LAB)  CK    VAS [place] LOWER EXTREMITY VENOUS (DVT) (ONLY [facility] & [facility])  Final Result    DG Pelvis 1-2 Views  Final Result    DG  Femur Min 2 Views Left  Final Result      Medications  HYDROmorphone (DILAUDID) injection 1 mg (1 mg Subcutaneous Refused 11/17/18 1716)  oxyCODONE (Oxy IR/ROXICODONE) immediate release tablet 5 mg (5 mg Oral Given 11/17/18 1412)     Procedures Critical Care  ED Course and Medical Decision Making  I have reviewed the triage vital signs and the nursing notes.  Pertinent labs & imaging results that were available during my care of the patient were reviewed by me and considered in my medical decision making (see below for details).  Missed dialysis in this 43 year old female, considering hyperkalemia though EKG is without signs of this.  Patient does not appear fluid overloaded, will keep her monitored and await laboratory evaluation.  Patient does not have numbness or weakness to the legs, no bowel or bladder dysfunction, nothing to suggest myelopathy.  Patient is not even complaining of back pain.  Considering MSK etiology, DVT, will start with x-rays and ultrasound.  Work-up reveals acidosis and elevated BUN suggesting the patient needs urgent dialysis here in the hospital.  No clear etiology for patient's pain, presumed musculoskeletal, again with no evidence to suggest infection, intact range of motion of the hip, not concerned about septic joint, ultrasound negative for DVT, x-rays unrevealing.  Admitted to hospitalist service for dialysis as well as further pain control and/or diagnostics of this pain.  Update 5:20 PM: Spoke with Dr. Joelyn Oms of [place] [facility], who is aware of the patient's need for dialysis while an inpatient and will come to evaluate the patient.   Barth Kirks. [name], [place] [email or web address]  Final  Clinical Impressions(s) / ED Diagnoses     ICD-10-CM   1. Left leg pain  M79.605   2. Acidosis  E87.2     ED Discharge Orders    None         Maudie Flakes, MD 11/17/18 1717    Maudie Flakes, MD 11/17/18 [phone]

## 2018-11-17 NOTE — ED Notes (Signed)
Patient transported to X-ray 

## 2018-11-17 NOTE — Consult Note (Signed)
Brittney Contreras Admit Date: 11/17/2018 11/17/2018 Brittney Contreras Requesting Physician:  [name] MD  Reason for Consult:  ESRD Comanagement HPI:  43 year old female ESRD at [facility] presented to the emergency room today after failure to attend last 3 hemodialysis treatments because of progressive pain in the left leg.  Patient has been admitted to the internal medicine service for further work-up including an MRI.  We have been consulted for ESRD comanagement.  Patient started hemodialysis approximately 2 months ago.  She currently is using a right IJ tunneled dialysis catheter and has a right upper extremity AV tortuous mega-fistula followed by VVS with plans to attempt cannulation.  PMH Incudes:  Hypertension  Diastolic heart failure  Morbid obesity  Outpt HD Orders Unit: [facility] Days: M WF Time: 4 hours  Dialyzer: F-180 EDW: 130.5 kg K/Ca: 2/2 Access: TDC and right upper extremity aVF Needle Size: N/a BFR/DFR: 300/800 UF Proflie: Profile 2 VDRA: Doxercalciferol 4 mcg q. treatment EPO: Mircera 100 mcg every 2 weeks, last administered 6/8 IV Fe: None currently, Heparin: 6000 units IV bolus  Creat (mg/dL)  Date Value  05/03/2016 3.18 (H)  06/16/2014 2.44 (H)   Creatinine, Ser (mg/dL)  Date Value  11/17/2018 8.43 (H)  09/19/2018 6.11 (H)  03/28/2017 3.41 (H)  09/11/2016 2.97 (H)  09/19/2015 2.67 (H)  12/30/2012 3.80 (H)  08/02/2012 5.75 (H)  08/01/2012 6.04 (H)  07/31/2012 5.55 (H)  07/30/2012 5.25 (H)  ]  Balance of 12 systems is negative w/ exceptions as above  PMH  Past Medical History:  Diagnosis Date  . Chronic kidney disease   . Diastolic heart failure: Grade 3  07/25/2012  . Hypertension   . Hypertensive hypertrophic cardiomyopathy ([place]) 07/28/2012  . Malignant hypertension with congestive heart failure ([place]) 07/25/2012  . Obesity, morbid ([place]) 07/25/2012  . Obstructive sleep apnea   . Tobacco abuse 07/27/2012   PSH  Past  Surgical History:  Procedure Laterality Date  . AV FISTULA PLACEMENT Right 08/01/2012   Procedure: ARTERIOVENOUS (AV) FISTULA CREATION;  Surgeon: Mal Misty, MD;  Location: [place];  Service: Vascular;  Laterality: Right;  Ultrasound guided; Attempted wrist AV Fistula Creation.  . AV FISTULA PLACEMENT Right 07/2012  . INSERTION OF DIALYSIS CATHETER Right 09/19/2018   Procedure: INSERTION OF DIALYSIS CATHETER;  Surgeon: [name], MD;  Location: [place];  Service: Vascular;  Laterality: Right;  . LIGATION OF COMPETING BRANCHES OF ARTERIOVENOUS FISTULA Right 11/05/2012   Procedure: LIGATION OF COMPETING BRANCHES OF ARTERIOVENOUS FISTULA;  Surgeon: Mal Misty, MD;  Location: [place];  Service: Vascular;  Laterality: Right;  . SHUNTOGRAM Right 12/30/2012   Procedure: FISTULOGRAM;  Surgeon: Serafina Mitchell, MD;  Location: [facility] C[facility] LAB;  Service: Cardiovascular;  Laterality: Right;   FH  Family History  Problem Relation Age of Onset  . Hypertension Mother   . Heart failure Mother   . Prostate cancer Father   . Kidney disease Other        Aunt, deceased, had been on dialysis   [place]  reports that she quit smoking about 2 years ago. Her smoking use included cigarettes. She has a 6.00 pack-year smoking history. She has never used smokeless tobacco. She reports that she does not drink alcohol or use drugs. Allergies No Known Allergies Home medications Prior to Admission medications   Medication Sig Start Date End Date Taking? Authorizing Provider  b complex-vitamin c-folic acid (NEPHRO-VITE) 0.8 MG TABS tablet Take 1 tablet by  mouth daily.  09/26/18  Yes [provider]  carvedilol (COREG) 6.25 MG tablet Take 1 tablet (6.25 mg total) by mouth 2 (two) times daily. 10/21/17  Yes Ladell Pier, MD  hydrochlorothiazide (HYDRODIURIL) 25 MG tablet Take 25 mg by mouth daily.   Yes [provider]  zolpidem (AMBIEN) 5 MG tablet Take 5 mg by mouth at bedtime as needed for  sleep.   Yes [provider]  ferrous sulfate 325 (65 FE) MG tablet Take 1 tablet (325 mg total) by mouth daily with breakfast. Patient not taking: Reported on 11/17/2018 11/12/18   Ladell Pier, MD  furosemide (LASIX) 40 MG tablet Take 1 tablet (40 mg total) by mouth daily. Patient not taking: Reported on 11/17/2018 10/21/17   Ladell Pier, MD    Current Medications Scheduled Meds: . carvedilol  6.25 mg Oral BID  . cloNIDine  0.2 mg Oral Once  . heparin  5,000 Units Subcutaneous Q8H  . multivitamin  1 tablet Oral QHS   Continuous Infusions: PRN Meds:.HYDROcodone-acetaminophen, zolpidem  CBC Recent Labs  Lab 11/17/18 1228  WBC 8.0  NEUTROABS 6.0  HGB 10.3*  HCT 31.8*  MCV 83.7  PLT 979   Basic Metabolic Panel Recent Labs  Lab 11/17/18 1228  NA 143  K 4.6  CL 116*  CO2 16*  GLUCOSE 89  BUN 72*  CREATININE 8.43*  CALCIUM 9.1    Physical Exam  Blood pressure (!) 164/92, pulse 86, resp. rate 17, last menstrual period 03/15/2017, SpO2 98 %. GEN: Morbidly obese, no acute distress ENT: NCAT EYES: EOMI CV: RRR, normal S1 and S2 PULM: Clear bilaterally, normal work of breathing ABD: Limited, soft, nontender SKIN: No rashes or lesions EXT: No significant edema in the lower extremities Vascular: Right upper arm tortuous negative fistula with bruit and thrill, tunneled dialysis catheter bandaged without erythema.   Assessment 43 year old female with 3 consecutive missed HD treatments secondary to progressive pain in the left leg and knee.  1. ESRD, missed HD x3 2. Left leg pain, work-up per primary, MRI planned 3. Tortuous negative fistula, followed by VVS last seen by Dr. Oneida Alar on 6/4 and notes that it is ready for attempted cannulation. 4. Morbid obesity 5. Hypertension  Plan 1. HD tomorrow, 2K, 3L UF, 6k IVB heparin, Offer to cannulate AVF while in house using 16g, UF Profile 2 2. MRI leg; PT/OT 3. Cont Binders, VDRA   Pearson Grippe  MD 11/17/2018, 8:01 PM

## 2018-11-17 NOTE — ED Notes (Signed)
Pt to 42M room 10.

## 2018-11-17 NOTE — H&P (Addendum)
Date: 11/17/2018               Patient Name:  Brittney Contreras MRN: [id number]  DOB: 23-Sep-1975 Age / Sex: 43 y.o., female   PCP: Brittney Pier, MD         Medical Service: Internal Medicine Teaching Service         Attending Physician: Dr. Evette Contreras, Brittney Contreras, *    First Contact: Dr. Gilberto Contreras Pager: [phone]  Second Contact: Dr. Kathi Contreras Pager: [phone]       After Hours (After 5p/  First Contact Pager: [phone]  weekends / holidays): Second Contact Pager: [phone]   Chief Complaint: Leg pain  History of Present Illness:  Brittney Contreras is a 43 yo F w/ PMH of ESRD on HD (MWF), HTN, HFpEF (EF 55%) and OSA presenting with complaints of leg pain. She was in her usual state of health until Wednesday morning (11/11/17) when she was getting up to go to dialysis and started to have acute onset sharp stabbing pain originating at the knee and radiating up to hip 10/10. Her transportation for dialysis came to pick her up but due to her severe knee pain, she was unable to go. She took Tylenol without significant relief. She mentions that she has been mostly homebound due to difficulty with ambulation and transportation issues. Her pain is intermittent but most severe when she puts weight on it and is trying to walk. She had family friend come over to assist in ambulation at home. She denies any fevers, chills, back pain, nausea, vomiting, diarrhea, or hx of gout. She was unable to go to dialysis due to this pain and has missed 3 sessions so far, including today.   In the ED, X-ray of left hip and femur were taken showing tricompartmental arhtrosis of the knee with calcified loose body and worsened renal function. ED provider was concerned about worsening adherence to dialysis without resolution of leg pain and IMTS was consulted for admission.   Meds: Current Facility-Administered Medications for the 11/17/18 encounter [facility] Encounter)  Medication  . cloNIDine (CATAPRES) tablet  0.2 mg   Current Meds  Medication Sig  . b complex-vitamin c-folic acid (NEPHRO-VITE) 0.8 MG TABS tablet Take 1 tablet by mouth daily.   . carvedilol (COREG) 6.25 MG tablet Take 1 tablet (6.25 mg total) by mouth 2 (two) times daily.  . hydrochlorothiazide (HYDRODIURIL) 25 MG tablet Take 25 mg by mouth daily.  Marland Kitchen zolpidem (AMBIEN) 5 MG tablet Take 5 mg by mouth at bedtime as needed for sleep.   Allergies: Allergies as of 11/17/2018  . (No Known Allergies)   Past Medical History:  Diagnosis Date  . Chronic kidney disease   . Diastolic heart failure: Grade 3  07/25/2012  . Hypertension   . Hypertensive hypertrophic cardiomyopathy ([place]) 07/28/2012  . Malignant hypertension with congestive heart failure ([place]) 07/25/2012  . Obesity, morbid ([place]) 07/25/2012  . Obstructive sleep apnea   . Tobacco abuse 07/27/2012   Family History:  Has history of 'bad kidney disease' in multiple family members  Family History  Problem Relation Age of Onset  . Hypertension Mother   . Heart failure Mother   . Prostate cancer Father   . Kidney disease Other        Aunt, deceased, had been on dialysis   Social History:  Lives with son and his wife. Currently unemployed. Smokes about 3-4 cigarettes starting this March after quitting it in 2018. Denies alcohol,  illicit drug use.  Social History   Tobacco Use  . Smoking status: Former Smoker    Packs/day: 0.30    Years: 20.00    Pack years: 6.00    Types: Cigarettes    Quit date: 07/2016    Years since quitting: 2.3  . Smokeless tobacco: Never Used  . Tobacco comment: pack lasts 4-5 days  Substance Use Topics  . Alcohol use: No    Comment: occ  . Drug use: No    Comment: No hx of IV Use   Review of Systems: A complete ROS was negative except as per HPI.  Physical Exam: Blood pressure (!) 164/92, pulse 86, resp. rate 17, last menstrual period 03/15/2017, SpO2 98 %.  Gen: Well-developed, obese, NAD HEENT: EOMI, MMM Neck: supple, ROM intact,  no JVD, tunneled catheter appear clean CV: RRR, S1, S2 normal, No rubs, no murmurs, no gallops, Right arm fistula w/ bruit Pulm: CTAB, No rales, no wheezes Abd: Soft, BS+, NTND, No rebound, no guarding Extm: Left knee with generalized tenderness to palpation (medial > lateral side) without edema, erythema or obvious effusion, passive range of motion intact, tenderness with active flexion and extension, negative lachman's, negative valgus stress, negative varus stress. No crepitus on flexion or extension. Unable to tolerate weight bearing, Peripheral pulses intact, No peripheral edema Skin: Dry, Warm, normal turgor Neuro: AAOx3, no focal weakness, no focal numbness  EKG: personally reviewed my interpretation is normal sinus, normal axis, no ST changes from prior EKG.  CXR: N/A  PELVIS - 1-2 VIEW; LEFT FEMUR 2 VIEWS FINDINGS: No fracture or dislocation of the left hip or left femur. The partially imaged pelvis and proximal right femur are unremarkable in single AP view only.  Moderate tricompartmental arthrosis of the included left knee with a calcified loose body in the superior joint recess.  IMPRESSION: 1. No fracture or dislocation of the left hip or left femur. The partially imaged pelvis and proximal right femur are unremarkable in single AP view only.  2. Moderate tricompartmental arthrosis of the included left knee with a calcified loose body in the superior joint recess.  Assessment & Plan by Problem: Principal Problem:   ESRD on hemodialysis [facility]) Active Problems:   Morbid obesity ([place])   Left knee pain  Brittney Contreras is a 43 yo F w/ PMH of ESRD on HD, HTN, HFpEF (EF 55%) and OSA presenting with complaint of leg pain 2/2 most likely osteoarthritis of the knee. Unable to tolerate weight bearing which will impact her ability to receive dialysis in outpatient setting. Nephrology already consulted for inpatient dialysis by ED provider. Will admit to observation for pain  control and further work-up for her knee pain while receiving inpatient dialysis.  Left hip/knee pain 2/2 osteoarthritis vs ligamental injury Most likely caused by exacerbation of osteoarthritis w/ loose bony spur in knee. X-ray show moderate tricompartmental arthrosis of left knee. Chart review shows she has been walking a lot lately to try to lose weight which may have brought on the exacerbation. Physical exam show low likelihood of ligamental injury but cannot rule it out. - MRI left knee - PT eval and treat - Norco/Vicodin 5-325mg  q6hr PRN - Would benefit from steroid injection if she f/u with sports medicine in outpatient setting  ESRD BUN 72,  K 4.6 CO2 16. Missed 3 dialysis sessions 2/2 knee pain. Recent infiltration of right arm AV fistula. Currently has tunneled catheter for dialysis. 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 Does not appear hypervolemic on exam. - c/w home meds: carvedilol 6.25mg  BID  Hx of Insomnia - c/w home meds: zolpidem 5mg  qhs  DVT prophx: subqheparin Diet: Renal Code: DNR  Dispo: Admit patient to Inpatient with expected length of stay greater than 2 midnights.  Signed: Mosetta Anis, MD 11/17/2018, 5:40 PM  Pager: [phone]   Internal Medicine Teaching Service Attending:   I saw and examined the patient. I reviewed the resident's note and I agree with the resident's findings and plan as documented in the resident's note.  I agree with the history and the exam as documented by the resident.  Principal Problem:   ESRD on hemodialysis [facility]) Active Problems:   Morbid obesity ([place])   Left knee pain  43 year old  woman living with end-stage renal disease initiated on hemodialysis recently through a tunneled right-sided IJ catheter, being admitted after missing 3 HD sessions due to new and severe left knee pain.  She was modestly volume overloaded, potassium okay, uremic with a metabolic acidosis.  Greatly appreciate nephrology consultation and managing with serial HD sessions to catch up.  The left knee pain is what is keeping her from stable outpatient dialysis right now.  She reports a history of bilateral knee osteoarthritis but has never felt any pain like this before.  Fairly acute onset 1 week ago without trauma or inciting factor.  On exam there is no warmth or redness of the knee, no pain with passive range of motion, there is extreme pain with active range of motion.  On point-of-care ultrasound there is a moderate suprapatellar bursa effusion.  Differential includes a flare of osteoarthritis, septic arthritis possible given hemodialysis, crystal arthritis also possible and seems more consistent with her time course.  Plan will be for arthrocentesis of the moderate joint effusion.  No signs of ligament injury or meniscal injury, so I do not think we need an MRI right now, can hold off.  I agree with supportive care, pain control, PT and OT to see if we can use devices like crutches, or walker, to help with her mobility.  Lalla Brothers, MD FACP

## 2018-11-17 NOTE — ED Triage Notes (Signed)
Pt has been having left leg pain for 4 days. Pt has missed dialysis Wednesday, Friday and today due to pain. 168/110, HR 80, 100% on room air. Denies fever. Hx of HTN. Pt able to bear weight, denies injury.

## 2018-11-17 NOTE — ED Notes (Signed)
Placed pure wick.

## 2018-11-17 NOTE — ED Notes (Signed)
Patient denies pain and is resting comfortably.  

## 2018-11-17 NOTE — Progress Notes (Signed)
Left lower ext venous  has been completed. Refer to [facility] under chart review to view preliminary results.   11/17/2018  3:37 PM Chamya Hunton, Bonnye Fava

## 2018-11-18 DIAGNOSIS — Z992 Dependence on renal dialysis: Secondary | ICD-10-CM | POA: Diagnosis not present

## 2018-11-18 DIAGNOSIS — Z66 Do not resuscitate: Secondary | ICD-10-CM | POA: Diagnosis not present

## 2018-11-18 DIAGNOSIS — M25462 Effusion, left knee: Secondary | ICD-10-CM | POA: Diagnosis present

## 2018-11-18 DIAGNOSIS — Z9115 Patient's noncompliance with renal dialysis: Secondary | ICD-10-CM | POA: Diagnosis not present

## 2018-11-18 DIAGNOSIS — M1712 Unilateral primary osteoarthritis, left knee: Secondary | ICD-10-CM | POA: Diagnosis present

## 2018-11-18 DIAGNOSIS — M79605 Pain in left leg: Secondary | ICD-10-CM | POA: Diagnosis not present

## 2018-11-18 DIAGNOSIS — E872 Acidosis: Secondary | ICD-10-CM | POA: Diagnosis present

## 2018-11-18 DIAGNOSIS — Z1159 Encounter for screening for other viral diseases: Secondary | ICD-10-CM | POA: Diagnosis not present

## 2018-11-18 DIAGNOSIS — G4700 Insomnia, unspecified: Secondary | ICD-10-CM | POA: Diagnosis present

## 2018-11-18 DIAGNOSIS — I5030 Unspecified diastolic (congestive) heart failure: Secondary | ICD-10-CM | POA: Diagnosis not present

## 2018-11-18 DIAGNOSIS — D631 Anemia in chronic kidney disease: Secondary | ICD-10-CM | POA: Diagnosis present

## 2018-11-18 DIAGNOSIS — M2342 Loose body in knee, left knee: Secondary | ICD-10-CM | POA: Diagnosis present

## 2018-11-18 DIAGNOSIS — G4733 Obstructive sleep apnea (adult) (pediatric): Secondary | ICD-10-CM

## 2018-11-18 DIAGNOSIS — Z8249 Family history of ischemic heart disease and other diseases of the circulatory system: Secondary | ICD-10-CM | POA: Diagnosis not present

## 2018-11-18 DIAGNOSIS — F17210 Nicotine dependence, cigarettes, uncomplicated: Secondary | ICD-10-CM | POA: Diagnosis present

## 2018-11-18 DIAGNOSIS — S83272A Complex tear of lateral meniscus, current injury, left knee, initial encounter: Secondary | ICD-10-CM | POA: Diagnosis present

## 2018-11-18 DIAGNOSIS — M25562 Pain in left knee: Secondary | ICD-10-CM

## 2018-11-18 DIAGNOSIS — N186 End stage renal disease: Secondary | ICD-10-CM | POA: Diagnosis present

## 2018-11-18 DIAGNOSIS — Z79899 Other long term (current) drug therapy: Secondary | ICD-10-CM | POA: Diagnosis not present

## 2018-11-18 DIAGNOSIS — I422 Other hypertrophic cardiomyopathy: Secondary | ICD-10-CM | POA: Diagnosis present

## 2018-11-18 DIAGNOSIS — I132 Hypertensive heart and chronic kidney disease with heart failure and with stage 5 chronic kidney disease, or end stage renal disease: Secondary | ICD-10-CM | POA: Diagnosis present

## 2018-11-18 DIAGNOSIS — Z560 Unemployment, unspecified: Secondary | ICD-10-CM | POA: Diagnosis not present

## 2018-11-18 DIAGNOSIS — M659 Synovitis and tenosynovitis, unspecified: Secondary | ICD-10-CM | POA: Diagnosis present

## 2018-11-18 DIAGNOSIS — M898X9 Other specified disorders of bone, unspecified site: Secondary | ICD-10-CM | POA: Diagnosis present

## 2018-11-18 DIAGNOSIS — X58XXXA Exposure to other specified factors, initial encounter: Secondary | ICD-10-CM | POA: Diagnosis present

## 2018-11-18 DIAGNOSIS — I5032 Chronic diastolic (congestive) heart failure: Secondary | ICD-10-CM | POA: Diagnosis present

## 2018-11-18 DIAGNOSIS — S83232A Complex tear of medial meniscus, current injury, left knee, initial encounter: Secondary | ICD-10-CM | POA: Diagnosis present

## 2018-11-18 LAB — HIV ANTIBODY (ROUTINE TESTING W REFLEX): HIV Screen 4th Generation wRfx: NONREACTIVE

## 2018-11-18 LAB — SYNOVIAL CELL COUNT + DIFF, W/ CRYSTALS
Crystals, Fluid: NONE SEEN
Eosinophils-Synovial: 0 % (ref 0–1)
Lymphocytes-Synovial Fld: 7 % (ref 0–20)
Monocyte-Macrophage-Synovial Fluid: 93 % — ABNORMAL HIGH (ref 50–90)
Neutrophil, Synovial: 0 % (ref 0–25)
WBC, Synovial: 955 /mm3 — ABNORMAL HIGH (ref 0–200)

## 2018-11-18 LAB — RENAL FUNCTION PANEL
Albumin: 3.3 g/dL — ABNORMAL LOW (ref 3.5–5.0)
Anion gap: 13 (ref 5–15)
BUN: 80 mg/dL — ABNORMAL HIGH (ref 6–20)
CO2: 15 mmol/L — ABNORMAL LOW (ref 22–32)
Calcium: 8.8 mg/dL — ABNORMAL LOW (ref 8.9–10.3)
Chloride: 113 mmol/L — ABNORMAL HIGH (ref 98–111)
Creatinine, Ser: 8.98 mg/dL — ABNORMAL HIGH (ref 0.44–1.00)
GFR calc Af Amer: 6 mL/min — ABNORMAL LOW (ref >60)
GFR calc non Af Amer: 5 mL/min — ABNORMAL LOW (ref >60)
Glucose, Bld: 95 mg/dL (ref 70–99)
Phosphorus: 8.2 mg/dL — ABNORMAL HIGH (ref 2.5–4.6)
Potassium: 4.6 mmol/L (ref 3.5–5.1)
Sodium: 141 mmol/L (ref 135–145)

## 2018-11-18 LAB — CBC
HCT: 29.2 % — ABNORMAL LOW (ref 36.0–46.0)
Hemoglobin: 9.5 g/dL — ABNORMAL LOW (ref 12.0–15.0)
MCH: 27.1 pg (ref 26.0–34.0)
MCHC: 32.5 g/dL (ref 30.0–36.0)
MCV: 83.4 fL (ref 80.0–100.0)
Platelets: 210 10*3/uL (ref 150–400)
RBC: 3.5 MIL/uL — ABNORMAL LOW (ref 3.87–5.11)
RDW: 17.2 % — ABNORMAL HIGH (ref 11.5–15.5)
WBC: 7.9 10*3/uL (ref 4.0–10.5)
nRBC: 0 % (ref 0.0–0.2)

## 2018-11-18 LAB — MRSA PCR SCREENING: MRSA by PCR: NEGATIVE

## 2018-11-18 LAB — CK: Total CK: 219 U/L (ref 38–234)

## 2018-11-18 LAB — CREATININE, SERUM
Creatinine, Ser: 9.2 mg/dL — ABNORMAL HIGH (ref 0.44–1.00)
GFR calc Af Amer: 5 mL/min — ABNORMAL LOW (ref >60)
GFR calc non Af Amer: 5 mL/min — ABNORMAL LOW (ref >60)

## 2018-11-18 LAB — NOVEL CORONAVIRUS, NAA (HOSP ORDER, SEND-OUT TO REF LAB; TAT 18-24 HRS): SARS-CoV-2, NAA: NOT DETECTED

## 2018-11-18 LAB — HEPATITIS B SURFACE ANTIGEN: Hepatitis B Surface Ag: NEGATIVE

## 2018-11-18 MED ORDER — ALTEPLASE 2 MG IJ SOLR: 2.0000 mg | Freq: Once | INTRAMUSCULAR | Status: DC | PRN

## 2018-11-18 MED ORDER — LIDOCAINE HCL (PF) 1 % IJ SOLN: 5.0000 mL | INTRAMUSCULAR | Status: DC | PRN

## 2018-11-18 MED ORDER — SODIUM CHLORIDE 0.9 % IV SOLN: 100.0000 mL | INTRAVENOUS | Status: DC | PRN

## 2018-11-18 MED ORDER — PENTAFLUOROPROP-TETRAFLUOROETH EX AERO: 1.0000 "application " | INHALATION_SPRAY | CUTANEOUS | Status: DC | PRN

## 2018-11-18 MED ORDER — HEPARIN SODIUM (PORCINE) 1000 UNIT/ML DIALYSIS: 6000.0000 [IU] | Freq: Once | INTRAMUSCULAR | Status: AC

## 2018-11-18 MED ORDER — LIDOCAINE-PRILOCAINE 2.5-2.5 % EX CREA: 1.0000 "application " | TOPICAL_CREAM | CUTANEOUS | Status: DC | PRN

## 2018-11-18 MED ORDER — HEPARIN SODIUM (PORCINE) 1000 UNIT/ML IJ SOLN
INTRAMUSCULAR | Status: AC
  Filled 2018-11-18: qty 1

## 2018-11-18 MED ORDER — HEPARIN SODIUM (PORCINE) 1000 UNIT/ML DIALYSIS: 1000.0000 [IU] | INTRAMUSCULAR | Status: DC | PRN

## 2018-11-18 NOTE — Progress Notes (Signed)
Knee Arthrocentesis without Injection Procedure Note  Diagnosis: left knee  Indications: Unexplained joint effusion  Anesthesia: Lidocaine 1% without epinephrine  Procedure Details   Point of care ultrasound was used to identify the joint effusion and plan needle trajectory and depth. Consent was obtained for the procedure. The joint was prepped with Betadine. A 22 gauge needle was inserted into the superior aspect of the joint from a medial approach to access the suprapatellar pouch. 15 ml of clear yellow fluid was removed from the joint and sent to the lab for analysis. 2 ml 1% lidocaine and 0 ml of Triamcinolone was then injected into the joint through the same needle. The needle was removed and the area cleansed and dressed.  Complications:  None; patient tolerated the procedure well.

## 2018-11-18 NOTE — Progress Notes (Signed)
  [facility] Progress Note   Assessment/ Plan:    Outpt HD Orders Unit: [facility] Days: M WF Time: 4 hours  Dialyzer: F-180 EDW: 130.5 kg K/Ca: 2/2 Access: TDC and right upper extremity aVF BFR/DFR: 300/800 UF Proflie: Profile 2 VDRA: Doxercalciferol 4 mcg q. treatment EPO: Mircera 100 mcg every 2 weeks, last administered 6/8 IV Fe: None currently, Heparin: 6000 units IV bolus  1.L knee pain- xrays without fracture, L hip xray negative, L knee with calcified loose body and tricompartmental arthrosis.  MRI today 2.ESRD Missed HD x 3.  HD today off schedule and then back to MWF schedule 3. Anemia: Hgb 9.5, not due for ESA yet, follow 4. CKD-MBD:: give hectorol 2 rx, binders when appropriate 5. Hypertension: on coreg only, BP fairly well-controlled 6.  Dispo: pending  Subjective:    Reports ongoing L knee pain.  Does not want to start cannulating fistula yet.    Objective:   BP (!) (P) 151/88 (BP Location: Left Arm)   Pulse (P) 79   Temp (P) 97.9 F (36.6 C) (Oral)   Resp 18   Ht 5\' 5"  (1.651 m)   Wt 133.4 kg   LMP 03/15/2017   SpO2 100%   BMI 48.94 kg/m   Physical Exam: GEN: NAD, on HD HEENT: wearing glasses, EOMI CV: RRR, normal S1 and S2 PULM: clear anteriorly ABD: soft, nontender SKIN: No rashes or lesions EXT: No significant edema in the lower extremities Vascular: Right upper arm tortuous negative fistula with bruit and thrill, R IJ TDC dressing c/d/i  Labs: BMET Recent Labs  Lab 11/17/18 1228 11/18/18 0022 11/18/18 0756  NA 143  --  141  K 4.6  --  4.6  CL 116*  --  113*  CO2 16*  --  15*  GLUCOSE 89  --  95  BUN 72*  --  80*  CREATININE 8.43* 9.20* 8.98*  CALCIUM 9.1  --  8.8*  PHOS  --   --  8.2*   CBC Recent Labs  Lab 11/17/18 1228 11/18/18 0755  WBC 8.0 7.9  NEUTROABS 6.0  --   HGB 10.3* 9.5*  HCT 31.8* 29.2*  MCV 83.7 83.4  PLT 232 210    @IMGRELPRIORS @ Medications:    . heparin      .  carvedilol  6.25 mg Oral BID  . Chlorhexidine Gluconate Cloth  6 each Topical Q0600  . [START ON 11/19/2018] doxercalciferol  4 mcg Intravenous Q M,W,F-HD  . heparin  5,000 Units Subcutaneous Q8H  . multivitamin  1 tablet Oral QHS     Madelon Lips, MD [facility] pgr [phone] 11/18/2018, 8:57 AM

## 2018-11-18 NOTE — Discharge Summary (Signed)
Name: Brittney Contreras MRN: [id number] DOB: [birth year] 43 y.o. PCP: Ladell Pier, MD  Date of Admission: 11/17/2018 12:15 PM Date of Discharge: 11/21/2018 Attending Physician: Lalla Brothers, MD  Discharge Diagnosis: 1. Left Knee Meniscus Tear 2. Osteoarthritis 3. End Stage Renal Disease  Discharge Medications: Allergies as of 11/21/2018   No Known Allergies     Medication List    STOP taking these medications   ferrous sulfate 325 (65 FE) MG tablet   furosemide 40 MG tablet Commonly known as: LASIX   hydrochlorothiazide 25 MG tablet Commonly known as: HYDRODIURIL     TAKE these medications   b complex-vitamin c-folic acid 0.8 MG Tabs tablet Take 1 tablet by mouth daily. Notes to patient: 11/21/18   carvedilol 6.25 MG tablet Commonly known as: COREG Take 1 tablet (6.25 mg total) by mouth 2 (two) times daily. Notes to patient: 11/21/18   DULoxetine 20 MG capsule Commonly known as: CYMBALTA Take 1 capsule (20 mg total) by mouth daily. Notes to patient: 11/21/18   HYDROmorphone 2 MG tablet Commonly known as: DILAUDID Take 1 tablet (2 mg total) by mouth every 6 (six) hours as needed for up to 5 days for severe pain.   zolpidem 5 MG tablet Commonly known as: AMBIEN Take 5 mg by mouth at bedtime as needed for sleep.       Disposition and follow-up:   Brittney Contreras was discharged from [facility] in Stable condition.  At the hospital follow up visit please address:  1. Left Knee Meniscus Tear/Osteoarthritis: - Admit for knee pain with findings of meniscus injury and arthritis - Ensure she f/u with outpatient orthopedics - Ensure she works with home PT to improve ambulatory function - D/c with duloxetine 20mg  daily, please titrate dose as needed  2. End Stage Renal Disease - Ensure she attends her scheduled dialysis sessions - Will need to transition from central catheter to AV Fistula for access  3.  Labs / imaging needed at time of  follow-up: N/A  4.  Pending labs/ test needing follow-up: N/A  Follow-up Appointments: Follow-up Information    Ladell Pier, MD. Call.   Specialty: Internal Medicine Contact information: [place] [address] [phone]        Leandrew Koyanagi, MD. Go on 12/09/2018.   Specialty: Orthopedic Surgery Why: 1:15 PM Contact information: [place] [address] [place], [place] Follow up.   Why: someone from [place] will call to schedule an appointment for physical therapy Contact information: [place] [phone]        [place] Follow up.   Why: provided rolling walker         Hospital Course by problem list: 1. Left Knee Meniscus Tear/Osteoarthritis:  Ms.Brittney Contreras is a 43 yo F w/ PMH of ESRD on HD (MWF), HTN, HFpEF (EF 55%) and OSA who presented to [facility] with complaints of left knee pain. She was unable to walk due to pain and missed multiple dialysis session. She was admitted for further work-up. X-ray showed evidence of arthritis but no fractures. Arthrocentesis was performed with no evidence of crystals or septic arthritis. MRI was performed showing evidence of tear in both lateral and medial meniscus. Ortho was consulted who performed steroid injection with mild improvement in knee pain. Discharged with duloxetine and short course of oral dilaudid with recommendation to f/u with  outpatient orthopedics for further management.  2. End Stage Renal Disease: Presented to [facility] after missing multiple dialysis sessions due to ambulatory dysfunction from knee pain. Noted to have had infiltration of right arm AV fistula which has been resolving per outpatient vascular surgeon. Nephrology was consulted who continued use of her tunneled catheter while serial dialysis was performed during hospitalization due to patient preference. Discharged with recommendation to f/u with  regular dialysis sessions and f/u with outpatient vascular surgery.  Discharge Vitals:   BP (!) 161/99 (BP Location: Left Wrist)   Pulse 78   Temp 97.8 F (36.6 C) (Oral)   Resp 16   Ht 5\' 5"  (1.651 m)   Wt 129.7 kg   LMP 03/15/2017   SpO2 97%   BMI 47.58 kg/m   Pertinent Labs, Studies, and Procedures:  BMP Latest Ref Rng & Units 11/21/2018 11/20/2018 11/19/2018  Glucose 70 - 99 mg/dL 102(H) 104(H) 143(H)  BUN 6 - 20 mg/dL 51(H) 27(H) 45(H)  Creatinine 0.44 - 1.00 mg/dL 7.68(H) 5.79(H) 7.35(H)  BUN/Creat Ratio 9 - 23 - - -  Sodium 135 - 145 mmol/L 136 137 137  Potassium 3.5 - 5.1 mmol/L 3.8 3.6 3.5  Chloride 98 - 111 mmol/L 100 99 101  CO2 22 - 32 mmol/L 21(L) 25 23  Calcium 8.9 - 10.3 mg/dL 8.9 8.8(L) 8.2(L)   PELVIS - 1-2 VIEW; LEFT FEMUR 2 VIEWS IMPRESSION: 1. No fracture or dislocation of the left hip or left femur. The partially imaged pelvis and proximal right femur are unremarkable in single AP view only.  2. Moderate tricompartmental arthrosis of the included left knee with a calcified loose body in the superior joint recess.  MRI OF THE LEFT KNEE WITHOUT CONTRAST IMPRESSION: 1. Complex tear of the lateral meniscus body and posterior horn with extrusion of the body. 2. Longitudinal tear of the medial meniscus posterior horn. 3. 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 4. 7 x 9 mm osteochondral lesion of the lateral trochlea. 5. Moderate joint effusion with synovitis and multiple small intra-articular bodies.  Discharge Instructions: Discharge Instructions    Call MD for:  persistant nausea and vomiting   Complete by: As directed    Call MD for:  redness, tenderness, or signs of infection (pain, swelling, redness, odor or green/yellow discharge around incision site)   Complete by: As directed    Call MD for:  severe uncontrolled pain   Complete by: As directed    Call MD for:  temperature >100.4   Complete by: As directed     Diet - low sodium heart healthy   Complete by: As directed    Discharge instructions   Complete by: As directed    Dear Brittney Contreras  You came to [place] with knee pain. We have determined this was caused by arthritis. Here are our recommendations for you at discharge:  Use ice packs, elevation at night and knee brace to aid with your joint pain Start duloxetine 20mg  daily for your knee pain Use dilaudid 2mg  1 tablet every 6 hours only as needed for severe exacerbation Follow up with orthopedics clinic  Thank you for choosing [facility].   Increase activity slowly   Complete by: As directed      Signed: Mosetta Anis, MD 11/22/2018, 7:32 AM   Pager: [phone]

## 2018-11-18 NOTE — Procedures (Signed)
Patient seen and examined on Hemodialysis. BP (!) (P) 151/88 (BP Location: Left Arm)   Pulse (P) 79   Temp (P) 97.9 F (36.6 C) (Oral)   Resp 18   Ht 5\' 5"  (1.651 m)   Wt 133.4 kg   LMP 03/15/2017   SpO2 100%   BMI 48.94 kg/m   QB 400 mL/ min via TDC, UF goal 2L.  AVF ready for use but pt is hesitant to try and cannulate in the hospital at this time.  Tolerating treatment without complaints at this time.   Madelon Lips MD [facility] pgr [phone] 9:03 AM

## 2018-11-18 NOTE — Progress Notes (Addendum)
Called Stacy under contact info and updated her on pt's condition. Call was transferred to pt's room so she could talk to [facility].  Paulla Fore, RN

## 2018-11-18 NOTE — Discharge Instructions (Signed)
Joint Pain  Joint pain can be caused by many things. It is likely to go away if you follow instructions from your doctor for taking care of yourself at home. Sometimes, you may need more treatment. Follow these instructions at home: Managing pain, stiffness, and swelling   If told, put ice on the painful area. ? Put ice in a plastic bag. ? Place a towel between your skin and the bag. ? Leave the ice on for 20 minutes, 2-3 times a day.  If told, put heat on the painful area. Do this as often as told by your doctor. Use the heat source that your doctor recommends, such as a moist heat pack or a heating pad. ? Place a towel between your skin and the heat source. ? Leave the heat on for 20-30 minutes. ? Take off the heat if your skin gets bright red. This is especially important if you are unable to feel pain, heat, or cold. You may have a greater risk of getting burned.  Move your fingers or toes below the painful joint often. This helps with stiffness and swelling.  If possible, raise (elevate) the painful joint above the level of your heart while you are sitting or lying down. To do this, try putting a few pillows under the painful joint. Activity  Rest the painful joint for as long as told. Do not do things that cause pain or make your pain worse.  Begin exercising or stretching the affected area, as told by your doctor. Ask your doctor what types of exercise are safe for you. If you have an elastic bandage, sling, or splint:  Wear the device as told by your doctor. Take it off only as told by your doctor.  Loosen the device if your fingers or toes below the joint: ? Tingle. ? Lose feeling (get numb). ? Get cold and blue.  Keep the device clean.  Ask your doctor if you should take off the device before bathing. You may need to cover it with a watertight covering when you take a bath or a shower. General instructions  Take over-the-counter and prescription medicines only as told  by your doctor.  Do not use any products that contain nicotine or tobacco. These include cigarettes and e-cigarettes. If you need help quitting, ask your doctor.  Keep all follow-up visits as told by your doctor. This is important. Contact a doctor if:  You have pain that gets worse and does not get better with medicine.  Your joint pain does not get better in 3 days.  You have more bruising or swelling.  You have a fever.  You lose 10 lb (4.5 kg) or more without trying. Get help right away if:  You cannot move the joint.  Your fingers or toes tingle, lose feeling, or get cold and blue.  You have a fever along with a joint that is red, warm, and swollen. Summary  Joint pain can be caused by many things. It often goes away if you follow instructions from your doctor for taking care of yourself at home.  Rest the painful joint for as long as told. Do not do things that cause pain or make your pain worse.  Take over-the-counter and prescription medicines only as told by your doctor. This information is not intended to replace advice given to you by your health care provider. Make sure you discuss any questions you have with your health care provider. Document Released: 05/09/2009 Document Revised: 03/06/2017 Document  Reviewed: 03/06/2017 Elsevier Interactive Patient Education  [employer].

## 2018-11-18 NOTE — Progress Notes (Signed)
   Subjective: Brittney Contreras was examined in the hemodialysis unit and reports of L. knee pain that is worse with change in bodily position. Pain is worse with active ROM but not so much with passive ROM. She reports that usually she would experience cramping pain each time in dialysis but this is different that her lag pain.  Objective:  Vital signs in last 24 hours: Vitals:   11/18/18 0900 11/18/18 0930 11/18/18 1000 11/18/18 1030  BP: (!) 133/91 133/85 133/79 138/81  Pulse: 78 86 86 89  Resp:      Temp:      TempSrc:      SpO2:      Weight:      Height:       General: A/O x4, in no acute distress, afebrile, nondiaphoretic Cardio: RRR, no mrg's  Pulmonary: CTA bilaterally, no wheezing or crackles  Abdomen: Bowel sounds normal, soft, nontender  MSK: BLE nontender, manipulation of the left leg yielded minimal pain with external and internal hip rotation, and passive flexion of the left knee Psych: Appropriate affect, not depressed in appearance, engages well  Assessment/Plan:  Active Problems:   Morbid obesity ([place])   ESRD on hemodialysis ([place])   Left leg pain  Brittney Contreras is a 43 year old female with past medical history significant for ESRD on HD TTS, HFpEF with EF of 55%, and OSA who presents with complaint of left leg pain for several days which has prevented her from attending her dialysis sessions.  She was admitted but ED provider due to concerns that she would continue to miss dialysis for further evaluation of the source of her leg pain.  A/P: Left leg pain: Felt most likely to be secondary to osteoarthritis exacerbated by obesity and recent increased use while attempting to lose weight.  No obvious ligamentous injuries on initial evaluation with knee stability intact.  No focal tenderness on exam.  Patient endorses pain most typically when standing that radiates from the foot to the hip.  No obvious deformity with manipulation of the hip or ankle.  X-rays of the femur  indicating tricompartmental part of the left knee. - MRI left knee ordered - We will complete bedside ultrasound of the left knee after HD -Continue Norco Vicodin 5/325 mg every 6 hours PRN for pain - Consider further outpatient evaluation pending results of testing  ESRD: BUN and creatinine elevated on admission as expected in ESRD patients but overall stable and absent encephalopathy.  No gross electrolyte abnormalities were noted with BMP relatively unchanged from prior aside from a bicarb of 16.  She had missed multiple dialysis sessions secondary to the knee pain.  Was undergoing HD today during evaluation. - Inpatient dialysis per nephrology - Continue avoiding nephrotoxic agents  DVT prophx: Heparin Diet: Renal  Code: DNR status confirmed Dispo: Anticipated discharge in approximately 0-1 day(s).   Brittney Ludwig, MD 11/18/2018, 11:44 AM Pager: [phone]

## 2018-11-18 NOTE — Progress Notes (Signed)
PT Cancellation Note  Patient Details Name: Brittney Contreras MRN: [id number] DOB: August 15, 1975   Cancelled Treatment:    Reason Eval/Treat Not Completed: Patient at procedure or test/unavailable At HD. Will follow-up as time allows   Lanney Gins, PT, DPT Supplemental Physical Therapist 11/18/18 7:50 AM Pager: [phone] Office: [phone]

## 2018-11-19 ENCOUNTER — Inpatient Hospital Stay (HOSPITAL_COMMUNITY): Payer: Medicaid Other

## 2018-11-19 DIAGNOSIS — M25462 Effusion, left knee: Secondary | ICD-10-CM

## 2018-11-19 LAB — CBC
HCT: 28.9 % — ABNORMAL LOW (ref 36.0–46.0)
Hemoglobin: 9.6 g/dL — ABNORMAL LOW (ref 12.0–15.0)
MCH: 26.9 pg (ref 26.0–34.0)
MCHC: 33.2 g/dL (ref 30.0–36.0)
MCV: 81 fL (ref 80.0–100.0)
Platelets: 151 10*3/uL (ref 150–400)
RBC: 3.57 MIL/uL — ABNORMAL LOW (ref 3.87–5.11)
RDW: 16.9 % — ABNORMAL HIGH (ref 11.5–15.5)
WBC: 7 10*3/uL (ref 4.0–10.5)
nRBC: 0 % (ref 0.0–0.2)

## 2018-11-19 LAB — GRAM STAIN

## 2018-11-19 LAB — RENAL FUNCTION PANEL
Albumin: 3.3 g/dL — ABNORMAL LOW (ref 3.5–5.0)
Anion gap: 13 (ref 5–15)
BUN: 45 mg/dL — ABNORMAL HIGH (ref 6–20)
CO2: 23 mmol/L (ref 22–32)
Calcium: 8.2 mg/dL — ABNORMAL LOW (ref 8.9–10.3)
Chloride: 101 mmol/L (ref 98–111)
Creatinine, Ser: 7.35 mg/dL — ABNORMAL HIGH (ref 0.44–1.00)
GFR calc Af Amer: 7 mL/min — ABNORMAL LOW (ref >60)
GFR calc non Af Amer: 6 mL/min — ABNORMAL LOW (ref >60)
Glucose, Bld: 143 mg/dL — ABNORMAL HIGH (ref 70–99)
Phosphorus: 6.4 mg/dL — ABNORMAL HIGH (ref 2.5–4.6)
Potassium: 3.5 mmol/L (ref 3.5–5.1)
Sodium: 137 mmol/L (ref 135–145)

## 2018-11-19 LAB — HEPATITIS B CORE ANTIBODY, TOTAL: Hep B Core Total Ab: NEGATIVE

## 2018-11-19 LAB — HEPATITIS B SURFACE ANTIBODY,QUALITATIVE: Hep B S Ab: NONREACTIVE

## 2018-11-19 LAB — PATHOLOGIST SMEAR REVIEW

## 2018-11-19 LAB — CK: Total CK: 163 U/L (ref 38–234)

## 2018-11-19 MED ORDER — HEPARIN SODIUM (PORCINE) 1000 UNIT/ML IJ SOLN
INTRAMUSCULAR | Status: AC
  Administered 2018-11-19: 3000 [IU] via ARTERIOVENOUS_FISTULA
  Filled 2018-11-19: qty 3

## 2018-11-19 MED ORDER — HYDROMORPHONE HCL 2 MG PO TABS
1.0000 mg | ORAL_TABLET | Freq: Four times a day (QID) | ORAL | Status: DC | PRN
  Administered 2018-11-20 – 2018-11-21 (×7): 1 mg via ORAL
  Filled 2018-11-19 (×8): qty 1

## 2018-11-19 MED ORDER — DOXERCALCIFEROL 4 MCG/2ML IV SOLN
INTRAVENOUS | Status: AC
  Administered 2018-11-19: 4 ug via INTRAVENOUS
  Filled 2018-11-19: qty 2

## 2018-11-19 MED ORDER — HEPARIN SODIUM (PORCINE) 1000 UNIT/ML IJ SOLN
INTRAMUSCULAR | Status: AC
  Administered 2018-11-19: 6000 [IU] via INTRAVENOUS_CENTRAL
  Filled 2018-11-19: qty 6

## 2018-11-19 MED ORDER — SEVELAMER CARBONATE 800 MG PO TABS
800.0000 mg | ORAL_TABLET | Freq: Three times a day (TID) | ORAL | Status: DC
  Administered 2018-11-19 – 2018-11-21 (×7): 800 mg via ORAL
  Filled 2018-11-19 (×7): qty 1

## 2018-11-19 NOTE — Evaluation (Signed)
Physical Therapy Evaluation Patient Details Name: Brittney Contreras MRN: [id number] DOB: 1975-08-22 Today's Date: 11/19/2018   History of Present Illness  [name] is a 43 year old female with past medical history significant for ESRD on HD TTS, HFpEF with EF of 55%, and OSA who presents with complaint of left leg pain for several days which has prevented her from attending her dialysis sessions.  She was admitted but ED provider due to concerns that she would continue to miss dialysis for further evaluation of the source of her leg pain.    Clinical Impression  Pt admitted with above diagnosis. Pt currently with functional limitations due to the deficits listed below (see PT Problem List). PTA, patient living with cousin and her fiance, on 2nd story apartment. Today, patient reports high levels of pain since attempting to stand this AM. Able to come to standing today with minimal guarding, however too painful to walk. Agrees she is not safe to return home at this time. Pt reports "clicking and catching" in knee. Pt will benefit from skilled PT to increase their independence and safety with mobility to allow discharge to the venue listed below.       Follow Up Recommendations Home health PT(Pending reduction in pain )    Equipment Recommendations  (TBD pending progress )    Recommendations for Other Services       Precautions / Restrictions Precautions Precautions: Fall Restrictions Weight Bearing Restrictions: No      Mobility  Bed Mobility Overal bed mobility: Modified Independent                Transfers Overall transfer level: Needs assistance   Transfers: Sit to/from Stand Sit to Stand: Min guard         General transfer comment: min G due to pain   Ambulation/Gait             General Gait Details: unable to safely ambulate due to pain   Stairs            Wheelchair Mobility    Modified Rankin (Stroke Patients Only)       Balance Overall  balance assessment: Needs assistance   Sitting balance-Leahy Scale: Good       Standing balance-Leahy Scale: Fair                               Pertinent Vitals/Pain Pain Assessment: Faces Faces Pain Scale: Hurts whole lot Pain Location: L knee Pain Descriptors / Indicators: Aching Pain Intervention(s): Limited activity within patient's tolerance    Home Living Family/patient expects to be discharged to:: Private residence Living Arrangements: Other relatives Available Help at Discharge: Family Type of Home: House Home Access: Level entry     Home Layout: Two level Home Equipment: Cane - single point      Prior Function Level of Independence: Independent;Independent with assistive device(s)         Comments: gait with SPC, I with ADLs     Hand Dominance   Dominant Hand: Right    Extremity/Trunk Assessment   Upper Extremity Assessment Upper Extremity Assessment: Overall WFL for tasks assessed    Lower Extremity Assessment Lower Extremity Assessment: (WNL strength, LLE limited testing due to pain)       Communication   Communication: No difficulties  Cognition Arousal/Alertness: Awake/alert Behavior During Therapy: WFL for tasks assessed/performed Overall Cognitive Status: Within Functional Limits for tasks assessed  General Comments      Exercises     Assessment/Plan    PT Assessment Patient needs continued PT services  PT Problem List Decreased strength       PT Treatment Interventions Stair training;DME instruction;Gait training;Functional mobility training;Therapeutic activities;Therapeutic exercise    PT Goals (Current goals can be found in the Care Plan section)  Acute Rehab PT Goals Patient Stated Goal: less pain  PT Goal Formulation: With patient Time For Goal Achievement: 12/03/18 Potential to Achieve Goals: Good    Frequency Min 3X/week   Barriers to  discharge Inaccessible home environment      Co-evaluation               AM-PAC PT "6 Clicks" Mobility  Outcome Measure Help needed turning from your back to your side while in a flat bed without using bedrails?: None Help needed moving from lying on your back to sitting on the side of a flat bed without using bedrails?: A Little Help needed moving to and from a bed to a chair (including a wheelchair)?: A Little Help needed standing up from a chair using your arms (e.g., wheelchair or bedside chair)?: A Little Help needed to walk in hospital room?: A Lot Help needed climbing 3-5 steps with a railing? : A Lot 6 Click Score: 17    End of Session Equipment Utilized During Treatment: Gait belt Activity Tolerance: Patient tolerated treatment well Patient left: in bed Nurse Communication: Mobility status PT Visit Diagnosis: Unsteadiness on feet (R26.81)    Time: 0786-7544 PT Time Calculation (min) (ACUTE ONLY): 23 min   Charges:   PT Evaluation $PT Eval Moderate Complexity: 1 Mod PT Treatments $Therapeutic Activity: 8-22 mins        Reinaldo Berber, PT, DPT Acute Rehabilitation Services Pager: [phone] Office: [phone]    Reinaldo Berber 11/19/2018, 11:18 AM

## 2018-11-19 NOTE — Progress Notes (Signed)
Renal Navigator notified OP HD clinic/NW of patient's admission and negative COVID 19 test in order to provide continuity of care and safety.  Alphonzo Cruise, [facility]al Navigator [phone]

## 2018-11-19 NOTE — Progress Notes (Signed)
   Subjective:  Brittney Contreras is a 43 y.o. F with PMH of morbid obesity, ESRD, OSA, HFpEF admit for leg pain on hospital day 1  Brittney Contreras was examined at bedside and she reports of persistent left knee pain. She states her knee pain does not appear to have significantly improved. She mentions she was able to ambulate yesterday with the walker and assistance of nursing staff with some pain. She does not feel safe to return home due to difficulty with ambulation. Denies any fevers, chills, nausea, vomiting.  Objective:  Vital signs in last 24 hours: Vitals:   11/18/18 1229 11/18/18 1755 11/18/18 2207 11/19/18 0556  BP: 125/90 108/73 (!) 141/91 136/81  Pulse: 96 97 86 78  Resp: (!) 26 19 16 15   Temp:  98.7 F (37.1 C) 97.6 F (36.4 C) 98.2 F (36.8 C)  TempSrc:  Oral Oral Oral  SpO2: 100% 100% 100% 98%  Weight:   128 kg   Height:       Physical Exam  Constitutional: She is oriented to person, place, and time. No distress.  Morbidly obese  Cardiovascular:  R arm AVF w/ good bruit  Pulmonary/Chest: Effort normal and breath sounds normal. She has no wheezes. She has no rales.  Musculoskeletal: Normal range of motion.        General: Tenderness (tenderness on lateral aspect of knee with standing and ambulation but no tenderness to palpation. Small palpable effusion. Passive and active range of motion  intact. No crepitus on flexion,extension) present.  Neurological: She is alert and oriented to person, place, and time.    Assessment/Plan:  Principal Problem:   ESRD on hemodialysis [facility]) Active Problems:   Morbid obesity ([place])   Left knee pain  Brittney Contreras is a 43 y.o. F with PMH of morbid obesity, ESRD, OSA, HFpEF admit for left knee pain 2/2 osteoarthritis vs meniscual injury. Arthrocentesis performed yesterday was negative for crystals, blood or significant leukocytosis, ruling out crystal arthropathy, hemarthrosis or septic arthritis. Likely to be due to osteoarthritis  exacerbation, but pain out of proportion to presumed diagnosis. Will do further work-up with MRI. She may have improvement with steroid injection and close follow up in outpatient setting.   Left leg pain 2/2 osteoarthritis vs meniscual injury Arthrocentesis results: WBC 955, no crystals, no neutrophils. CK 163. X-ray show tricompartmental arthrosis. No obvious ligament pathology on exam. - F/u MRI left knee - C/w oral Dilaudid 1mg  q6hr PRN   End Stage Renal Disease Received dialysis yesterday. Still using central catheter despite recent vascular surgery note mentioning AVF ready for use. - inpatient dialysis per nephro - Avoid nephrotoxic meds  DVT prophx: subqheparin Diet: Renal Code: DNR  Dispo: Anticipated discharge in approximately 0-1 day(s).   Mosetta Anis, MD 11/19/2018, 6:31 AM Pager: [phone]

## 2018-11-19 NOTE — Progress Notes (Signed)
  [facility] Progress Note   Assessment/ Plan:    Outpt HD Orders Unit: [facility] Days: M WF Time: 4 hours  Dialyzer: F-180 EDW: 130.5 kg K/Ca: 2/2 Access: TDC and right upper extremity aVF BFR/DFR: 300/800 UF Proflie: Profile 2 VDRA: Doxercalciferol 4 mcg q. treatment EPO: Mircera 100 mcg every 2 weeks, last administered 6/8 IV Fe: None currently, Heparin: 6000 units IV bolus  1.L knee pain- xrays without fracture, L hip xray negative, L knee with calcified loose body and tricompartmental arthrosis.  S/p arthrocentesis 6/16 - synovial fluid clear. Fluid studies pending. For MRI  2.ESRD Missed HD x 3.  HD today off schedule yesterday.  Back on schedule today.  3. Anemia: Hgb 9.5, not due for ESA yet, follow 4. CKD-MBD:: give hectorol 2 rx. Phos elevated. Start Renvela here.  5. Hypertension: on coreg only, BP fairly well-controlled 6.  Dispo: pending  Subjective:    Reports ongoing L knee pain.  Does not want to start cannulating fistula yet.    Objective:   BP (!) 145/90 (BP Location: Left Arm)   Pulse 80   Temp 98.2 F (36.8 C) (Oral)   Resp 15   Ht 5\' 5"  (1.651 m)   Wt 128 kg   LMP 03/15/2017   SpO2 99%   BMI 46.96 kg/m   Physical Exam: GEN: NAD, on HD HEENT: wearing glasses, EOMI CV: RRR, normal S1 and S2 PULM: clear anteriorly ABD: soft, nontender SKIN: No rashes or lesions EXT: No significant edema in the lower extremities Vascular: Right upper arm tortuous negative fistula with bruit and thrill, R IJ TDC dressing c/d/i  Labs: BMET Recent Labs  Lab 11/17/18 1228 11/18/18 0022 11/18/18 0756  NA 143  --  141  K 4.6  --  4.6  CL 116*  --  113*  CO2 16*  --  15*  GLUCOSE 89  --  95  BUN 72*  --  80*  CREATININE 8.43* 9.20* 8.98*  CALCIUM 9.1  --  8.8*  PHOS  --   --  8.2*   CBC Recent Labs  Lab 11/17/18 1228 11/18/18 0755  WBC 8.0 7.9  NEUTROABS 6.0  --   HGB 10.3* 9.5*  HCT 31.8* 29.2*  MCV 83.7 83.4  PLT  232 210    @IMGRELPRIORS @ Medications:    . carvedilol  6.25 mg Oral BID  . Chlorhexidine Gluconate Cloth  6 each Topical Q0600  . doxercalciferol  4 mcg Intravenous Q M,W,F-HD  . heparin  5,000 Units Subcutaneous Q8H  . multivitamin  1 tablet Oral QHS     Lynnda Child PA-C [facility] Pager [phone] 11/19/2018,10:17 AM

## 2018-11-20 ENCOUNTER — Inpatient Hospital Stay (HOSPITAL_COMMUNITY): Payer: Medicaid Other

## 2018-11-20 DIAGNOSIS — X58XXXA Exposure to other specified factors, initial encounter: Secondary | ICD-10-CM

## 2018-11-20 DIAGNOSIS — M1712 Unilateral primary osteoarthritis, left knee: Secondary | ICD-10-CM

## 2018-11-20 DIAGNOSIS — N186 End stage renal disease: Secondary | ICD-10-CM

## 2018-11-20 DIAGNOSIS — Z992 Dependence on renal dialysis: Secondary | ICD-10-CM

## 2018-11-20 DIAGNOSIS — S83272A Complex tear of lateral meniscus, current injury, left knee, initial encounter: Secondary | ICD-10-CM

## 2018-11-20 LAB — RENAL FUNCTION PANEL
Albumin: 3.3 g/dL — ABNORMAL LOW (ref 3.5–5.0)
Anion gap: 13 (ref 5–15)
BUN: 27 mg/dL — ABNORMAL HIGH (ref 6–20)
CO2: 25 mmol/L (ref 22–32)
Calcium: 8.8 mg/dL — ABNORMAL LOW (ref 8.9–10.3)
Chloride: 99 mmol/L (ref 98–111)
Creatinine, Ser: 5.79 mg/dL — ABNORMAL HIGH (ref 0.44–1.00)
GFR calc Af Amer: 9 mL/min — ABNORMAL LOW (ref >60)
GFR calc non Af Amer: 8 mL/min — ABNORMAL LOW (ref >60)
Glucose, Bld: 104 mg/dL — ABNORMAL HIGH (ref 70–99)
Phosphorus: 4.4 mg/dL (ref 2.5–4.6)
Potassium: 3.6 mmol/L (ref 3.5–5.1)
Sodium: 137 mmol/L (ref 135–145)

## 2018-11-20 MED ORDER — METHYLPREDNISOLONE ACETATE 80 MG/ML IJ SUSP
80.0000 mg | Freq: Once | INTRAMUSCULAR | Status: AC
  Administered 2018-11-20: 80 mg via INTRA_ARTICULAR
  Filled 2018-11-20: qty 1

## 2018-11-20 MED ORDER — BUPIVACAINE HCL (PF) 0.5 % IJ SOLN
10.0000 mL | Freq: Once | INTRAMUSCULAR | Status: AC
  Administered 2018-11-20: 10 mL
  Filled 2018-11-20: qty 10

## 2018-11-20 NOTE — Plan of Care (Signed)
  Problem: Activity: Goal: Risk for activity intolerance will decrease Outcome: Not Progressing   

## 2018-11-20 NOTE — Procedures (Signed)
Procedure: Left knee aspiration and injection  Indication: Left knee effusion(s)  Surgeon: Silvestre Gunner, PA-C  Assist: None  Anesthesia: None  EBL: None  Complications: None  Findings: After risks/benefits explained patient desires to undergo procedure. Consent obtained and time out performed. The left knee was sterilely prepped and aspirated. Minimal fluid obtained. 36ml 0.5% Marcaine and 80mg  depomedrol instilled. Pt tolerated the procedure well.    Lisette Abu, PA-C Orthopedic Surgery [phone]

## 2018-11-20 NOTE — Progress Notes (Signed)
  [facility] Progress Note   Assessment/ Plan:    Outpt HD Orders Unit: [facility] Days: M WF Time: 4 hours  Dialyzer: F-180 EDW: 130.5 kg K/Ca: 2/2 Access: TDC and right upper extremity aVF BFR/DFR: 300/800 UF Proflie: Profile 2 VDRA: Doxercalciferol 4 mcg q. treatment EPO: Mircera 100 mcg every 2 weeks, last administered 6/8 IV Fe: None currently, Heparin: 6000 units IV bolus  1.L knee pain- xrays without fracture, L hip xray negative, L knee with calcified loose body and tricompartmental arthrosis.  S/p arthrocentesis 6/16 - synovial fluid clear. Fluid studies pending. MRI showing complex tear of the lateral meniscus body and posterior horn with extrusion of the body - per ortho  2.ESRD - HD MWF.  Missed HD x 3. Now back on schedule. Next HD 6/19. K+ 3.5 Use 4K bath  3. Anemia: Hgb 9.5, not due for ESA yet, follow 4. CKD-MBD:: give hectorol 2 rx. Phos elevated. Start Renvela here.  5. Hypertension: on coreg only, BP fairly well-controlled 6.  Dispo: pending  Subjective:    Ongoing L knee pain - worse after walking to bathroom     Objective:   BP 138/81 (BP Location: Left Arm)   Pulse 77   Temp 98.9 F (37.2 C) (Oral)   Resp 19   Ht 5\' 5"  (1.651 m)   Wt 131.7 kg   LMP 03/15/2017   SpO2 96%   BMI 48.32 kg/m   Physical Exam: GEN: NAD, on HD HEENT: wearing glasses, EOMI CV: RRR, normal S1 and S2 PULM: clear anteriorly ABD: soft, nontender SKIN: No rashes or lesions EXT: No significant edema in the lower extremities Vascular: Right upper arm tortuous negative fistula with bruit and thrill, R IJ TDC dressing c/d/i  Labs: BMET Recent Labs  Lab 11/17/18 1228 11/18/18 0022 11/18/18 0756 11/19/18 1007  NA 143  --  141 137  K 4.6  --  4.6 3.5  CL 116*  --  113* 101  CO2 16*  --  15* 23  GLUCOSE 89  --  95 143*  BUN 72*  --  80* 45*  CREATININE 8.43* 9.20* 8.98* 7.35*  CALCIUM 9.1  --  8.8* 8.2*  PHOS  --   --  8.2* 6.4*    CBC Recent Labs  Lab 11/17/18 1228 11/18/18 0755 11/19/18 1007  WBC 8.0 7.9 7.0  NEUTROABS 6.0  --   --   HGB 10.3* 9.5* 9.6*  HCT 31.8* 29.2* 28.9*  MCV 83.7 83.4 81.0  PLT 232 210 151    @IMGRELPRIORS @ Medications:    . carvedilol  6.25 mg Oral BID  . Chlorhexidine Gluconate Cloth  6 each Topical Q0600  . doxercalciferol  4 mcg Intravenous Q M,W,F-HD  . heparin  5,000 Units Subcutaneous Q8H  . multivitamin  1 tablet Oral QHS  . sevelamer carbonate  800 mg Oral TID WC     Lynnda Child PA-C [facility] Pager [phone] 11/20/2018,9:07 AM

## 2018-11-20 NOTE — Consult Note (Signed)
Reason for Consult:Left knee pain Referring Physician: D Aubryn Spinola A Contreras is an 43 y.o. female.  HPI: Brittney Contreras was in her usual state of health when she woke up last Tuesday (6/9) with left knee pain. This was bad enough that she could not ambulate. Her family had left for the beach and she was unable to get to HD. She notes the pain has been constant and has really gotten no worse or better. It mainly bothers her with weight bearing. She denies any popping, clicking, or locking. She denies prior problems with that knee and denies hx/o gout. Joint aspiration 2d ago showed no crystals and no organisms with a relatively low WBC.  Past Medical History:  Diagnosis Date  . Anemia   . Chronic kidney disease   . Diastolic heart failure: Grade 3  07/25/2012  . Hypertension   . Hypertensive hypertrophic cardiomyopathy ([place]) 07/28/2012  . Malignant hypertension with congestive heart failure ([place]) 07/25/2012  . Obesity, morbid ([place]) 07/25/2012  . Obstructive sleep apnea   . Pneumonia   . Tobacco abuse 07/27/2012    Past Surgical History:  Procedure Laterality Date  . AV FISTULA PLACEMENT Right 08/01/2012   Procedure: ARTERIOVENOUS (AV) FISTULA CREATION;  Surgeon: Mal Misty, MD;  Location: [place];  Service: Vascular;  Laterality: Right;  Ultrasound guided; Attempted wrist AV Fistula Creation.  . AV FISTULA PLACEMENT Right 07/2012  . INSERTION OF DIALYSIS CATHETER Right 09/19/2018   Procedure: INSERTION OF DIALYSIS CATHETER;  Surgeon: [name], MD;  Location: [place];  Service: Vascular;  Laterality: Right;  . LIGATION OF COMPETING BRANCHES OF ARTERIOVENOUS FISTULA Right 11/05/2012   Procedure: LIGATION OF COMPETING BRANCHES OF ARTERIOVENOUS FISTULA;  Surgeon: Mal Misty, MD;  Location: [place];  Service: Vascular;  Laterality: Right;  . SHUNTOGRAM Right 12/30/2012   Procedure: FISTULOGRAM;  Surgeon: Serafina Mitchell, MD;  Location: [facility] C[facility] LAB;  Service: Cardiovascular;  Laterality:  Right;    Family History  Problem Relation Age of Onset  . Hypertension Mother   . Heart failure Mother   . Prostate cancer Father   . Kidney disease Other        Aunt, deceased, had been on dialysis    Social History:  reports that she quit smoking about 2 years ago. Her smoking use included cigarettes. She has a 6.00 pack-year smoking history. She has never used smokeless tobacco. She reports that she does not drink alcohol or use drugs.  Allergies: No Known Allergies  Medications: I have reviewed the patient's current medications.  Results for orders placed or performed during the hospital encounter of 11/17/18 (from the past 48 hour(s))  Synovial cell count + diff, w/ crystals     Status: Abnormal   Collection Time: 11/18/18  2:34 PM  Result Value Ref Range   Color, Synovial YELLOW (A) YELLOW   Appearance-Synovial HAZY (A) CLEAR   Crystals, Fluid NO CRYSTALS SEEN    WBC, Synovial 955 (H) 0 - 200 /cu mm   Neutrophil, Synovial 0 0 - 25 %   Lymphocytes-Synovial Fld 7 0 - 20 %   Monocyte-Macrophage-Synovial Fluid 93 (H) 50 - 90 %   Eosinophils-Synovial 0 0 - 1 %    Comment: Performed at [facility] [address] [place], [place] [address]  Pathologist smear review     Status: None   Collection Time: 11/18/18  2:34 PM  Result Value Ref Range   Path Review MONONUCLEAR CELLS  Comment: BLOODY SPECIMEN Reviewed by Charolett Bumpers. Brittney Contreras, M.Brittney. 11/19/2018 Performed at [facility], [place] [address], [place], [place] [address]   CBC     Status: Abnormal   Collection Time: 11/19/18 10:07 AM  Result Value Ref Range   WBC 7.0 4.0 - 10.5 K/uL   RBC 3.57 (L) 3.87 - 5.11 MIL/uL   Hemoglobin 9.6 (L) 12.0 - 15.0 g/dL   HCT 28.9 (L) 36.0 - 46.0 %   MCV 81.0 80.0 - 100.0 fL   MCH 26.9 26.0 - 34.0 pg   MCHC 33.2 30.0 - 36.0 g/dL   RDW 16.9 (H) 11.5 - 15.5 %   Platelets 151 150 - 400 K/uL   nRBC 0.0 0.0 - 0.2 %    Comment: Performed at [facility], [place]  [address], [place], [place] [address]  Renal function panel     Status: Abnormal   Collection Time: 11/19/18 10:07 AM  Result Value Ref Range   Sodium 137 135 - 145 mmol/L   Potassium 3.5 3.5 - 5.1 mmol/L   Chloride 101 98 - 111 mmol/L   CO2 23 22 - 32 mmol/L   Glucose, Bld 143 (H) 70 - 99 mg/dL   BUN 45 (H) 6 - 20 mg/dL   Creatinine, Ser 7.35 (H) 0.44 - 1.00 mg/dL   Calcium 8.2 (L) 8.9 - 10.3 mg/dL   Phosphorus 6.4 (H) 2.5 - 4.6 mg/dL   Albumin 3.3 (L) 3.5 - 5.0 g/dL   GFR calc non Af Amer 6 (L) >60 mL/min   GFR calc Af Amer 7 (L) >60 mL/min   Anion gap 13 5 - 15    Comment: Performed at [facility] [address] [place], [place] [address]  CK     Status: None   Collection Time: 11/19/18 10:07 AM  Result Value Ref Range   Total CK 163 38 - 234 U/L    Comment: Performed at [facility], [place] [address] [place], [place] [address]  Renal function panel     Status: Abnormal   Collection Time: 11/20/18  9:56 AM  Result Value Ref Range   Sodium 137 135 - 145 mmol/L   Potassium 3.6 3.5 - 5.1 mmol/L   Chloride 99 98 - 111 mmol/L   CO2 25 22 - 32 mmol/L   Glucose, Bld 104 (H) 70 - 99 mg/dL   BUN 27 (H) 6 - 20 mg/dL   Creatinine, Ser 5.79 (H) 0.44 - 1.00 mg/dL   Calcium 8.8 (L) 8.9 - 10.3 mg/dL   Phosphorus 4.4 2.5 - 4.6 mg/dL   Albumin 3.3 (L) 3.5 - 5.0 g/dL   GFR calc non Af Amer 8 (L) >60 mL/min   GFR calc Af Amer 9 (L) >60 mL/min   Anion gap 13 5 - 15    Comment: Performed at [facility] [address], [place], [place] [address]    Mr Knee Left Wo Contrast  Result Date: 11/20/2018 CLINICAL DATA:  Chronic severe left knee pain.  No injury. EXAM: MRI OF THE LEFT KNEE WITHOUT CONTRAST TECHNIQUE: Multiplanar, multisequence MR imaging of the knee was performed. No intravenous contrast was administered. COMPARISON:  Left femur x-rays dated November 17, 2018. FINDINGS: MENISCI Medial meniscus:  Longitudinal tear of the posterior horn. Lateral meniscus: Complex tear of the  body and posterior horn with extrusion of the body. LIGAMENTS Cruciates: Intact ACL and PCL. Mild mucoid degeneration of the ACL. Collaterals: Medial  collateral ligament is intact. Lateral collateral ligament complex is intact. CARTILAGE Patellofemoral: Diffuse cartilage thinning with 7 x 9 mm osteochondral lesion of the lateral trochlea. Medial: Diffuse thinning with near full-thickness cartilage loss over the central weight-bearing medial femoral condyle. Lateral: Essentially absent cartilage throughout the lateral compartment. Joint: Moderate joint effusion with synovitis and multiple small intra-articular bodies. Normal Hoffa's fat. Popliteal Fossa:  No Baker cyst. Intact popliteus tendon. Extensor Mechanism: Intact quadriceps tendon and patellar tendon. Intact medial and lateral patellar retinaculum. Intact MPFL. Bones: No acute fracture or dislocation. Subchondral marrow edema and cystic change in the lateral compartment. No suspicious bone lesion. Other: None. IMPRESSION: 1. Complex tear of the lateral meniscus body and posterior horn with extrusion of the body. 2. Longitudinal tear of the medial meniscus posterior horn. 3. 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 4. 7 x 9 mm osteochondral lesion of the lateral trochlea. 5. Moderate joint effusion with synovitis and multiple small intra-articular bodies. Electronically Signed   By: Titus Dubin M.Brittney.   On: 11/20/2018 08:17    Review of Systems  Constitutional: Negative for chills, fever and weight loss.  HENT: Negative for ear discharge, ear pain, hearing loss and tinnitus.   Eyes: Negative for blurred vision, double vision, photophobia and pain.  Respiratory: Negative for cough, sputum production and shortness of breath.   Cardiovascular: Negative for chest pain.  Gastrointestinal: Negative for abdominal pain, nausea and vomiting.  Genitourinary: Negative for dysuria, flank pain, frequency and urgency.   Musculoskeletal: Positive for joint pain (Left knee). Negative for back pain, falls, myalgias and neck pain.  Neurological: Negative for dizziness, tingling, sensory change, focal weakness, loss of consciousness and headaches.  Endo/Heme/Allergies: Does not bruise/bleed easily.  Psychiatric/Behavioral: Negative for depression, memory loss and substance abuse. The patient is not nervous/anxious.    Blood pressure 138/81, pulse 77, temperature 98.9 F (37.2 C), temperature source Oral, resp. rate 19, height 5\' 5"  (1.651 m), weight 131.7 kg, last menstrual period 03/15/2017, SpO2 96 %. Physical Exam  Constitutional: She appears well-developed and well-nourished. No distress.  HENT:  Head: Normocephalic and atraumatic.  Eyes: Conjunctivae are normal. Right eye exhibits no discharge. Left eye exhibits no discharge. No scleral icterus.  Neck: Normal range of motion.  Cardiovascular: Normal rate and regular rhythm.  Respiratory: Effort normal. No respiratory distress.  Musculoskeletal:     Comments: LLE No traumatic wounds, ecchymosis, or rash  Medial knee joint line TTP  Mild knee effusion, no ankle effusion  Knee stable to varus/ valgus and anterior/posterior stress, able to PROM with minimal to no pain  Sens DPN, SPN, TN intact  Motor EHL, ext, flex, evers 5/5  DP 2+, PT 2+, No significant edema  Neurological: She is alert.  Skin: Skin is warm and dry. She is not diaphoretic.  Psychiatric: She has a normal mood and affect. Her behavior is normal.    Assessment/Plan: Left knee pain -- I suspect this is an exacerbation of her OA. Will proved steroid injection to see if that will benefit her. She may be WBAT and should f/u with Dr. Erlinda Hong in a month or so.    Lisette Abu, PA-C Orthopedic Surgery [phone] 11/20/2018, 11:53 AM

## 2018-11-20 NOTE — Progress Notes (Signed)
   Subjective:  Idalie A Yaffe is a 43 y.o. F with PMH of morbid obesity, ESRD, OSA, HFpEF admit for leg pain on hospital day 2  Ms Elting was examined at bedside this morning and was initially sleeping but woke up for assessment. She is not able to ambulate long distances without experiencing pain. We were able to relay the MRI findings to her and she expressed understanding. Her knee pain is practically unchanged since admission. She is willing to work with physical therapy today. Made her aware she will be evaluted by orthopedic surgery today.   Objective:  Vital signs in last 24 hours: Vitals:   11/19/18 2200 11/19/18 2223 11/19/18 2306 11/20/18 0541  BP: 114/74 127/84 (!) 141/90 138/81  Pulse: 93 93 89 77  Resp: 15 15 14 19   Temp:  98.5 F (36.9 C) 99.1 F (37.3 C) 98.9 F (37.2 C)  TempSrc:  Oral Oral Oral  SpO2:  98% 100% 96%  Weight:  129 kg 131.7 kg   Height:       Gen: Well-developed, morbidly obese, NAD Neck: supple, ROM intact, dialysis catheter intact without surrounding erythema, edema or drainage CV: RRR, S1, S2 normal, No rubs, no murmurs, no gallops, R arm AVF w/ good bruit Extm: ROM intact, Peripheral pulses intact, no peripheral edema, crepitus on flexion of left knee, tenderness with passive range of motion, no palpable effusion. Skin: Dry, Warm, normal turgor  Assessment/Plan:  Principal Problem:   ESRD on hemodialysis ([facility]) Active Problems:   Morbid obesity ([place])   Left knee pain  Staria A Ashbaugh is a 43 y.o. F with PMH of morbid obesity, ESRD, OSA, HFpEF admit for left knee pain 2/2 meniscual tear. MRI of her left knee showed evidence of significant meniscual tear as well as a 7x23mm osteochondral lesion in addition to osteoarthritis noted on prior X-ray. Continues to endorse pain out of proportion with exam but unable to discharge safely home as pain interferes with ambulation and she cannot attend dialysis regularly. Will consult ortho for recommendations  for assistance with possible surgical management.  Left leg pain 2/2 meniscual tear  MRI L knee: Complex tear of lateral meniscus body and posterior horn w/ extrusion of the body. Longitudinal tear of medial meniscus posterior horn. 7x46mm osteochondral lesion of lateral trochlea. Moderate joint effusion w/ small intra-articular bodies. CK 163 ruled out myositis. PT agree that she needs reduction in pain before she can be safely discharged home. - Ortho consult - C/w oral Dilaudid 1mg  q6hr PRN   End Stage Renal Disease - inpatient dialysis per nephro - Avoid nephrotoxic meds  DVT prophx: subqheparin Diet: Renal Code: DNR  Dispo: Anticipated discharge in approximately 1-2 day(s).   Mosetta Anis, MD 11/20/2018, 6:50 AM Pager: [phone]

## 2018-11-20 NOTE — Progress Notes (Signed)
Called [facility],leave a voice mail message.

## 2018-11-20 NOTE — Progress Notes (Signed)
Physical Therapy Treatment Patient Details Name: Brittney Contreras MRN: [id number] DOB: July 26, 1975 Today's Date: 11/20/2018    History of Present Illness Ms. Piatkowski is a 43 year old female with past medical history significant for ESRD on HD TTS, HFpEF with EF of 55%, and OSA who presents with complaint of left leg pain for several days which has prevented her from attending her dialysis sessions.  She was admitted but ED provider due to concerns that she would continue to miss dialysis for further evaluation of the source of her leg pain.    PT Comments    Patient limited again today by pain, agreeable to bed level therex and mobility. Worked on scooting and therex. Per MRI 6/18 meniscal tears, will await plan from ortho and update recs if needed.  At this time patient is too painful to ambulate.     Follow Up Recommendations  Home health PT(Pending reduction in pain )     Equipment Recommendations  (TBD pending progress )    Recommendations for Other Services       Precautions / Restrictions Precautions Precautions: Fall Restrictions Weight Bearing Restrictions: No    Mobility  Bed Mobility Overal bed mobility: Modified Independent                Transfers Overall transfer level: Needs assistance   Transfers: Sit to/from Stand Sit to Stand: Min guard         General transfer comment: min G due to pain worked on lateral scooting in bed up and down up as too painful to stand today  Ambulation/Gait             General Gait Details: unable to safely ambulate due to pain    Stairs             Wheelchair Mobility    Modified Rankin (Stroke Patients Only)       Balance Overall balance assessment: Needs assistance   Sitting balance-Leahy Scale: Good       Standing balance-Leahy Scale: Fair                              Cognition Arousal/Alertness: Awake/alert Behavior During Therapy: WFL for tasks assessed/performed Overall  Cognitive Status: Within Functional Limits for tasks assessed                                        Exercises      General Comments        Pertinent Vitals/Pain Faces Pain Scale: Hurts whole lot Pain Location: L knee Pain Descriptors / Indicators: Aching    Home Living                      Prior Function            PT Goals (current goals can now be found in the care plan section) Acute Rehab PT Goals Patient Stated Goal: less pain  PT Goal Formulation: With patient Time For Goal Achievement: 12/03/18 Potential to Achieve Goals: Good    Frequency    Min 3X/week      PT Plan      Co-evaluation              AM-PAC PT "6 Clicks" Mobility   Outcome Measure  Help needed turning from your back to your side while  in a flat bed without using bedrails?: None Help needed moving from lying on your back to sitting on the side of a flat bed without using bedrails?: A Little Help needed moving to and from a bed to a chair (including a wheelchair)?: A Little Help needed standing up from a chair using your arms (e.g., wheelchair or bedside chair)?: A Little Help needed to walk in hospital room?: A Lot Help needed climbing 3-5 steps with a railing? : A Lot 6 Click Score: 17    End of Session Equipment Utilized During Treatment: Gait belt Activity Tolerance: Patient tolerated treatment well Patient left: in bed Nurse Communication: Mobility status PT Visit Diagnosis: Unsteadiness on feet (R26.81)     Time: 3005-1102 PT Time Calculation (min) (ACUTE ONLY): 19 min  Charges:  $Therapeutic Activity: 8-22 mins                     Reinaldo Berber, PT, DPT Acute Rehabilitation Services Pager: [phone] Office: [phone]     Reinaldo Berber 11/20/2018, 12:50 PM

## 2018-11-21 DIAGNOSIS — I132 Hypertensive heart and chronic kidney disease with heart failure and with stage 5 chronic kidney disease, or end stage renal disease: Secondary | ICD-10-CM

## 2018-11-21 DIAGNOSIS — Z6841 Body Mass Index (BMI) 40.0 and over, adult: Secondary | ICD-10-CM

## 2018-11-21 LAB — RENAL FUNCTION PANEL
Albumin: 3.2 g/dL — ABNORMAL LOW (ref 3.5–5.0)
Anion gap: 15 (ref 5–15)
BUN: 51 mg/dL — ABNORMAL HIGH (ref 6–20)
CO2: 21 mmol/L — ABNORMAL LOW (ref 22–32)
Calcium: 8.9 mg/dL (ref 8.9–10.3)
Chloride: 100 mmol/L (ref 98–111)
Creatinine, Ser: 7.68 mg/dL — ABNORMAL HIGH (ref 0.44–1.00)
GFR calc Af Amer: 7 mL/min — ABNORMAL LOW (ref >60)
GFR calc non Af Amer: 6 mL/min — ABNORMAL LOW (ref >60)
Glucose, Bld: 102 mg/dL — ABNORMAL HIGH (ref 70–99)
Phosphorus: 6.5 mg/dL — ABNORMAL HIGH (ref 2.5–4.6)
Potassium: 3.8 mmol/L (ref 3.5–5.1)
Sodium: 136 mmol/L (ref 135–145)

## 2018-11-21 LAB — CBC
HCT: 26.6 % — ABNORMAL LOW (ref 36.0–46.0)
Hemoglobin: 8.7 g/dL — ABNORMAL LOW (ref 12.0–15.0)
MCH: 27.2 pg (ref 26.0–34.0)
MCHC: 32.7 g/dL (ref 30.0–36.0)
MCV: 83.1 fL (ref 80.0–100.0)
Platelets: UNDETERMINED 10*3/uL (ref 150–400)
RBC: 3.2 MIL/uL — ABNORMAL LOW (ref 3.87–5.11)
RDW: 17.2 % — ABNORMAL HIGH (ref 11.5–15.5)
WBC: 7.8 10*3/uL (ref 4.0–10.5)
nRBC: 0 % (ref 0.0–0.2)

## 2018-11-21 MED ORDER — DOXERCALCIFEROL 4 MCG/2ML IV SOLN
INTRAVENOUS | Status: AC
  Administered 2018-11-21: 4 ug via INTRAVENOUS
  Filled 2018-11-21: qty 2

## 2018-11-21 MED ORDER — DULOXETINE HCL 20 MG PO CPEP: 20.0000 mg | ORAL_CAPSULE | Freq: Every day | ORAL | 0 refills | Status: DC

## 2018-11-21 MED ORDER — HYDROMORPHONE HCL 2 MG PO TABS: 2.0000 mg | ORAL_TABLET | Freq: Four times a day (QID) | ORAL | 0 refills | Status: AC | PRN

## 2018-11-21 MED ORDER — HEPARIN SODIUM (PORCINE) 1000 UNIT/ML IJ SOLN
INTRAMUSCULAR | Status: AC
  Filled 2018-11-21: qty 4

## 2018-11-21 MED FILL — HYDROmorphone HCL 2 MG TABS: 2 | 5 days supply | Qty: 20 | Fill #0

## 2018-11-21 MED FILL — DULoxetine HCL 20 MG CPEP: 20 | 30 days supply | Qty: 30 | Fill #0

## 2018-11-21 NOTE — Progress Notes (Signed)
   11/21/18 1300  PT Visit Information  Last PT Received On 11/22/18  Reason Eval/Treat Not Completed Patient at procedure or test/unavailable   Pt was out of the room to HD when PT attempted to see her.  Try again as pt and time allow.  Mee Hives, PT MS Acute Rehab Dept. Number: [place] and [place]

## 2018-11-21 NOTE — Progress Notes (Signed)
DISCHARGE NOTE HOME Brittney Contreras to be discharged Home per MD order. Discussed prescriptions and follow up appointments with the patient. Prescriptions given to patient; medication list explained in detail. Patient verbalized understanding.  Skin clean, dry and intact without evidence of skin break down, no evidence of skin tears noted. IV catheter discontinued intact. Site without signs and symptoms of complications. Dressing and pressure applied. Pt denies pain at the site currently. No complaints noted.  Patient free of lines, drains, and wounds.   An After Visit Summary (AVS) was printed and given to the patient. Patient escorted via wheelchair, and discharged home via private auto.  Paulla Fore, RN

## 2018-11-21 NOTE — Plan of Care (Signed)
°  Problem: Safety: °Goal: Ability to remain free from injury will improve °Outcome: Progressing °  °Problem: Activity: °Goal: Risk for activity intolerance will decrease °Outcome: Progressing °  °

## 2018-11-21 NOTE — Progress Notes (Signed)
Pt states that she will update her family regarding her condition.   Paulla Fore, RN

## 2018-11-21 NOTE — Progress Notes (Signed)
   Subjective:  Brittney Contreras is Contreras 43 y.o. F with PMH of morbid obesity, ESRD, OSA, HFpEF admit for knee pain on hospital day 3  Brittney Contreras was examined at bedside and reports that she is doing 'Ok." She endorses 10/10 knee pain with ambulation. We informed her of the possibility of knee replacement in the future but will have to engage in weight loss therapy and follow up with outpatient orthopedic surgery. She is well aware of the worse outcomes if she misses dialysis. Her aunt had ESRD and she was one of her caretakers. Discussed plan for discharge home with additional pain control as we have run out of inpatient management options. She expressed understanding.  Objective:  Vital signs in last 24 hours: Vitals:   11/20/18 0541 11/20/18 1732 11/20/18 2123 11/21/18 0513  BP: 138/81 (!) 156/93 132/73 136/85  Pulse: 77 83 80 72  Resp: 19 19 18 19   Temp: 98.9 F (37.2 C)  98.6 F (37 C) 98.4 F (36.9 C)  TempSrc: Oral  Oral Oral  SpO2: 96% 100% 98% 96%  Weight:   131.8 kg   Height:       Physical Exam  Constitutional: She is oriented to person, place, and time. No distress.  Morbidly obese  Cardiovascular:  Right upper extremity fistula with good bruit  Musculoskeletal: Normal range of motion.        General: Tenderness (left knee tenderness w/ range of motion) present.  Neurological: She is alert and oriented to person, place, and time.   Assessment/Plan:  Principal Problem:   ESRD on hemodialysis Brittney Contreras) Active Problems:   Morbid obesity ([place])   Left knee pain  Brittney Contreras Flippinis a43 y.o.Fwith PMH ofmorbid obesity, ESRD, OSA, HFpEFadmit for left knee pain 2/2 meniscual tear. Received knee steroid injection yesterday afternoon. Ortho recommend f/u in outpatient setting. Although continuing to endorse 10/10 pain on evaluation, nursing chart notes pain score of 2/10, 3/10 overnight. Suspect possibly exaggeration of pain sensation. Plan for discharge home today with outpatient  ortho follow up.  Left leg pain 2/2 meniscual tear  MRI L knee: Complex tear of lateral meniscus body and posterior horn w/ extrusion of the body. Longitudinal tear of medial meniscus posterior horn. 7x50mm osteochondral lesion of lateral trochlea. Moderate joint effusion w/ small intra-articular bodies. CK 163 ruled out myositis. - Discharge home w/ ortho outpatient f/u - Start duloxetine 20mg  daily - C/w oral Dilaudid 1mg  q6hr PRN   End Stage Renal Disease Per nephrology, cleared for discharge after dialysis today  DVT prophx:subqheparin Diet:Renal Code:DNR  Dispo: Anticipated discharge in approximately today(s).   Mosetta Anis, MD 11/21/2018, 6:29 AM Pager: [phone]

## 2018-11-21 NOTE — TOC Progression Note (Signed)
Transition of Care [facility]) - Progression Note    Patient Details  Name: Brittney Contreras MRN: [id number] Date of Birth: [birth year]  [facility]) CM/SW Contact  Bartholomew Crews, RN Phone Number: [phone] 11/21/2018, 10:45 AM  Clinical Narrative:    Spoke with patient at the bedside. Bedside nurse with patient assisting with pain management. Patient not able to engage with CM about discharge planning. When asked if she has help at home, responded "I don't know yet." Patient agreed to CM f/u later today. PT recommends HH PT. Noted MD order for [facility] PT and RW.         Expected Discharge Plan and Services           Expected Discharge Date: 11/18/18                                     Social Determinants of Health (SDOH) Interventions    Readmission Risk Interventions No flowsheet data found.

## 2018-11-21 NOTE — TOC Progression Note (Signed)
Transition of Care [facility]) - Progression Note    Patient Details  Name: Brittney Contreras MRN: [id number] Date of Birth: 27-Mar-1976  Transition of [facility]) CM/SW Contact  Bartholomew Crews, RN Phone Number: [phone] 11/21/2018, 4:49 PM  Clinical Narrative:    Referral placed to [facility] for [place]. Clinical information - orders, H&P, and clinical notes - faxed to [phone]. HH PT scheduled for Tuesday 6/23.   Referral placed to [employer] to provide RW.   CM to follow for transition of care needs.       Expected Discharge Plan and Services           Expected Discharge Date: 11/18/18                                     Social Determinants of Health (SDOH) Interventions    Readmission Risk Interventions No flowsheet data found.

## 2018-11-21 NOTE — Progress Notes (Addendum)
  [facility] Progress Note   Assessment/ Plan:    Outpt HD Orders Unit: [facility] Days: M WF Time: 4 hours  Dialyzer: F-180 EDW: 130.5 kg K/Ca: 2/2 Access: TDC and right upper extremity aVF BFR/DFR: 300/800 UF Proflie: Profile 2 VDRA: Doxercalciferol 4 mcg q. treatment EPO: Mircera 100 mcg every 2 weeks, last administered 6/8 IV Fe: None currently, Heparin: 6000 units IV bolus  1.L knee pain- xrays without fracture, L hip xray negative, L knee with calcified loose body and tricompartmental arthrosis.  S/p arthrocentesis 6/16 - synovial fluid clear. Fluid studies pending. MRI showing complex tear of the lateral meniscus body and posterior horn with extrusion of the body - per ortho  2.ESRD - HD MWF.  HD today  3. Anemia: Hgb 9.5, not due for ESA yet- due 6//24 , follow 4. CKD-MBD:: VDRA/renvela. P better 5. Hypertension/volume : on coreg only, BP fairly well-controlled in hospital with med/volume control- net UF 2.6 Tues and 2 L Wed > 129 - needs standing wts today- evaluate for possible new edw at d/c 6.  Disp - ready for d/c from renal perspective after dialysis today  Subjective:    Doesn't know d/c plan - able to walk Ate all of breakfast.   Objective:   BP 136/75 (BP Location: Left Arm)   Pulse 79   Temp 98.7 F (37.1 C) (Oral)   Resp 12   Ht 5\' 5"  (1.651 m)   Wt 131.8 kg   LMP 03/15/2017   SpO2 95%   BMI 48.35 kg/m   Physical Exam: GEN: NAD breathing easily on room air CV: RRR, normal S1 and S2 PULM: clear anteriorly ABD: soft, nontender active BS SKIN: No rashes or lesions EXT: No significant edema in the lower extremities Vascular: Right upper arm tortuous negative fistula with bruit and thrill, R IJ [facility]   Labs: BMET Recent Labs  Lab 11/17/18 1228 11/18/18 0022 11/18/18 0756 11/19/18 1007 11/20/18 0956  NA 143  --  141 137 137  K 4.6  --  4.6 3.5 3.6  CL 116*  --  113* 101 99  CO2 16*  --  15* 23 25  GLUCOSE 89  --  95  143* 104*  BUN 72*  --  80* 45* 27*  CREATININE 8.43* 9.20* 8.98* 7.35* 5.79*  CALCIUM 9.1  --  8.8* 8.2* 8.8*  PHOS  --   --  8.2* 6.4* 4.4   CBC Recent Labs  Lab 11/17/18 1228 11/18/18 0755 11/19/18 1007  WBC 8.0 7.9 7.0  NEUTROABS 6.0  --   --   HGB 10.3* 9.5* 9.6*  HCT 31.8* 29.2* 28.9*  MCV 83.7 83.4 81.0  PLT 232 210 151    @IMGRELPRIORS @ Medications:    . carvedilol  6.25 mg Oral BID  . Chlorhexidine Gluconate Cloth  6 each Topical Q0600  . doxercalciferol  4 mcg Intravenous Q M,W,F-HD  . heparin  5,000 Units Subcutaneous Q8H  . multivitamin  1 tablet Oral QHS  . sevelamer carbonate  800 mg Oral TID WC    Alric Seton 11/21/2018,9:06 AM  I have personally seen and examined this patient and agree with the assessment/plan as outlined  Nachmen Mansel,MD 11/21/2018 9:01 PM

## 2018-11-24 LAB — CULTURE, BODY FLUID W GRAM STAIN -BOTTLE: Culture: NO GROWTH

## 2018-12-03 ENCOUNTER — Telehealth: Payer: Self-pay | Admitting: Internal Medicine

## 2018-12-03 NOTE — Telephone Encounter (Signed)
Call received from [employer]. Approval given for home PT once a week x 3 weeks for home exercise program

## 2018-12-03 NOTE — Telephone Encounter (Signed)
Pt needs verbal orders for physical therapy at home

## 2018-12-09 ENCOUNTER — Ambulatory Visit: Payer: Medicaid Other | Admitting: Orthopaedic Surgery

## 2018-12-11 ENCOUNTER — Telehealth: Payer: Self-pay | Admitting: Internal Medicine

## 2018-12-11 NOTE — Telephone Encounter (Signed)
Patient would like Dr Wynetta Emery to sent a referral to help her with assistant to go to the grocery , bedding and in the house  .  Please, call her at [phone] thank you

## 2018-12-12 NOTE — Telephone Encounter (Signed)
Pt request home health to assist with bathing and helping around the house due to decline in health status.

## 2018-12-16 ENCOUNTER — Telehealth: Payer: Self-pay | Admitting: Internal Medicine

## 2018-12-16 MED FILL — CARVEDILOL 6.25 MG TABLET: 6.25 | 30 days supply | Qty: 60 | Fill #0

## 2018-12-16 MED FILL — hydrALAZINE HCL 50 MG TABS: 50 | 30 days supply | Qty: 60 | Fill #0

## 2018-12-16 MED FILL — AMLODIPINE BESYLATE 10 MG T: 10 | 30 days supply | Qty: 30 | Fill #0

## 2018-12-16 NOTE — Telephone Encounter (Signed)
lyette called wants to know if a medication warning was received. States it was faxed over yesterday. Please follow up.

## 2018-12-17 NOTE — Telephone Encounter (Signed)
Returned Willette Cluster called and made her aware that we have received it but Dr. Wynetta Emery does her paperwork on Wednesdays

## 2018-12-22 ENCOUNTER — Telehealth: Payer: Self-pay

## 2018-12-22 NOTE — Telephone Encounter (Signed)
Referral for [facility] faxed to [place]

## 2019-01-05 ENCOUNTER — Ambulatory Visit: Payer: Medicaid Other | Admitting: Internal Medicine

## 2019-01-05 ENCOUNTER — Telehealth: Payer: Self-pay

## 2019-01-05 NOTE — Telephone Encounter (Signed)
Call placed to [employer], spoke to [facility] who stated they have scheduled the patient for an assessment tomorrow.

## 2019-03-04 ENCOUNTER — Other Ambulatory Visit: Payer: Self-pay

## 2019-03-04 ENCOUNTER — Encounter (HOSPITAL_COMMUNITY): Payer: Self-pay | Admitting: *Deleted

## 2019-03-04 ENCOUNTER — Emergency Department (HOSPITAL_COMMUNITY): Payer: Medicaid Other

## 2019-03-04 ENCOUNTER — Emergency Department (HOSPITAL_COMMUNITY)
Admission: EM | Admit: 2019-03-04 | Discharge: 2019-03-05 | Disposition: A | Discharge status: Home / Self Care | Payer: Medicaid Other | Attending: Emergency Medicine | Admitting: Emergency Medicine

## 2019-03-04 DIAGNOSIS — Z79899 Other long term (current) drug therapy: Secondary | ICD-10-CM | POA: Insufficient documentation

## 2019-03-04 DIAGNOSIS — I5032 Chronic diastolic (congestive) heart failure: Secondary | ICD-10-CM | POA: Diagnosis not present

## 2019-03-04 DIAGNOSIS — Z87891 Personal history of nicotine dependence: Secondary | ICD-10-CM | POA: Diagnosis not present

## 2019-03-04 DIAGNOSIS — R7989 Other specified abnormal findings of blood chemistry: Secondary | ICD-10-CM

## 2019-03-04 DIAGNOSIS — M25561 Pain in right knee: Secondary | ICD-10-CM

## 2019-03-04 DIAGNOSIS — N186 End stage renal disease: Secondary | ICD-10-CM | POA: Diagnosis not present

## 2019-03-04 DIAGNOSIS — Z992 Dependence on renal dialysis: Secondary | ICD-10-CM | POA: Insufficient documentation

## 2019-03-04 DIAGNOSIS — I132 Hypertensive heart and chronic kidney disease with heart failure and with stage 5 chronic kidney disease, or end stage renal disease: Secondary | ICD-10-CM | POA: Diagnosis not present

## 2019-03-04 LAB — CBC
HCT: 34.5 % — ABNORMAL LOW (ref 36.0–46.0)
Hemoglobin: 12 g/dL (ref 12.0–15.0)
MCH: 30.2 pg (ref 26.0–34.0)
MCHC: 34.8 g/dL (ref 30.0–36.0)
MCV: 86.9 fL (ref 80.0–100.0)
Platelets: 178 10*3/uL (ref 150–400)
RBC: 3.97 MIL/uL (ref 3.87–5.11)
RDW: 17.6 % — ABNORMAL HIGH (ref 11.5–15.5)
WBC: 8.7 10*3/uL (ref 4.0–10.5)
nRBC: 0.2 % (ref 0.0–0.2)

## 2019-03-04 LAB — COMPREHENSIVE METABOLIC PANEL
ALT: 16 U/L (ref 0–44)
AST: 33 U/L (ref 15–41)
Albumin: 3.9 g/dL (ref 3.5–5.0)
Alkaline Phosphatase: 39 U/L (ref 38–126)
Anion gap: 19 — ABNORMAL HIGH (ref 5–15)
BUN: 55 mg/dL — ABNORMAL HIGH (ref 6–20)
CO2: 20 mmol/L — ABNORMAL LOW (ref 22–32)
Calcium: 8.9 mg/dL (ref 8.9–10.3)
Chloride: 100 mmol/L (ref 98–111)
Creatinine, Ser: 12.17 mg/dL — ABNORMAL HIGH (ref 0.44–1.00)
GFR calc Af Amer: 4 mL/min — ABNORMAL LOW (ref >60)
GFR calc non Af Amer: 3 mL/min — ABNORMAL LOW (ref >60)
Glucose, Bld: 88 mg/dL (ref 70–99)
Potassium: 3.8 mmol/L (ref 3.5–5.1)
Sodium: 139 mmol/L (ref 135–145)
Total Bilirubin: 0.6 mg/dL (ref 0.3–1.2)
Total Protein: 7.7 g/dL (ref 6.5–8.1)

## 2019-03-04 MED ORDER — HYDROMORPHONE HCL 1 MG/ML IJ SOLN
0.5000 mg | Freq: Once | INTRAMUSCULAR | Status: AC
  Administered 2019-03-04: 0.5 mg via INTRAVENOUS
  Filled 2019-03-04: qty 1

## 2019-03-04 NOTE — ED Notes (Signed)
Patient transported to X-ray 

## 2019-03-04 NOTE — ED Provider Notes (Signed)
[facility] EMERGENCY DEPARTMENT Provider Note   CSN: [id number] Arrival date & time: 03/04/19  1717     History   Chief Complaint Chief Complaint  Patient presents with  . Knee Pain    HPI Brittney Contreras is a 43 y.o. female with a history of ESRD on HD (M/W/F), tobacco abuse, OSA, morbid obesity, HTN, hypertensive hypertrophic cardiomyopathy, chronic diastolic heart failure, and primary osteoarthritis of the bilateral knees who presents to the emergency department with a chief complaint of RIGHT knee pain.  The patient endorses right knee pain for the last week.  She states that the pain is worse over the anterior aspect of the knee and radiates as sharp, shooting pain intermittently up to her right buttock.  She reports that when the pain worsens but she also has pain in her low back.  She seems the pain seems to be worse at night, but cannot identify any other aggravating or alleviating factors.  She has been seen at [facility] ER for the same 2 additional times with the last 48 hours.   She has been able to walk, but it is painful.  She reports that she has not ambulated much today due to the pain.  She was given an Ace wrap in the ER, which she has been wearing with minimal improvement in her symptoms.  She has been taking Tylenol with no improvement.  She denies of fever, chills, redness, warmth to the extremity, numbness, weakness.  No history of joint injections.  She reports that she was seen for the same in June 2020 and was diagnosed with osteoarthritis of the LEFT knee after requiring hospitalization for emergent dialysis.  She reports that she missed her dialysis session today due to the pain in her knee.  The patient reports that she was last dialyzed on 9/28, but did not complete a full session at her facility as facility on [address]. Last full dialysis session was 9/26.   She denies chest pain, shortness of breath, nausea, vomiting, diarrhea, abdominal  pain, confusion, or leg swelling.  No history of IV drug use or cancer.     The history is provided by the patient. No language interpreter was used.    Past Medical History:  Diagnosis Date  . Anemia   . Chronic kidney disease   . Diastolic heart failure: Grade 3  07/25/2012  . Hypertension   . Hypertensive hypertrophic cardiomyopathy ([place]) 07/28/2012  . Malignant hypertension with congestive heart failure ([place]) 07/25/2012  . Obesity, morbid ([place]) 07/25/2012  . Obstructive sleep apnea   . Pneumonia   . Tobacco abuse 07/27/2012    Patient Active Problem List   Diagnosis Date Noted  . Left knee pain 11/18/2018  . ESRD on hemodialysis ([place]) 11/17/2018  . Primary osteoarthritis of both knees 11/11/2018  . ESRD (end stage renal disease) ([place]) 09/16/2018  . Periumbilical abdominal pain 06/20/2017  . Missed period 06/20/2017  . Bilateral low back pain 06/20/2017  . 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 Former smoker 03/28/2017  . Hyperlipidemia 03/28/2017  . Chronic diastolic heart failure ([place]) 03/23/2017  . Systolic murmur [id number]  . Unspecified hypothyroidism 04/08/2013  . Noncompliance 04/08/2013  . Secondary hyperparathyroidism ([place]) 03/23/2013  . Hypertensive hypertrophic cardiomyopathy ([place]) 07/28/2012  . Hypertension 07/25/2012  . Morbid obesity ([place]) 07/25/2012    Past Surgical History:  Procedure Laterality Date  . AV FISTULA PLACEMENT Right 08/01/2012   Procedure: ARTERIOVENOUS (AV) FISTULA CREATION;  Surgeon: Nelda Severe  Kellie Simmering, MD;  Location: [facility];  Service: Vascular;  Laterality: Right;  Ultrasound guided; Attempted wrist AV Fistula Creation.  . AV FISTULA PLACEMENT Right 07/2012  . INSERTION OF DIALYSIS CATHETER Right 09/19/2018   Procedure: INSERTION OF DIALYSIS CATHETER;  Surgeon: [name], MD;  Location: [facility];  Service: Vascular;  Laterality: Right;  . LIGATION OF COMPETING BRANCHES OF ARTERIOVENOUS FISTULA Right 11/05/2012   Procedure:  LIGATION OF COMPETING BRANCHES OF ARTERIOVENOUS FISTULA;  Surgeon: Mal Misty, MD;  Location: [facility];  Service: Vascular;  Laterality: Right;  . SHUNTOGRAM Right 12/30/2012   Procedure: FISTULOGRAM;  Surgeon: Serafina Mitchell, MD;  Location: [facility] CATH LAB;  Service: Cardiovascular;  Laterality: Right;     OB History   No obstetric history on file.      Home Medications    Prior to Admission medications   Medication Sig Start Date End Date Taking? Authorizing Provider  b complex-vitamin c-folic acid (NEPHRO-VITE) 0.8 MG TABS tablet Take 1 tablet by mouth daily.  09/26/18   [provider]  carvedilol (COREG) 6.25 MG tablet Take 1 tablet (6.25 mg total) by mouth 2 (two) times daily. 10/21/17   Ladell Pier, MD  DULoxetine (CYMBALTA) 20 MG capsule Take 1 capsule (20 mg total) by mouth daily. 11/21/18   Mosetta Anis, MD  oxyCODONE (ROXICODONE) 5 MG immediate release tablet Take 1 tablet (5 mg total) by mouth every 6 (six) hours as needed for severe pain. 03/05/19   Vihan Santagata A, PA-C  zolpidem (AMBIEN) 5 MG tablet Take 5 mg by mouth at bedtime as needed for sleep.    [provider]    Family History Family History  Problem Relation Age of Onset  . Hypertension Mother   . Heart failure Mother   . Prostate cancer Father   . Kidney disease Other        Aunt, deceased, had been on dialysis    Social History Social History   Tobacco Use  . Smoking status: Former Smoker    Packs/day: 0.30    Years: 20.00    Pack years: 6.00    Types: Cigarettes    Quit date: 07/2016    Years since quitting: 2.6  . Smokeless tobacco: Never Used  . Tobacco comment: pack lasts 4-5 days  Substance Use Topics  . Alcohol use: No    Comment: occ  . Drug use: No    Comment: No hx of IV Use     Allergies   Patient has no known allergies.   Review of Systems Review of Systems  Constitutional: Negative for activity change, chills, diaphoresis, fatigue and fever.   Respiratory: Negative for shortness of breath.   Cardiovascular: Negative for chest pain.  Gastrointestinal: Negative for abdominal pain, constipation, diarrhea, nausea and vomiting.  Genitourinary: Negative for dysuria.  Musculoskeletal: Positive for arthralgias, back pain, gait problem and myalgias. Negative for joint swelling, neck pain and neck stiffness.  Skin: Negative for color change, rash and wound.  Allergic/Immunologic: Negative for immunocompromised state.  Neurological: Negative for dizziness, seizures, syncope, weakness, numbness and headaches.  Psychiatric/Behavioral: Negative for confusion.   Physical Exam Updated Vital Signs BP (!) 161/102 (BP Location: Left Arm)   Pulse 83   Temp 97.9 F (36.6 C) (Oral)   Resp 20   Ht 5\' 3"  (1.6 m)   Wt 129.7 kg   LMP 03/15/2017   SpO2 100%   BMI 50.65 kg/m   Physical Exam Vitals signs  and nursing note reviewed.  Constitutional:      General: She is not in acute distress.    Appearance: She is not ill-appearing, toxic-appearing or diaphoretic.     Comments: Morbidly obese.  HENT:     Head: Normocephalic.  Eyes:     Conjunctiva/sclera: Conjunctivae normal.  Neck:     Musculoskeletal: Neck supple.  Cardiovascular:     Rate and Rhythm: Normal rate and regular rhythm.     Heart sounds: No murmur. No friction rub. No gallop.   Pulmonary:     Effort: Pulmonary effort is normal. No respiratory distress.  Abdominal:     General: There is no distension.     Palpations: Abdomen is soft. There is no mass.     Tenderness: There is no abdominal tenderness. There is no right CVA tenderness, left CVA tenderness, guarding or rebound.     Hernia: No hernia is present.  Musculoskeletal:     Comments: Tender to palpation over the inferior patella and patellar tendon.  Increased pain with flexion and extension.  No medial or lateral joint line tenderness.  Negative anterior posterior drawer test.  Negative valgus and varus stress test.   No overlying swelling, redness, or warmth.  No calf pain or tenderness to the bilateral lower extremities.  Right hip is nontender to palpation with normal range of motion without pain.  Normal exam of the right ankle.  She has diffusely tender to palpation to the bilateral low back without focal tenderness to the spinous processes of the lumbar spine.   Able to bear weight on the bilateral lower extremities.  Antalgic gait.  Sensation is intact and equal throughout the bilateral lower extremities.  DP and PT pulses are 2+ and symmetric.   Skin:    General: Skin is warm.     Findings: No rash.  Neurological:     Mental Status: She is alert.  Psychiatric:        Behavior: Behavior normal.      ED Treatments / Results  Labs (all labs ordered are listed, but only abnormal results are displayed) Labs Reviewed  CBC - Abnormal; Notable for the following components:      Result Value   HCT 34.5 (*)    RDW 17.6 (*)    All other components within normal limits  COMPREHENSIVE METABOLIC PANEL - Abnormal; Notable for the following components:   CO2 20 (*)    BUN 55 (*)    Creatinine, Ser 12.17 (*)    GFR calc non Af Amer 3 (*)    GFR calc Af Amer 4 (*)    Anion gap 19 (*)    All other components within normal limits    EKG EKG Interpretation  Date/Time:  Wednesday March 04 2019 22:34:11 EDT Ventricular Rate:  89 PR Interval:    QRS Duration: 94 QT Interval:  459 QTC Calculation: 559 R Axis:   93 Text Interpretation:  Sinus rhythm Borderline right axis deviation Borderline T abnormalities, anterior leads Prolonged QT interval No significant change since last tracing Confirmed by Pryor Curia [phone]) on 03/05/2019 12:13:28 AM   Radiology Dg Knee Complete 4 Views Right  Result Date: 03/05/2019 CLINICAL DATA:  43 year old female with right knee pain. No trauma. EXAM: RIGHT KNEE - COMPLETE 4+ VIEW COMPARISON:  None. FINDINGS: There is no acute fracture or dislocation. Moderate  arthritic changes with joint space narrowing and spurring. No significant joint effusion. The soft tissues are grossly unremarkable. Vascular calcification. IMPRESSION:  1. No acute fracture or dislocation. 2. Moderate arthritic changes. Electronically Signed   By: Anner Crete M.D.   On: 03/05/2019 00:05    Procedures Procedures (including critical care time)  Medications Ordered in ED Medications  HYDROmorphone (DILAUDID) injection 0.5 mg (0.5 mg Intravenous Given 03/04/19 2334)     Initial Impression / Assessment and Plan / ED Course  I have reviewed the triage vital signs and the nursing notes.  Pertinent labs & imaging results that were available during my care of the patient were reviewed by me and considered in my medical decision making (see chart for details).        43 year old female with a history of ESRD on HD (M/W/F), tobacco abuse, OSA, morbid obesity, HTN, hypertensive hypertrophic cardiomyopathy, chronic diastolic heart failure, and primary osteoarthritis of the bilateral knees who presents to the emergency department with atraumatic right knee pain.  On exam, she is tender over the anterior aspect of the knee with range of motion.  X-ray of the knee with moderate arthritic changes.  Low suspicion for septic joint or gout at this time.  She has been ambulatory.  She has no neurovascular deficits and is ambulatory today in the ER.  Pain is well controlled with small dose of Dilaudid.  Suspect arthritis exacerbation.   The patient's ER visit was complicated by the fact that she has not received a full dialysis treatment since 9/26.  She missed her treatment yesterday and only received a partial treatment on 9/28.  No peak T waves on EKG.  No significant electrolyte abnormalities and potassium was within normal limits.  She is having no chest pain or GI symptoms.  The patient was discussed with Dr. Leonides Schanz, attending physician.  Given her labs today, she can follow-up with her  outpatient dialysis appointment as she does not meet criteria for emergent inpatient dialysis.  We will discharge with a short course of oxycodone since she is on dialysis.  We will provide the patient with a referral to orthopedics.  She is agreement with the work-up and plan.  She is hemodynamically stable and in no acute distress.  Stable discharged home with outpatient follow-up.  Final Clinical Impressions(s) / ED Diagnoses   Final diagnoses:  Acute pain of right knee  Elevated serum creatinine    ED Discharge Orders         Ordered    oxyCODONE (ROXICODONE) 5 MG immediate release tablet  Every 6 hours PRN     03/05/19 0106           Codee Bloodworth A, PA-C 03/05/19 0704    Ward, Delice Bison, DO 03/05/19 [id number]

## 2019-03-04 NOTE — ED Triage Notes (Signed)
The pt is c/o rt knee pain for one week.  No know injury  lmp none dialysis pt  Rt arm fistula    Dialyzed last Monday  Missed dialysis today

## 2019-03-04 NOTE — ED Triage Notes (Signed)
The pt was seen at [facility] last pm for the same

## 2019-03-05 MED ORDER — OXYCODONE HCL 5 MG PO TABS: 5.0000 mg | ORAL_TABLET | Freq: Four times a day (QID) | ORAL | 0 refills | Status: DC | PRN

## 2019-03-05 NOTE — ED Notes (Signed)
All appropriate discharge materials reviewed at length with patient. Time for questions provided. Pt has no other questions at this time and verbalizes understanding of all provided materials.  

## 2019-03-05 NOTE — Discharge Instructions (Signed)
Thank you for allowing me to care for you today in the Emergency Department.   It is very important you go to your dialysis appointment on Friday.  If you develop chest pain, shortness of breath, vomiting, severe abdominal pain, confusion, or other new concerning symptoms prior to dialysis, you should return to the ER for reevaluation since you have missed dialysis this week.  To treat your knee pain at home, wear the Ace wrap to provide compression.  Try to stretch the muscles of your knee as your pain allows.  Exercises have been attached along with your discharge instructions.  You can take thousand grams of Tylenol once every 8 hours.  For severe, uncontrollable pain, you can take 1 tablet of oxycodone.  This is a narcotic.  It can be addicting.  Do not work or drive while taking this medication because it can cause you to be impaired.   Please call to schedule follow-up appointment with Dr. Doreatha Martin if your pain does not improve with this regimen.  Return to the emergency department if you develop significant pain or swelling in her calf, thigh, new numbness or weakness, if the knee gets red, hot and swollen to the touch, if you start to have thick, mucus-like drainage from the knee, or if you develop other new, concerning symptoms.

## 2019-04-03 MED FILL — AMLODIPINE BESYLATE 10 MG T: 10 | 30 days supply | Qty: 30 | Fill #0

## 2019-04-03 MED FILL — hydrALAZINE HCL 50 MG TABS: 50 | 30 days supply | Qty: 60 | Fill #0

## 2019-04-03 MED FILL — CARVEDILOL 6.25 MG TABLET: 6.25 | 30 days supply | Qty: 60 | Fill #0

## 2019-04-15 MED FILL — AMLODIPINE BESYLATE 10 MG T: 10 | 30 days supply | Qty: 30 | Fill #0

## 2019-04-15 MED FILL — CARVEDILOL 6.25 MG TABLET: 6.25 | 30 days supply | Qty: 60 | Fill #0

## 2019-04-15 MED FILL — hydrALAZINE HCL 50 MG TABS: 50 | 30 days supply | Qty: 60 | Fill #0

## 2019-05-27 MED FILL — AMLODIPINE BESYLATE 10 MG T: 10 | 30 days supply | Qty: 30 | Fill #0

## 2019-06-10 MED FILL — GABAPENTIN 100 MG CAP: 100 | 30 days supply | Qty: 30 | Fill #0

## 2019-08-04 MED FILL — AMLODIPINE BESYLATE 10 MG T: 10 | 30 days supply | Qty: 30 | Fill #0

## 2019-10-02 ENCOUNTER — Ambulatory Visit: Payer: Medicaid Other

## 2019-10-21 ENCOUNTER — Emergency Department (HOSPITAL_COMMUNITY)
Admission: EM | Admit: 2019-10-21 | Discharge: 2019-10-21 | Disposition: A | Discharge status: Home / Self Care | Payer: Medicaid Other | Attending: Emergency Medicine | Admitting: Emergency Medicine

## 2019-10-21 DIAGNOSIS — Z992 Dependence on renal dialysis: Secondary | ICD-10-CM | POA: Insufficient documentation

## 2019-10-21 DIAGNOSIS — I5032 Chronic diastolic (congestive) heart failure: Secondary | ICD-10-CM | POA: Diagnosis not present

## 2019-10-21 DIAGNOSIS — Y712 Prosthetic and other implants, materials and accessory cardiovascular devices associated with adverse incidents: Secondary | ICD-10-CM | POA: Diagnosis not present

## 2019-10-21 DIAGNOSIS — T82838A Hemorrhage of vascular prosthetic devices, implants and grafts, initial encounter: Secondary | ICD-10-CM | POA: Diagnosis not present

## 2019-10-21 DIAGNOSIS — N186 End stage renal disease: Secondary | ICD-10-CM | POA: Diagnosis not present

## 2019-10-21 DIAGNOSIS — I132 Hypertensive heart and chronic kidney disease with heart failure and with stage 5 chronic kidney disease, or end stage renal disease: Secondary | ICD-10-CM | POA: Insufficient documentation

## 2019-10-21 DIAGNOSIS — Z87891 Personal history of nicotine dependence: Secondary | ICD-10-CM | POA: Insufficient documentation

## 2019-10-21 LAB — CBC
HCT: 31 % — ABNORMAL LOW (ref 36.0–46.0)
Hemoglobin: 10.5 g/dL — ABNORMAL LOW (ref 12.0–15.0)
MCH: 32.4 pg (ref 26.0–34.0)
MCHC: 33.9 g/dL (ref 30.0–36.0)
MCV: 95.7 fL (ref 80.0–100.0)
Platelets: 149 10*3/uL — ABNORMAL LOW (ref 150–400)
RBC: 3.24 MIL/uL — ABNORMAL LOW (ref 3.87–5.11)
RDW: 14.6 % (ref 11.5–15.5)
WBC: 6.2 10*3/uL (ref 4.0–10.5)
nRBC: 0 % (ref 0.0–0.2)

## 2019-10-21 LAB — BASIC METABOLIC PANEL
Anion gap: 18 — ABNORMAL HIGH (ref 5–15)
BUN: 55 mg/dL — ABNORMAL HIGH (ref 6–20)
CO2: 21 mmol/L — ABNORMAL LOW (ref 22–32)
Calcium: 9.1 mg/dL (ref 8.9–10.3)
Chloride: 103 mmol/L (ref 98–111)
Creatinine, Ser: 15.66 mg/dL — ABNORMAL HIGH (ref 0.44–1.00)
GFR calc Af Amer: 3 mL/min — ABNORMAL LOW (ref >60)
GFR calc non Af Amer: 2 mL/min — ABNORMAL LOW (ref >60)
Glucose, Bld: 90 mg/dL (ref 70–99)
Potassium: 4.4 mmol/L (ref 3.5–5.1)
Sodium: 142 mmol/L (ref 135–145)

## 2019-10-21 LAB — I-STAT BETA HCG BLOOD, ED (MC, WL, AP ONLY): I-stat hCG, quantitative: <5 m[IU]/mL (ref <5)

## 2019-10-21 MED ORDER — SODIUM CHLORIDE 0.9% FLUSH: 3.0000 mL | Freq: Once | INTRAVENOUS | Status: DC

## 2019-10-21 NOTE — Discharge Instructions (Signed)
You were seen in the emergency room today with bleeding from your dialysis access.  I placed a dressing in the area and do not see any active bleeding.  Your lab work is reassuring.  Please keep your appointment tomorrow and call your dialysis center to schedule your next dialysis appointment.  Return to the emergency department any new or suddenly worsening symptoms.

## 2019-10-21 NOTE — ED Provider Notes (Signed)
Emergency Department Provider Note   I have reviewed the triage vital signs and the nursing notes.   HISTORY  Chief Complaint Vascular Access Problem   HPI Brittney Contreras is a 44 y.o. female with past medical history reviewed below including end-stage renal disease on dialysis presents with bleeding from her right upper extremity fistula.  She noticed some oozing blood last night.  There was no trauma to the area.  Patient states that she has had some mild bleeding before after accessing the site although did not have immediate bleeding after her last dialysis 2 days ago.  Patient states that she has a follow-up appointment with the vascular team tomorrow to evaluate the site.  She denies any weakness, lightheadedness, shortness of breath, or other symptoms.   Past Medical History:  Diagnosis Date  . Anemia   . Chronic kidney disease   . Diastolic heart failure: Grade 3  07/25/2012  . Hypertension   . Hypertensive hypertrophic cardiomyopathy ([place]) 07/28/2012  . Malignant hypertension with congestive heart failure ([place]) 07/25/2012  . Obesity, morbid ([place]) 07/25/2012  . Obstructive sleep apnea   . Pneumonia   . Tobacco abuse 07/27/2012    Patient Active Problem List   Diagnosis Date Noted  . Left knee pain 11/18/2018  . ESRD on hemodialysis ([place]) 11/17/2018  . Primary osteoarthritis of both knees 11/11/2018  . ESRD (end stage renal disease) ([place]) 09/16/2018  . Periumbilical abdominal pain 06/20/2017  . Missed period 06/20/2017  . Bilateral low back pain 06/20/2017  . 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 Former smoker 03/28/2017  . Hyperlipidemia 03/28/2017  . Chronic diastolic heart failure ([place]) 03/23/2017  . Systolic murmur 77/82/4235  . Unspecified hypothyroidism 04/08/2013  . Noncompliance 04/08/2013  . Secondary hyperparathyroidism ([place]) 03/23/2013  . Hypertensive hypertrophic cardiomyopathy ([place]) 07/28/2012  . Hypertension 07/25/2012  . Morbid obesity ([place])  07/25/2012    Past Surgical History:  Procedure Laterality Date  . AV FISTULA PLACEMENT Right 08/01/2012   Procedure: ARTERIOVENOUS (AV) FISTULA CREATION;  Surgeon: Mal Misty, MD;  Location: [facility];  Service: Vascular;  Laterality: Right;  Ultrasound guided; Attempted wrist AV Fistula Creation.  . AV FISTULA PLACEMENT Right 07/2012  . INSERTION OF DIALYSIS CATHETER Right 09/19/2018   Procedure: INSERTION OF DIALYSIS CATHETER;  Surgeon: [name], MD;  Location: [facility];  Service: Vascular;  Laterality: Right;  . LIGATION OF COMPETING BRANCHES OF ARTERIOVENOUS FISTULA Right 11/05/2012   Procedure: LIGATION OF COMPETING BRANCHES OF ARTERIOVENOUS FISTULA;  Surgeon: Mal Misty, MD;  Location: [place];  Service: Vascular;  Laterality: Right;  . SHUNTOGRAM Right 12/30/2012   Procedure: FISTULOGRAM;  Surgeon: Serafina Mitchell, MD;  Location[facility] C[facility] LAB;  Service: Cardiovascular;  Laterality: Right;    Allergies Patient has no known allergies.  Family History  Problem Relation Age of Onset  . Hypertension Mother   . Heart failure Mother   . Prostate cancer Father   . Kidney disease Other        Aunt, deceased, had been on dialysis    Social History Social History   Tobacco Use  . Smoking status: Former Smoker    Packs/day: 0.30    Years: 20.00    Pack years: 6.00    Types: Cigarettes    Quit date: 07/2016    Years since quitting: 3.2  . Smokeless tobacco: Never Used  . Tobacco comment: pack lasts 4-5 days  Substance Use Topics  . Alcohol use: No  Comment: occ  . Drug use: No    Comment: No hx of IV Use    Review of Systems  Constitutional: No fever/chills Cardiovascular: Denies chest pain. Respiratory: Denies shortness of breath. Gastrointestinal: No abdominal pain. No nausea, no vomiting.  No diarrhea.  No constipation. Skin: Oozing from HD access site.  Neurological: Negative for headaches.  10-point ROS otherwise  negative.  ____________________________________________   PHYSICAL EXAM:  VITAL SIGNS: ED Triage Vitals  Enc Vitals Group     BP 10/21/19 1320 (!) 166/116     Pulse Rate 10/21/19 1320 87     Resp 10/21/19 1320 15     Temp 10/21/19 1320 98.2 F (36.8 C)     Temp Source 10/21/19 1320 Oral     SpO2 10/21/19 1320 97 %   Constitutional: Alert and oriented. Well appearing and in no acute distress. Eyes: Conjunctivae are normal. Head: Atraumatic. Neck: No stridor.  Cardiovascular: Normal rate, regular rhythm.  Respiratory: Normal respiratory effort.   Gastrointestinal:  No distention.  Musculoskeletal:  No gross deformities of extremities. Neurologic:  Normal speech and language. Skin:  Skin is warm and dry. Punctate area pictured below over the RUE fistula. No active bleeding.      ____________________________________________   LABS (all labs ordered are listed, but only abnormal results are displayed)  Labs Reviewed  BASIC METABOLIC PANEL - Abnormal; Notable for the following components:      Result Value   CO2 21 (*)    BUN 55 (*)    Creatinine, Ser 15.66 (*)    GFR calc non Af Amer 2 (*)    GFR calc Af Amer 3 (*)    Anion gap 18 (*)    All other components within normal limits  CBC - Abnormal; Notable for the following components:   RBC 3.24 (*)    Hemoglobin 10.5 (*)    HCT 31.0 (*)    Platelets 149 (*)    All other components within normal limits  I-STAT BETA HCG BLOOD, ED ([facility], [facility], [facility] ONLY)    ____________________________________________   PROCEDURES  Procedure(s) performed:   Procedures  None  ____________________________________________   INITIAL IMPRESSION / ASSESSMENT AND PLAN / ED COURSE  Pertinent labs & imaging results that were available during my care of the patient were reviewed by me and considered in my medical decision making (see chart for details).   No active bleeding from the patient's fistula.  She has vascular follow-up  tomorrow and will call for dialysis session as well.  Patient has no emergent indication for dialysis.  I placed a bulky dressing over the area and will discharge home.    ____________________________________________  FINAL CLINICAL IMPRESSION(S) / ED DIAGNOSES  Final diagnoses:  Bleeding from dialysis shunt, initial encounter ([place])    MEDICATIONS GIVEN DURING THIS VISIT:  Medications  sodium chloride flush (NS) 0.9 % injection 3 mL (has no administration in time range)    Note:  This document was prepared using Dragon voice recognition software and may include unintentional dictation errors.  Nanda Quinton, MD, [facility] Emergency Medicine    Glynn Yepes, [facility], MD 10/21/19 2022

## 2019-10-21 NOTE — Progress Notes (Signed)
HISTORY AND PHYSICAL     CC:  dialysis access Requesting Provider:  Ladell Pier, MD  HPI: This is a 44 y.o. female here for evaluation of her hemodialysis access.    She has dialysis access hx as follows: -right RC AVF with thrombectomy and ligation on 08/01/2012 (JDL) -creation of right BC AVF 08/01/2012 (JDL) -ligation of branches right BC AVF 11/05/2012 (JDL) -angioplasty right cephalic vein 9/47/6546 (WB) -TDC right IJ 09/19/2018 (BCC)  She comes in today for evaluation of her right arm fistula.  She states that she has some concerns about it.  She states that she was in the ER last evening with concerns with bleeding from her fistula.  Since she had an appointment today and there was no urgent need for HD, she was discharged with plans to f/u today.  When asked, she states it has not bled.  She states she has been on HD for a year. She does not have any pain in her right hand.   She states that otherwise, there has not been any issues with her access.   Her BP is elevated today and she states that she does not take her BP medications.    The pt is right hand dominant.    Pt is on dialysis M/W/F at the [place] location   The pt is not on a statin for cholesterol management.  The pt is not on a daily aspirin.  Other AC:  none The pt is on BB for hypertension.  The pt is not diabetic.   Tobacco hx:  Reports quitting but does have cigarettes in pocket  Past Medical History:  Diagnosis Date  . Anemia   . Chronic kidney disease   . Diastolic heart failure: Grade 3  07/25/2012  . Hypertension   . Hypertensive hypertrophic cardiomyopathy ([place]) 07/28/2012  . Malignant hypertension with congestive heart failure ([place]) 07/25/2012  . Obesity, morbid ([place]) 07/25/2012  . Obstructive sleep apnea   . Pneumonia   . Tobacco abuse 07/27/2012    Past Surgical History:  Procedure Laterality Date  . AV FISTULA PLACEMENT Right 08/01/2012   Procedure: ARTERIOVENOUS (AV) FISTULA  CREATION;  Surgeon: Mal Misty, MD;  Location: [place];  Service: Vascular;  Laterality: Right;  Ultrasound guided; Attempted wrist AV Fistula Creation.  . AV FISTULA PLACEMENT Right 07/2012  . INSERTION OF DIALYSIS CATHETER Right 09/19/2018   Procedure: INSERTION OF DIALYSIS CATHETER;  Surgeon: [name], MD;  Location: [place] [facility];  Service: Vascular;  Laterality: Right;  . LIGATION OF COMPETING BRANCHES OF ARTERIOVENOUS FISTULA Right 11/05/2012   Procedure: LIGATION OF COMPETING BRANCHES OF ARTERIOVENOUS FISTULA;  Surgeon: Mal Misty, MD;  Location: [place];  Service: Vascular;  Laterality: Right;  . SHUNTOGRAM Right 12/30/2012   Procedure: FISTULOGRAM;  Surgeon: Serafina Mitchell, MD;  Location: [facility] CATH LAB;  Service: Cardiovascular;  Laterality: Right;    No Known Allergies  Current Outpatient Medications  Medication Sig Dispense Refill  . b complex-vitamin c-folic acid (NEPHRO-VITE) 0.8 MG TABS tablet Take 1 tablet by mouth daily.     . carvedilol (COREG) 6.25 MG tablet Take 1 tablet (6.25 mg total) by mouth 2 (two) times daily. 180 tablet 3  . DULoxetine (CYMBALTA) 20 MG capsule Take 1 capsule (20 mg total) by mouth daily. 30 capsule 0  . oxyCODONE (ROXICODONE) 5 MG immediate release tablet Take 1 tablet (5 mg total) by mouth every 6 (six) hours as needed for severe  pain. 8 tablet 0  . zolpidem (AMBIEN) 5 MG tablet Take 5 mg by mouth at bedtime as needed for sleep.     Current Facility-Administered Medications  Medication Dose Route Frequency Provider Last Rate Last Admin  . cloNIDine (CATAPRES) tablet 0.2 mg  0.2 mg Oral Once End, Christopher, MD        Family History  Problem Relation Age of Onset  . Hypertension Mother   . Heart failure Mother   . Prostate cancer Father   . Kidney disease Other        Aunt, deceased, had been on dialysis    Social History   Socioeconomic History  . Marital status: Single    Spouse name: Not on file  . Number of children: Not on  file  . Years of education: Not on file  . Highest education level: Not on file  Occupational History  . Not on file  Tobacco Use  . Smoking status: Former Smoker    Packs/day: 0.30    Years: 20.00    Pack years: 6.00    Types: Cigarettes    Quit date: 07/2016    Years since quitting: 3.2  . Smokeless tobacco: Never Used  . Tobacco comment: pack lasts 4-5 days  Substance and Sexual Activity  . Alcohol use: No    Comment: occ  . Drug use: No    Comment: No hx of IV Use  . Sexual activity: Not on file  Other Topics Concern  . Not on file  Social History Narrative   Lives with her uncle   Was previously a Quarry manager but currently [employer]         Social Determinants of Health   Financial Resource Strain:   . Difficulty of Paying Living Expenses:   Food Insecurity:   . Worried About Charity fundraiser in the Last Year:   . Arboriculturist in the Last Year:   Transportation Needs:   . Film/video editor (Medical):   Marland Kitchen Lack of Transportation (Non-Medical):   Physical Activity:   . Days of Exercise per Week:   . Minutes of Exercise per Session:   Stress:   . Feeling of Stress :   Social Connections:   . Frequency of Communication with Friends and Family:   . Frequency of Social Gatherings with Friends and Family:   . Attends Religious Services:   . Active Member of Clubs or Organizations:   . Attends Archivist Meetings:   Marland Kitchen Marital Status:   Intimate Partner Violence:   . Fear of Current or Ex-Partner:   . Emotionally Abused:   Marland Kitchen Physically Abused:   . Sexually Abused:      ROS: [x]  Positive   [ ]  Negative   [ ]  All sytems reviewed and are negative  Cardiac: []  chest pain/pressure []  SOB []  DOE  Vascular: []  pain in legs while walking []  pain in feet when lying flat []  hx of DVT []  swelling in legs  Pulmonary: []  asthma []  wheezing  Neurologic: []  weakness in []  arms []  legs []  numbness in []  arms []  legs [] difficulty speaking or  slurred speech []  temporary loss of vision in one eye []  dizziness  Hematologic: []  bleeding problems  GI []  GERD  GU: [x]  CKD/renal failure  [x]  HD---[x]  M/W/F []  T/T/S []  burning with urination []  blood in urine  Psychiatric: []  hx of major depression  Integumentary: []  rashes [x]  ulcer on RUA AVF  Constitutional: []  fever []  chills  PHYSICAL EXAMINATION:  Today's Vitals   10/22/19 0923  BP: (!) 189/111  Pulse: 79  Resp: (!) 22  Temp: (!) 96.4 F (35.8 C)  TempSrc: Temporal  SpO2: 98%  Weight: 285 lb (129.3 kg)  Height: 5\' 3"  (1.6 m)   Body mass index is 50.49 kg/m.   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  There is a thrill and is easier to palpate distal to the aneurysmal area.  There is a bruit proximally.    Musculoskeletal: no muscle wasting or atrophy  Neurologic: A&O X 3; Moving all extremities equally;  Speech is fluent/normal  Non-Invasive Vascular Imaging:   None today   ASSESSMENT/PLAN: 44 y.o. female with ESRD here for evaluation of her hemodialysis access and has hx of:  She has dialysis access hx as follows: -right RC AVF with thrombectomy and ligation on 08/01/2012 (JDL) -creation of right BC AVF 08/01/2012 (JDL) -ligation of branches right BC AVF 11/05/2012 (JDL) -angioplasty right cephalic vein 01/11/1750 (WB) -TDC right IJ 09/19/2018 ([facility])   -pt does have a scabbed area over an aneurysmal area of her right arm fistula that has reportedly bled according to ER notes from last evening.  She denies any problems with bleeding  but is concerned about her fistula.  This area is at risk for bleeding and she will need a plication of her fistula and hopeful this can be scheduled for Friday.  She most likely will need a tunneled dialysis catheter as her fistula is not easy to palpate proximal to the aneurysmal area.   Her dialysis for Friday will most likely need to be re-scheduled.   -pt is on dialysis on M/W/F at the [place] location.  -pt is right hand dominant - will plan for plication of right arm fistula with possible insertion of tunneled dialysis catheter.  -pt is not on anticoagulation   Leontine Locket, [facility] [phone]  Clinic MD:   Oneida Alar

## 2019-10-21 NOTE — ED Triage Notes (Signed)
Pt here pov with reports of "leaking" dialysis site. Pt was unable to dialyze today due to the bleeding.

## 2019-10-21 NOTE — ED Notes (Signed)
Gave pt sandwich bag and gingerale 

## 2019-10-22 ENCOUNTER — Other Ambulatory Visit (HOSPITAL_COMMUNITY): Payer: Medicaid Other

## 2019-10-22 ENCOUNTER — Other Ambulatory Visit: Payer: Self-pay

## 2019-10-22 ENCOUNTER — Encounter (HOSPITAL_COMMUNITY): Payer: Self-pay | Admitting: Vascular Surgery

## 2019-10-22 ENCOUNTER — Ambulatory Visit (INDEPENDENT_AMBULATORY_CARE_PROVIDER_SITE_OTHER): Payer: Medicaid Other | Admitting: Physician Assistant

## 2019-10-22 VITALS — BP 189/111 | HR 79 | Temp 96.4°F | Resp 22 | Ht 63.0 in | Wt 285.0 lb

## 2019-10-22 DIAGNOSIS — N186 End stage renal disease: Secondary | ICD-10-CM | POA: Diagnosis not present

## 2019-10-22 DIAGNOSIS — Z992 Dependence on renal dialysis: Secondary | ICD-10-CM

## 2019-10-22 NOTE — Progress Notes (Signed)
Spoke with pt for pre-op call. Pt has hx of CHF and HTN. Pt seen in the ED yesterday and BP was 166/116. Today she was seen at Dr. Ainsley Spinner office and BP was 189/111. She states she is taking her Amlodipine, but states she's not on Carvedilol (it's on her med list). I instructed her that she needs to be sure to take her Amlodipine in the AM. She voiced understanding. I also instructed her to bring her medications or med list with her so we can update her med list. She again voiced understanding.  Pt missed going for her Covid test today. She will 3 hours prior to surgery to get her Covid test done on arrival.

## 2019-10-23 ENCOUNTER — Ambulatory Visit (HOSPITAL_COMMUNITY): Admission: RE | Admit: 2019-10-23 | Payer: Medicaid Other | Source: Home / Self Care | Admitting: Vascular Surgery

## 2019-10-23 ENCOUNTER — Encounter (HOSPITAL_COMMUNITY): Payer: Self-pay | Admitting: Certified Registered"

## 2019-10-23 HISTORY — DX: Unspecified osteoarthritis, unspecified site: M19.90

## 2019-10-23 SURGERY — LIGATION OF ARTERIOVENOUS  FISTULA
Anesthesia: Monitor Anesthesia Care | Laterality: Right

## 2019-10-23 MED ORDER — MIDAZOLAM HCL 2 MG/2ML IJ SOLN
INTRAMUSCULAR | Status: AC
  Filled 2019-10-23: qty 2

## 2019-10-23 MED ORDER — FENTANYL CITRATE (PF) 250 MCG/5ML IJ SOLN
INTRAMUSCULAR | Status: AC
  Filled 2019-10-23: qty 5

## 2019-10-23 MED ORDER — PROPOFOL 10 MG/ML IV BOLUS
INTRAVENOUS | Status: AC
  Filled 2019-10-23: qty 20

## 2019-10-23 MED ORDER — LIDOCAINE 2% (20 MG/ML) 5 ML SYRINGE
INTRAMUSCULAR | Status: AC
  Filled 2019-10-23: qty 5

## 2019-10-24 ENCOUNTER — Other Ambulatory Visit (HOSPITAL_COMMUNITY)
Admission: RE | Admit: 2019-10-24 | Discharge: 2019-10-24 | Disposition: A | Discharge status: Home / Self Care | Payer: Medicaid Other | Source: Ambulatory Visit | Attending: Vascular Surgery | Admitting: Vascular Surgery

## 2019-10-24 DIAGNOSIS — Z20822 Contact with and (suspected) exposure to covid-19: Secondary | ICD-10-CM | POA: Insufficient documentation

## 2019-10-24 DIAGNOSIS — Z01812 Encounter for preprocedural laboratory examination: Secondary | ICD-10-CM | POA: Insufficient documentation

## 2019-10-24 LAB — SARS CORONAVIRUS 2 (TAT 6-24 HRS): SARS Coronavirus 2: NEGATIVE

## 2019-10-26 ENCOUNTER — Other Ambulatory Visit: Payer: Self-pay

## 2019-10-26 MED ORDER — DEXTROSE 5 % IV SOLN
3.0000 g | INTRAVENOUS | Status: AC
  Administered 2019-10-27: 3 g via INTRAVENOUS
  Filled 2019-10-26: qty 3000
  Filled 2019-10-26: qty 3

## 2019-10-26 NOTE — Progress Notes (Signed)
Spoke with pt today for surgery tomorrow. Pt was scheduled for surgery this past Friday, May 21st. Pt states she overslept. I talked with her on Thursday and went over her medical and surgical histories. She states nothing has changed since I talked to her last week. Gave her time of arrival info and instructed her not to eat or drink after midnight tonight. She will not be taking any medications in the AM.  Covid test done 10/24/19. Pt states she has been quarantine since the test was done.

## 2019-10-27 ENCOUNTER — Inpatient Hospital Stay (HOSPITAL_COMMUNITY)
Admission: RE | Admit: 2019-10-27 | Discharge: 2019-11-03 | DRG: 264 | Disposition: E | Payer: Medicaid Other | Attending: Vascular Surgery | Admitting: Vascular Surgery

## 2019-10-27 ENCOUNTER — Other Ambulatory Visit: Payer: Self-pay

## 2019-10-27 ENCOUNTER — Encounter (HOSPITAL_COMMUNITY): Admission: RE | Disposition: E | Payer: Self-pay | Source: Home / Self Care | Attending: Vascular Surgery

## 2019-10-27 ENCOUNTER — Ambulatory Visit (HOSPITAL_COMMUNITY): Payer: Medicaid Other

## 2019-10-27 ENCOUNTER — Ambulatory Visit (HOSPITAL_COMMUNITY): Payer: Medicaid Other | Admitting: Certified Registered Nurse Anesthetist

## 2019-10-27 ENCOUNTER — Inpatient Hospital Stay (HOSPITAL_COMMUNITY): Payer: Medicaid Other

## 2019-10-27 ENCOUNTER — Encounter (HOSPITAL_COMMUNITY): Payer: Self-pay | Admitting: Vascular Surgery

## 2019-10-27 DIAGNOSIS — I462 Cardiac arrest due to underlying cardiac condition: Secondary | ICD-10-CM | POA: Diagnosis not present

## 2019-10-27 DIAGNOSIS — Z79899 Other long term (current) drug therapy: Secondary | ICD-10-CM

## 2019-10-27 DIAGNOSIS — R404 Transient alteration of awareness: Secondary | ICD-10-CM | POA: Diagnosis not present

## 2019-10-27 DIAGNOSIS — Z992 Dependence on renal dialysis: Secondary | ICD-10-CM | POA: Diagnosis not present

## 2019-10-27 DIAGNOSIS — I132 Hypertensive heart and chronic kidney disease with heart failure and with stage 5 chronic kidney disease, or end stage renal disease: Secondary | ICD-10-CM | POA: Diagnosis present

## 2019-10-27 DIAGNOSIS — Z6841 Body Mass Index (BMI) 40.0 and over, adult: Secondary | ICD-10-CM

## 2019-10-27 DIAGNOSIS — D631 Anemia in chronic kidney disease: Secondary | ICD-10-CM | POA: Diagnosis present

## 2019-10-27 DIAGNOSIS — F17210 Nicotine dependence, cigarettes, uncomplicated: Secondary | ICD-10-CM | POA: Diagnosis present

## 2019-10-27 DIAGNOSIS — N2581 Secondary hyperparathyroidism of renal origin: Secondary | ICD-10-CM | POA: Diagnosis present

## 2019-10-27 DIAGNOSIS — I472 Ventricular tachycardia: Secondary | ICD-10-CM | POA: Diagnosis present

## 2019-10-27 DIAGNOSIS — Z20822 Contact with and (suspected) exposure to covid-19: Secondary | ICD-10-CM | POA: Diagnosis present

## 2019-10-27 DIAGNOSIS — N186 End stage renal disease: Secondary | ICD-10-CM

## 2019-10-27 DIAGNOSIS — G4733 Obstructive sleep apnea (adult) (pediatric): Secondary | ICD-10-CM | POA: Diagnosis present

## 2019-10-27 DIAGNOSIS — R451 Restlessness and agitation: Secondary | ICD-10-CM | POA: Diagnosis present

## 2019-10-27 DIAGNOSIS — T82898A Other specified complication of vascular prosthetic devices, implants and grafts, initial encounter: Secondary | ICD-10-CM

## 2019-10-27 DIAGNOSIS — Z8249 Family history of ischemic heart disease and other diseases of the circulatory system: Secondary | ICD-10-CM

## 2019-10-27 DIAGNOSIS — Y712 Prosthetic and other implants, materials and accessory cardiovascular devices associated with adverse incidents: Secondary | ICD-10-CM | POA: Diagnosis present

## 2019-10-27 DIAGNOSIS — I5032 Chronic diastolic (congestive) heart failure: Secondary | ICD-10-CM | POA: Diagnosis present

## 2019-10-27 DIAGNOSIS — M170 Bilateral primary osteoarthritis of knee: Secondary | ICD-10-CM | POA: Diagnosis present

## 2019-10-27 DIAGNOSIS — T82868A Thrombosis of vascular prosthetic devices, implants and grafts, initial encounter: Principal | ICD-10-CM | POA: Diagnosis present

## 2019-10-27 DIAGNOSIS — R58 Hemorrhage, not elsewhere classified: Secondary | ICD-10-CM | POA: Diagnosis present

## 2019-10-27 DIAGNOSIS — T82510A Breakdown (mechanical) of surgically created arteriovenous fistula, initial encounter: Secondary | ICD-10-CM | POA: Diagnosis present

## 2019-10-27 DIAGNOSIS — R0682 Tachypnea, not elsewhere classified: Secondary | ICD-10-CM | POA: Diagnosis present

## 2019-10-27 DIAGNOSIS — Z8042 Family history of malignant neoplasm of prostate: Secondary | ICD-10-CM | POA: Diagnosis not present

## 2019-10-27 DIAGNOSIS — Z66 Do not resuscitate: Secondary | ICD-10-CM | POA: Diagnosis present

## 2019-10-27 DIAGNOSIS — R569 Unspecified convulsions: Secondary | ICD-10-CM | POA: Diagnosis present

## 2019-10-27 HISTORY — PX: LIGATION OF ARTERIOVENOUS  FISTULA: SHX5948

## 2019-10-27 HISTORY — PX: THROMBECTOMY W/ EMBOLECTOMY: SHX2507

## 2019-10-27 HISTORY — PX: INSERTION OF DIALYSIS CATHETER: SHX1324

## 2019-10-27 LAB — POCT I-STAT, CHEM 8
BUN: 57 mg/dL — ABNORMAL HIGH (ref 6–20)
Calcium, Ion: 1.06 mmol/L — ABNORMAL LOW (ref 1.15–1.40)
Chloride: 110 mmol/L (ref 98–111)
Creatinine, Ser: >18 mg/dL — ABNORMAL HIGH (ref 0.44–1.00)
Glucose, Bld: 85 mg/dL (ref 70–99)
HCT: 29 % — ABNORMAL LOW (ref 36.0–46.0)
Hemoglobin: 9.9 g/dL — ABNORMAL LOW (ref 12.0–15.0)
Potassium: 4.3 mmol/L (ref 3.5–5.1)
Sodium: 140 mmol/L (ref 135–145)
TCO2: 23 mmol/L (ref 22–32)

## 2019-10-27 SURGERY — LIGATION OF ARTERIOVENOUS  FISTULA
Anesthesia: Monitor Anesthesia Care | Laterality: Right

## 2019-10-27 MED ORDER — PHENOL 1.4 % MT LIQD: 1.0000 | OROMUCOSAL | Status: DC | PRN

## 2019-10-27 MED ORDER — AMLODIPINE BESYLATE 10 MG PO TABS
10.0000 mg | ORAL_TABLET | Freq: Every day | ORAL | Status: DC
  Administered 2019-10-27: 10 mg via ORAL
  Filled 2019-10-27: qty 1

## 2019-10-27 MED ORDER — FENTANYL CITRATE (PF) 250 MCG/5ML IJ SOLN
INTRAMUSCULAR | Status: AC
  Filled 2019-10-27: qty 5

## 2019-10-27 MED ORDER — CHLORHEXIDINE GLUCONATE 0.12 % MT SOLN
15.0000 mL | Freq: Once | OROMUCOSAL | Status: AC
  Administered 2019-10-27: 15 mL via OROMUCOSAL

## 2019-10-27 MED ORDER — SODIUM CHLORIDE 0.9 % IV SOLN: INTRAVENOUS | Status: DC

## 2019-10-27 MED ORDER — ESMOLOL HCL 100 MG/10ML IV SOLN
INTRAVENOUS | Status: AC
  Filled 2019-10-27: qty 10

## 2019-10-27 MED ORDER — GLYCOPYRROLATE PF 0.2 MG/ML IJ SOSY
PREFILLED_SYRINGE | INTRAMUSCULAR | Status: DC | PRN
  Administered 2019-10-27: .2 mg via INTRAVENOUS

## 2019-10-27 MED ORDER — SODIUM CHLORIDE 0.9 % IV SOLN
INTRAVENOUS | Status: AC
  Filled 2019-10-27: qty 1.2

## 2019-10-27 MED ORDER — ONDANSETRON HCL 4 MG/2ML IJ SOLN: 4.0000 mg | Freq: Four times a day (QID) | INTRAMUSCULAR | Status: DC | PRN

## 2019-10-27 MED ORDER — HYDRALAZINE HCL 20 MG/ML IJ SOLN: 5.0000 mg | INTRAMUSCULAR | Status: DC | PRN

## 2019-10-27 MED ORDER — OXYCODONE-ACETAMINOPHEN 5-325 MG PO TABS
1.0000 | ORAL_TABLET | ORAL | Status: DC | PRN
  Filled 2019-10-27: qty 2

## 2019-10-27 MED ORDER — PHENYLEPHRINE HCL-NACL 10-0.9 MG/250ML-% IV SOLN
INTRAVENOUS | Status: DC | PRN
  Administered 2019-10-27: 25 ug/min via INTRAVENOUS

## 2019-10-27 MED ORDER — ROCURONIUM BROMIDE 100 MG/10ML IV SOLN
INTRAVENOUS | Status: DC | PRN
  Administered 2019-10-27: 20 mg via INTRAVENOUS

## 2019-10-27 MED ORDER — PROPOFOL 10 MG/ML IV BOLUS
INTRAVENOUS | Status: DC | PRN
  Administered 2019-10-27: 20 mg via INTRAVENOUS
  Administered 2019-10-27: 150 mg via INTRAVENOUS

## 2019-10-27 MED ORDER — ACETAMINOPHEN 325 MG PO TABS
650.0000 mg | ORAL_TABLET | Freq: Once | ORAL | Status: AC
  Administered 2019-10-27: 650 mg via ORAL
  Filled 2019-10-27: qty 2

## 2019-10-27 MED ORDER — PROTAMINE SULFATE 10 MG/ML IV SOLN
INTRAVENOUS | Status: AC
  Filled 2019-10-27: qty 10

## 2019-10-27 MED ORDER — SEVELAMER CARBONATE 800 MG PO TABS: 800.0000 mg | ORAL_TABLET | Freq: Three times a day (TID) | ORAL | Status: DC

## 2019-10-27 MED ORDER — PROTAMINE SULFATE 10 MG/ML IV SOLN
INTRAVENOUS | Status: DC | PRN
Start: 2019-10-27 — End: 2019-10-27
  Administered 2019-10-27: 50 mg via INTRAVENOUS

## 2019-10-27 MED ORDER — ORAL CARE MOUTH RINSE: 15.0000 mL | Freq: Once | OROMUCOSAL | Status: AC

## 2019-10-27 MED ORDER — SODIUM CHLORIDE 0.9 % IV SOLN
INTRAVENOUS | Status: DC | PRN
  Administered 2019-10-27: 500 mL

## 2019-10-27 MED ORDER — SUCCINYLCHOLINE CHLORIDE 20 MG/ML IJ SOLN
INTRAMUSCULAR | Status: DC | PRN
  Administered 2019-10-27: 100 mg via INTRAVENOUS

## 2019-10-27 MED ORDER — ALBUTEROL SULFATE HFA 108 (90 BASE) MCG/ACT IN AERS
INHALATION_SPRAY | RESPIRATORY_TRACT | Status: DC | PRN
Start: 2019-10-27 — End: 2019-10-27
  Administered 2019-10-27 (×2): 2 via RESPIRATORY_TRACT

## 2019-10-27 MED ORDER — CHLORHEXIDINE GLUCONATE 0.12 % MT SOLN
OROMUCOSAL | Status: AC
  Filled 2019-10-27: qty 15

## 2019-10-27 MED ORDER — HYDROMORPHONE HCL 1 MG/ML IJ SOLN: 0.5000 mg | INTRAMUSCULAR | Status: DC | PRN

## 2019-10-27 MED ORDER — PROTAMINE SULFATE 10 MG/ML IV SOLN
INTRAVENOUS | Status: AC
  Filled 2019-10-27: qty 25

## 2019-10-27 MED ORDER — CHLORHEXIDINE GLUCONATE 4 % EX LIQD: 60.0000 mL | Freq: Once | CUTANEOUS | Status: DC

## 2019-10-27 MED ORDER — HEPARIN SODIUM (PORCINE) 1000 UNIT/ML IJ SOLN
INTRAMUSCULAR | Status: AC
  Filled 2019-10-27: qty 1

## 2019-10-27 MED ORDER — ALBUMIN HUMAN 5 % IV SOLN: INTRAVENOUS | Status: DC | PRN

## 2019-10-27 MED ORDER — FENTANYL CITRATE (PF) 100 MCG/2ML IJ SOLN
25.0000 ug | INTRAMUSCULAR | Status: DC | PRN
  Administered 2019-10-27: 25 ug via INTRAVENOUS

## 2019-10-27 MED ORDER — ALBUTEROL SULFATE HFA 108 (90 BASE) MCG/ACT IN AERS
INHALATION_SPRAY | RESPIRATORY_TRACT | Status: AC
  Filled 2019-10-27: qty 6.7

## 2019-10-27 MED ORDER — FENTANYL CITRATE (PF) 100 MCG/2ML IJ SOLN
INTRAMUSCULAR | Status: AC
  Filled 2019-10-27: qty 2

## 2019-10-27 MED ORDER — GLYCOPYRROLATE 0.2 MG/ML IJ SOLN
INTRAMUSCULAR | Status: DC | PRN
  Administered 2019-10-27: .4 mg via INTRAVENOUS

## 2019-10-27 MED ORDER — CINACALCET HCL 30 MG PO TABS: 30.0000 mg | ORAL_TABLET | ORAL | Status: DC

## 2019-10-27 MED ORDER — GUAIFENESIN-DM 100-10 MG/5ML PO SYRP: 15.0000 mL | ORAL_SOLUTION | ORAL | Status: DC | PRN

## 2019-10-27 MED ORDER — HEPARIN SODIUM (PORCINE) 1000 UNIT/ML IJ SOLN
INTRAMUSCULAR | Status: DC | PRN
Start: 2019-10-27 — End: 2019-10-27
  Administered 2019-10-27: 3000 [IU] via INTRAVENOUS

## 2019-10-27 MED ORDER — ONDANSETRON HCL 4 MG/2ML IJ SOLN
INTRAMUSCULAR | Status: DC | PRN
  Administered 2019-10-27: 4 mg via INTRAVENOUS

## 2019-10-27 MED ORDER — PANTOPRAZOLE SODIUM 40 MG PO TBEC
40.0000 mg | DELAYED_RELEASE_TABLET | Freq: Every day | ORAL | Status: DC
  Administered 2019-10-27: 40 mg via ORAL
  Filled 2019-10-27: qty 1

## 2019-10-27 MED ORDER — CHLORHEXIDINE GLUCONATE CLOTH 2 % EX PADS: 6.0000 | MEDICATED_PAD | Freq: Every day | CUTANEOUS | Status: DC

## 2019-10-27 MED ORDER — LABETALOL HCL 5 MG/ML IV SOLN: 10.0000 mg | INTRAVENOUS | Status: DC | PRN

## 2019-10-27 MED ORDER — NEOSTIGMINE METHYLSULFATE 10 MG/10ML IV SOLN
INTRAVENOUS | Status: DC | PRN
  Administered 2019-10-27: 3 mg via INTRAVENOUS

## 2019-10-27 MED ORDER — ACETAMINOPHEN 650 MG RE SUPP: 325.0000 mg | RECTAL | Status: DC | PRN

## 2019-10-27 MED ORDER — LIDOCAINE 2% (20 MG/ML) 5 ML SYRINGE
INTRAMUSCULAR | Status: DC | PRN
  Administered 2019-10-27: 40 mg via INTRAVENOUS
  Administered 2019-10-27: 60 mg via INTRAVENOUS

## 2019-10-27 MED ORDER — IODIXANOL 320 MG/ML IV SOLN
INTRAVENOUS | Status: DC | PRN
  Administered 2019-10-27: 50 mL

## 2019-10-27 MED ORDER — PROPOFOL 10 MG/ML IV BOLUS
INTRAVENOUS | Status: AC
  Filled 2019-10-27: qty 40

## 2019-10-27 MED ORDER — METOPROLOL TARTRATE 5 MG/5ML IV SOLN: 2.0000 mg | INTRAVENOUS | Status: DC | PRN

## 2019-10-27 MED ORDER — FENTANYL CITRATE (PF) 250 MCG/5ML IJ SOLN
INTRAMUSCULAR | Status: DC | PRN
  Administered 2019-10-27: 100 ug via INTRAVENOUS
  Administered 2019-10-27 (×4): 50 ug via INTRAVENOUS

## 2019-10-27 MED ORDER — ALUM & MAG HYDROXIDE-SIMETH 200-200-20 MG/5ML PO SUSP: 15.0000 mL | ORAL | Status: DC | PRN

## 2019-10-27 MED ORDER — 0.9 % SODIUM CHLORIDE (POUR BTL) OPTIME
TOPICAL | Status: DC | PRN
  Administered 2019-10-27: 1000 mL

## 2019-10-27 MED ORDER — CEFAZOLIN SODIUM-DEXTROSE 2-4 GM/100ML-% IV SOLN
2.0000 g | Freq: Three times a day (TID) | INTRAVENOUS | Status: AC
  Administered 2019-10-27 – 2019-10-28 (×2): 2 g via INTRAVENOUS
  Filled 2019-10-27 (×2): qty 100

## 2019-10-27 MED ORDER — ONDANSETRON HCL 4 MG/2ML IJ SOLN: 4.0000 mg | Freq: Once | INTRAMUSCULAR | Status: DC | PRN

## 2019-10-27 MED ORDER — ACETAMINOPHEN 325 MG PO TABS: 325.0000 mg | ORAL_TABLET | ORAL | Status: DC | PRN

## 2019-10-27 SURGICAL SUPPLY — 76 items
ADH SKN CLS APL DERMABOND .7 (GAUZE/BANDAGES/DRESSINGS) ×2
BAG DECANTER FOR FLEXI CONT (MISCELLANEOUS) ×2 IMPLANT
BIOPATCH RED 1 DISK 7.0 (GAUZE/BANDAGES/DRESSINGS) ×3 IMPLANT
BIOPATCH RED 1IN DISK 7.0MM (GAUZE/BANDAGES/DRESSINGS) ×1
BNDG ELASTIC 4X5.8 VLCR STR LF (GAUZE/BANDAGES/DRESSINGS) ×2 IMPLANT
BNDG GAUZE ELAST 4 BULKY (GAUZE/BANDAGES/DRESSINGS) ×2 IMPLANT
CANISTER SUCT 3000ML PPV (MISCELLANEOUS) ×4 IMPLANT
CATH EMB 4FR 40CM (CATHETERS) ×2 IMPLANT
CATH PALINDROME-P 19CM W/VT (CATHETERS) IMPLANT
CATH PALINDROME-P 23CM W/VT (CATHETERS) ×2 IMPLANT
CATH PALINDROME-P 28CM W/VT (CATHETERS) IMPLANT
CLIP VESOCCLUDE MED 6/CT (CLIP) ×4 IMPLANT
CLIP VESOCCLUDE SM WIDE 6/CT (CLIP) ×4 IMPLANT
COVER PROBE W GEL 5X96 (DRAPES) ×4 IMPLANT
COVER SURGICAL LIGHT HANDLE (MISCELLANEOUS) ×2 IMPLANT
COVER WAND RF STERILE (DRAPES) ×2 IMPLANT
DERMABOND ADVANCED (GAUZE/BANDAGES/DRESSINGS) ×2
DERMABOND ADVANCED .7 DNX12 (GAUZE/BANDAGES/DRESSINGS) ×2 IMPLANT
DEVICE TORQUE KENDALL .025-038 (MISCELLANEOUS) ×2 IMPLANT
DRAPE C-ARM 42X72 X-RAY (DRAPES) ×2 IMPLANT
DRAPE CHEST BREAST 15X10 FENES (DRAPES) ×4 IMPLANT
DRSG COVADERM 4X6 (GAUZE/BANDAGES/DRESSINGS) ×2 IMPLANT
DRSG XEROFORM 1X8 (GAUZE/BANDAGES/DRESSINGS) ×2 IMPLANT
ELECT REM PT RETURN 9FT ADLT (ELECTROSURGICAL) ×4
ELECTRODE REM PT RTRN 9FT ADLT (ELECTROSURGICAL) ×2 IMPLANT
GAUZE 4X4 16PLY RFD (DISPOSABLE) ×2 IMPLANT
GAUZE SPONGE 4X4 12PLY STRL LF (GAUZE/BANDAGES/DRESSINGS) ×2 IMPLANT
GAUZE XEROFORM 1X8 LF (GAUZE/BANDAGES/DRESSINGS) ×2 IMPLANT
GLIDEWIRE ADV .035X180CM (WIRE) ×2 IMPLANT
GLOVE BIO SURGEON STRL SZ7.5 (GLOVE) ×4 IMPLANT
GOWN STRL REUS W/ TWL LRG LVL3 (GOWN DISPOSABLE) ×4 IMPLANT
GOWN STRL REUS W/ TWL XL LVL3 (GOWN DISPOSABLE) ×2 IMPLANT
GOWN STRL REUS W/TWL LRG LVL3 (GOWN DISPOSABLE) ×4
GOWN STRL REUS W/TWL XL LVL3 (GOWN DISPOSABLE) ×12
GRAFT COLLAGEN VASCULAR 7X40 (Vascular Products) ×2 IMPLANT
GUIDEWIRE ANGLED .035X150CM (WIRE) ×2 IMPLANT
KIT BASIN OR (CUSTOM PROCEDURE TRAY) ×4 IMPLANT
KIT PALINDROME-P 55CM (CATHETERS) IMPLANT
KIT TURNOVER KIT B (KITS) ×4 IMPLANT
NDL 18GX1X1/2 (RX/OR ONLY) (NEEDLE) ×2 IMPLANT
NDL HYPO 25GX1X1/2 BEV (NEEDLE) ×2 IMPLANT
NEEDLE 18GX1X1/2 (RX/OR ONLY) (NEEDLE) IMPLANT
NEEDLE HYPO 25GX1X1/2 BEV (NEEDLE) IMPLANT
NS IRRIG 1000ML POUR BTL (IV SOLUTION) ×8 IMPLANT
PACK CV ACCESS (CUSTOM PROCEDURE TRAY) ×4 IMPLANT
PACK SURGICAL SETUP 50X90 (CUSTOM PROCEDURE TRAY) ×2 IMPLANT
PAD ARMBOARD 7.5X6 YLW CONV (MISCELLANEOUS) ×8 IMPLANT
SET MICROPUNCTURE 5F STIFF (MISCELLANEOUS) ×2 IMPLANT
SHEATH PINNACLE 5F 10CM (SHEATH) ×2 IMPLANT
SOAP 2 % CHG 4 OZ (WOUND CARE) ×2 IMPLANT
SPONGE LAP 18X18 RF (DISPOSABLE) ×8 IMPLANT
STAPLER VISISTAT 35W (STAPLE) ×4 IMPLANT
SUT ETHILON 3 0 PS 1 (SUTURE) ×6 IMPLANT
SUT MNCRL AB 4-0 PS2 18 (SUTURE) ×8 IMPLANT
SUT PROLENE 5 0 C 1 24 (SUTURE) ×8 IMPLANT
SUT PROLENE 6 0 BV (SUTURE) IMPLANT
SUT SILK 0 TIES 10X30 (SUTURE) ×2 IMPLANT
SUT SILK 2 0 SH (SUTURE) ×2 IMPLANT
SUT VIC AB 2-0 CT1 27 (SUTURE) ×12
SUT VIC AB 2-0 CT1 TAPERPNT 27 (SUTURE) IMPLANT
SUT VIC AB 3-0 SH 27 (SUTURE) ×8
SUT VIC AB 3-0 SH 27X BRD (SUTURE) ×2 IMPLANT
SYR 10ML LL (SYRINGE) ×4 IMPLANT
SYR 20ML LL LF (SYRINGE) ×6 IMPLANT
SYR 3ML LL SCALE MARK (SYRINGE) ×2 IMPLANT
SYR 5ML LL (SYRINGE) ×4 IMPLANT
SYR CONTROL 10ML LL (SYRINGE) ×2 IMPLANT
SYR TOOMEY 50ML (SYRINGE) ×2 IMPLANT
TOWEL GREEN STERILE (TOWEL DISPOSABLE) ×4 IMPLANT
TOWEL GREEN STERILE FF (TOWEL DISPOSABLE) ×8 IMPLANT
TUBING CIL FLEX 10 FLL-RA (TUBING) ×2 IMPLANT
UNDERPAD 30X36 HEAVY ABSORB (UNDERPADS AND DIAPERS) ×4 IMPLANT
WATER STERILE IRR 1000ML POUR (IV SOLUTION) ×4 IMPLANT
WIRE AMPLATZ SS-J .035X180CM (WIRE) ×2 IMPLANT
WIRE BENTSON .035X145CM (WIRE) ×2 IMPLANT
WIRE TORQFLEX AUST .018X40CM (WIRE) ×2 IMPLANT

## 2019-10-27 NOTE — Anesthesia Procedure Notes (Signed)
Procedure Name: Intubation Date/Time: 10/10/2019 11:08 AM Performed by: Alain Marion, CRNA Pre-anesthesia Checklist: Patient identified, Emergency Drugs available, Suction available and Patient being monitored Patient Re-evaluated:Patient Re-evaluated prior to induction Oxygen Delivery Method: Circle System Utilized Preoxygenation: Pre-oxygenation with 100% oxygen Induction Type: IV induction Ventilation: Mask ventilation without difficulty Laryngoscope Size: Miller and 2 Grade View: Grade I Tube type: Oral Tube size: 7.0 mm Number of attempts: 1 Airway Equipment and Method: Stylet Placement Confirmation: ETT inserted through vocal cords under direct vision,  positive ETCO2 and breath sounds checked- equal and bilateral Secured at: 22 cm Tube secured with: Tape Dental Injury: Teeth and Oropharynx as per pre-operative assessment

## 2019-10-27 NOTE — H&P (Signed)
   History and Physical Update  The patient was interviewed and re-examined.  The patient's previous History and Physical has been reviewed and is unchanged from recent office visit. There is one area of ulceration that has reportedly bled after hd in the past. The fistula is also quite pulsatile in the proximal part but there is a thrill higher up. We plan to revise the fistula likely with interposition graft and she should have significant room to continue hd in the large area near the antecubitum.   Brittney Naeem C. Donzetta Matters, MD [facility] [place] Office: [phone] Pager: [phone]   10/26/2019, 10:21 AM

## 2019-10-27 NOTE — Anesthesia Postprocedure Evaluation (Signed)
Anesthesia Post Note  Patient: Brittney Contreras  Procedure(s) Performed: Right upper extremity arteriovenous fistula revision with artegraft (Right ) Thrombectomy Right Upper Arm Arteriovenous Fistula Insertion Of Tunneled Dialysis Catheter in the Right Internal Jugular     Patient location during evaluation: PACU Anesthesia Type: MAC Level of consciousness: awake Pain management: pain level controlled Vital Signs Assessment: post-procedure vital signs reviewed and stable Respiratory status: spontaneous breathing, nonlabored ventilation, respiratory function stable and patient connected to nasal cannula oxygen Cardiovascular status: blood pressure returned to baseline and stable Postop Assessment: no apparent nausea or vomiting Anesthetic complications: no    Last Vitals:  Vitals:   10/16/2019 1730 10/04/2019 1805  BP: 126/87 (!) 126/91  Pulse: 85 85  Resp: 18 18  Temp:  36.8 C  SpO2: 98% 100%    Last Pain:  Vitals:   10/18/2019 1805  TempSrc: Oral  PainSc:                  Genoa Freyre P Zelpha Messing

## 2019-10-27 NOTE — Progress Notes (Signed)
When going to start patient's IV she stated, "If you hurt me starting my IV, I will kill you." I told patient that threats to staff would not be tolerated under any circumstances and ask if I could continue looking for an IV site. I explained with her medical condition an IV may be difficult but that I would not intentionally hurt patient. Patient agreed to let me continue with IV start.

## 2019-10-27 NOTE — Transfer of Care (Signed)
Immediate Anesthesia Transfer of Care Note  Patient: Brittney Contreras  Procedure(s) Performed: Right upper extremity arteriovenous fistula revision with artegraft (Right ) Thrombectomy Right Upper Arm Arteriovenous Fistula Insertion Of Tunneled Dialysis Catheter in the Right Internal Jugular  Patient Location: PACU  Anesthesia Type:General  Level of Consciousness: awake, alert  and oriented  Airway & Oxygen Therapy: Patient Spontanous Breathing and Patient connected to face mask oxygen  Post-op Assessment: Report given to RN and Post -op Vital signs reviewed and stable  Post vital signs: Reviewed and stable  Last Vitals:  Vitals Value Taken Time  BP 200/97 10/08/2019 1442  Temp    Pulse 91 10/30/2019 1449  Resp 23 10/21/2019 1449  SpO2 100 % 10/17/2019 1449  Vitals shown include unvalidated device data.  Last Pain:  Vitals:   11/02/2019 0922  TempSrc:   PainSc: 0-No pain         Complications: No apparent anesthesia complications

## 2019-10-27 NOTE — Anesthesia Preprocedure Evaluation (Addendum)
Anesthesia Evaluation  Patient identified by MRN, date of birth, ID band Patient awake    Reviewed: Allergy & Precautions, NPO status , Patient's Chart, lab work & pertinent test results  Airway Mallampati: IV  TM Distance: >3 FB Neck ROM: Full    Dental no notable dental hx.    Pulmonary sleep apnea , Current Smoker and Patient abstained from smoking.,    Pulmonary exam normal breath sounds clear to auscultation       Cardiovascular hypertension, Pt. on medications +CHF  Normal cardiovascular exam Rhythm:Regular Rate:Normal  ECG: NSR, rate 85  ECHO: Nuclear stress EF: 47%. Minimally reduced ejection fraction, no obvious wall motion abnormalities. There is increased wall thickness suggestive of hypertrophic cardiomyopathy. There was no ST segment deviation noted during stress. There is decreased uptake noted at rest along the base to mid inferior wall distribution however stress images show improved uptake in that region. Overall no significant ischemia identified. Low risk study.     Neuro/Psych negative neurological ROS  negative psych ROS   GI/Hepatic negative GI ROS, Neg liver ROS,   Endo/Other  Morbid obesity  Renal/GU ESRF and DialysisRenal diseaseOn HD M, W, F     Musculoskeletal negative musculoskeletal ROS (+)   Abdominal   Peds  Hematology negative hematology ROS (+) anemia ,   Anesthesia Other Findings END STAGE RENAL DISEASE  Reproductive/Obstetrics hcg negative                            Anesthesia Physical Anesthesia Plan  ASA: IV  Anesthesia Plan: General   Post-op Pain Management:    Induction: Intravenous  PONV Risk Score and Plan: 2 and Ondansetron, Dexamethasone and Treatment may vary due to age or medical condition  Airway Management Planned: Oral ETT  Additional Equipment:   Intra-op Plan:   Post-operative Plan: Extubation in OR  Informed Consent: I  have reviewed the patients History and Physical, chart, labs and discussed the procedure including the risks, benefits and alternatives for the proposed anesthesia with the patient or authorized representative who has indicated his/her understanding and acceptance.     Dental advisory given  Plan Discussed with: CRNA  Anesthesia Plan Comments:       Anesthesia Quick Evaluation

## 2019-10-27 NOTE — Op Note (Signed)
Patient name: Brittney Contreras MRN: [id number] DOB: January 11, 1976 Sex: female  10/16/2019 Pre-operative Diagnosis: End-stage renal disease, malfunction and bleeding right upper arm AV fistula Post-operative diagnosis:  Same Surgeon:  Eda Paschal. Donzetta Matters, MD Assistant: Arlee Muslim, PA Procedure Performed: 1.  Revision of right arm AV fistula with excision of fistula and placement of interposition Artegraft 2.  Right upper extremity thrombectomy with 4 French Fogarty 3.  Ultrasound-guided cannulation left IJ vein 4.  Central venogram. 5.  Placement of right IJ 23 cm tunneled dialysis catheter with ultrasound and fluoroscopic guidance   Indications: 44 year old female who has bleeding and difficulty cannulating her very large and serpiginous fistula in her right upper extremity.  Due to the bleeding and ulcerated area she was scheduled for revision.  She does have significant pain and difficulty with cannulation given the thrombus lining calcified nature of the fistula.  She is now indicated for revision and possible catheter placement.  Prior to this procedure we did discuss removing the entire fistula given its size and discomfort.  Findings: Fistula was heavily calcified and heavily thrombus laden.  Through 3 separate skip incisions we removed the entirety of the fistula up to the cephalic arch where it was somewhat smaller.  We dissected back to the arterial anastomosis.  Unfortunately we did not have any palpable pulsatility we were passed a 4 Fogarty we did return some minimal thrombus which was likely embolized with manipulation of the fistula.  At completion we did have good pulsatility in the new graft.  There was weak radial artery signal at the wrist I did augment with compression of the graft.  And placement of IJ tunneled dialysis catheter the left IJ was patent high in the neck I was able to cannulate this but a wire would not pass centrally and it was clearly occluded further down the neck.   Similarly on the right side the IJ was occluded in the neck however the innominate where the subclavian joint did appear patent I was able to cannulate this in place a tunneled dialysis catheter that was 23 cm long to the SVC atrial junction.   Procedure:  The patient was identified in the holding area and taken to the operating room where she is placed supine operative when general anesthesia induced.  She was sterilely prepped and draped in the right upper extremity usual fashion antibiotics were administered timeout was called.  We began making a transverse incision in the upper extremity near the axilla.  We dissected down to the fistula itself placed a vessel loop around this.  We dissected out much of the serpiginous fistula through this incision.  We then made an elliptical type incision around the ulcerated area in the mid upper arm.  We dissected out significant area of the fistula there as well.  We then made an incision near the arterial anastomosis dissected out both the artery proximally and distally as well as the fistula.  Was heavily calcified there.  We actually clamped it there and transected it.  We checked for a pulse unfortunately cannot find one I was concerned that we had embolized some material from the fistula with mobilizing it.  We then mobilized the entire to the fistula.  Given the size we did have to move it in a couple different segments.  We did this with a combination of cautery and scissors.  When the entire fistula was removed we then turned our attention to the new graft.  First we heparinized  the patient with 5000 units of heparin.  We had good strong antegrade bleeding from the brachial artery.  We passed a 4 Fogarty distally on 2 occasions.  We did return some minimal calcification get had good backbleeding we irrigated with heparinized saline.  The arteries were reclamped.  We marked the cephalic vein at the arch for orientation.  We then beveled the Artegraft and sewed and  and with 5-0 Prolene suture.  Upon completion we then flushed through the graft.  We tunneled the graft just under the skin more medially on the arm.  At our previous anastomosis we then spatulated the graft and sewed end-to-side with 5-0 Prolene suture.  Prior to completion we let flushing all directions.  Upon completion there was very good signal and pulsatility in the graft.  We could only find weak radial artery signal at the wrist no ulnar artery signal.  Patient's hand did look well perfused.  50 mg of protamine was administered.  We turned our attention towards the upper arm.  We then obtain hemostasis irrigated the wounds and closed in layers with Vicryl and staples at the level of the skin.  We then turned our attention towards placing a catheter.  The bilateral neck and chest was prepped and draped.  Ultrasound was used to identify the left IJ in the right IJ both appeared occluded lower in the neck.  I then began with cannulating the left IJ with ultrasound guidance with 18-gauge needle and I attempted to pass a J-wire I could not.  I then used a micropuncture needle could not get a micropuncture wire to pass either I ultimately cannulated again with 18-gauge needle and placed a J-wire a few centimeters was able to get a 5 French sheath in place attempted to perform venography but this was clearly not in the vein was in the subcutaneous tissue.  The sheath was removed there.  There was no bleeding the pressure was held for a few minutes.  I turned my attention of the right neck.  I attempted to cannulate in the mid neck where it appeared patent but the wire would not pass.  I then turned my attention further down below the clavicle was able to cannulate which was likely just at the IJ and subclavian junction with a micropuncture needle and passed to wire distally.  I placed the sheath and perform central venography all this appeared patent.  I placed an Amplatz into the IVC.  I then tunneled a 23 cm  catheter via counterincision.  I serially dilated by wire tract in place introducer sheath.  I then placed the the catheter to the SVC atrial junction.  It was flushed with heparinized saline locked with 1.6 cc of concentrated heparin either port.  Was affixed to the skin with 3-0 nylon suture.  Neck incision was closed with 4-0 Monocryl.  Dermabond placed at both sites.  She was awake from anesthesia having tolerated procedure without any complication.  Counts were correct at completion.  EBL: 600 cc  Contrast: 10 cc     Deago Burruss C. Donzetta Matters, MD [facility] [place] Office: [phone] Pager: [phone]

## 2019-10-28 ENCOUNTER — Inpatient Hospital Stay (HOSPITAL_COMMUNITY): Payer: Medicaid Other | Admitting: Anesthesiology

## 2019-10-28 LAB — COMPREHENSIVE METABOLIC PANEL
ALT: 60 U/L — ABNORMAL HIGH (ref 0–44)
AST: 96 U/L — ABNORMAL HIGH (ref 15–41)
Albumin: 3.4 g/dL — ABNORMAL LOW (ref 3.5–5.0)
Alkaline Phosphatase: 26 U/L — ABNORMAL LOW (ref 38–126)
Anion gap: 19 — ABNORMAL HIGH (ref 5–15)
BUN: 71 mg/dL — ABNORMAL HIGH (ref 6–20)
CO2: 17 mmol/L — ABNORMAL LOW (ref 22–32)
Calcium: 8.2 mg/dL — ABNORMAL LOW (ref 8.9–10.3)
Chloride: 104 mmol/L (ref 98–111)
Creatinine, Ser: 17.66 mg/dL — ABNORMAL HIGH (ref 0.44–1.00)
GFR calc Af Amer: 2 mL/min — ABNORMAL LOW (ref >60)
GFR calc non Af Amer: 2 mL/min — ABNORMAL LOW (ref >60)
Glucose, Bld: 144 mg/dL — ABNORMAL HIGH (ref 70–99)
Potassium: 5.3 mmol/L — ABNORMAL HIGH (ref 3.5–5.1)
Sodium: 140 mmol/L (ref 135–145)
Total Bilirubin: 0.5 mg/dL (ref 0.3–1.2)
Total Protein: 6 g/dL — ABNORMAL LOW (ref 6.5–8.1)

## 2019-10-28 LAB — CBC
HCT: 18.5 % — ABNORMAL LOW (ref 36.0–46.0)
Hemoglobin: 6.2 g/dL — CL (ref 12.0–15.0)
MCH: 32.5 pg (ref 26.0–34.0)
MCHC: 33.5 g/dL (ref 30.0–36.0)
MCV: 96.9 fL (ref 80.0–100.0)
Platelets: 85 10*3/uL — ABNORMAL LOW (ref 150–400)
RBC: 1.91 MIL/uL — ABNORMAL LOW (ref 3.87–5.11)
RDW: 14.7 % (ref 11.5–15.5)
WBC: 9.2 10*3/uL (ref 4.0–10.5)
nRBC: 0.4 % — ABNORMAL HIGH (ref 0.0–0.2)

## 2019-10-28 LAB — PHOSPHORUS: Phosphorus: 8.1 mg/dL — ABNORMAL HIGH (ref 2.5–4.6)

## 2019-10-28 LAB — PREPARE RBC (CROSSMATCH)

## 2019-10-28 MED ORDER — EPINEPHRINE HCL 5 MG/250ML IV SOLN IN NS
0.5000 ug/min | INTRAVENOUS | Status: DC
  Filled 2019-10-28: qty 250

## 2019-10-28 MED ORDER — SODIUM CHLORIDE 0.9% IV SOLUTION: Freq: Once | INTRAVENOUS | Status: DC

## 2019-10-28 MED ORDER — DOXERCALCIFEROL 4 MCG/2ML IV SOLN
7.0000 ug | INTRAVENOUS | Status: DC
  Filled 2019-10-28: qty 4

## 2019-10-28 MED ORDER — LORAZEPAM 2 MG/ML IJ SOLN
INTRAMUSCULAR | Status: AC
  Filled 2019-10-28: qty 1

## 2019-10-28 MED ORDER — DARBEPOETIN ALFA 25 MCG/0.42ML IJ SOSY: 25.0000 ug | PREFILLED_SYRINGE | INTRAMUSCULAR | Status: DC

## 2019-10-28 MED FILL — Medication: Qty: 1 | Status: AC

## 2019-10-29 ENCOUNTER — Encounter (HOSPITAL_COMMUNITY): Payer: Self-pay | Admitting: Certified Registered"

## 2019-10-29 LAB — BPAM RBC
Blood Product Expiration Date: 202105272359
Blood Product Expiration Date: 202106012359
Blood Product Expiration Date: 202106032359
ISSUE DATE / TIME: 202105260813
ISSUE DATE / TIME: 202105260813
ISSUE DATE / TIME: 202105262339
Unit Type and Rh: 9500
Unit Type and Rh: 9500
Unit Type and Rh: 9500

## 2019-10-29 LAB — TYPE AND SCREEN
Unit division: 0
Unit division: 0
Unit division: 0

## 2019-11-03 NOTE — Consult Note (Addendum)
Referring Provider: No ref. provider found Primary Care Physician:  Ladell Pier, MD Primary Nephrologist:  Dr. Carolynne Edouard  Reason for Consultation: Medical management end-stage renal disease, maintenance of euvolemia, assessment assessment and treatment of anemia, assessment and treatment of secondary hyperparathyroidism.  HPI: 44 year old lady Monday Wednesday Friday dialysis [facility]. Appears to have missed dialysis since 10/19/2019. She presented with bleeding from the right upper arm AV fistula and underwent revision of right arm AV fistula with excision of fistula and placement of interposition graft 10/06/2019. She also underwent placement of a right IJ 23 cm tunneled dialysis cath under fluoroscopic guidance. Appreciate assistance of Dr. Siri Cole.  Blood pressure 113/54 pulse 91 temperature 97.4 O2 sats 100% nasal cannula  Sodium 140 potassium 4.3 chloride 110 CO2 21 BUN 57 creatinine greater than 18 glucose 85 hemoglobin 9.9  Amlodipine 10 mg daily, cinacalcet 30 mg Monday and Thursday, Renvela 800 mg 3 times daily Protonix 40 mg daily  Past Medical History:  Diagnosis Date  . Anemia   . Arthritis    knees  . Chronic kidney disease    Dialysis on M/W/F  . Diastolic heart failure: Grade 3  07/25/2012  . Hypertension   . Hypertensive hypertrophic cardiomyopathy ([place]) 07/28/2012  . Malignant hypertension with congestive heart failure ([place]) 07/25/2012  . Obesity, morbid ([place]) 07/25/2012  . Obstructive sleep apnea    does not use a cpap  . Pneumonia   . Tobacco abuse 07/27/2012    Past Surgical History:  Procedure Laterality Date  . AV FISTULA PLACEMENT Right 08/01/2012   Procedure: ARTERIOVENOUS (AV) FISTULA CREATION;  Surgeon: Mal Misty, MD;  Location: [place];  Service: Vascular;  Laterality: Right;  Ultrasound guided; Attempted wrist AV Fistula Creation.  . AV FISTULA PLACEMENT Right 07/2012  . INSERTION OF DIALYSIS CATHETER Right 09/19/2018   Procedure:  INSERTION OF DIALYSIS CATHETER;  Surgeon: [name], MD;  Location: [place];  Service: Vascular;  Laterality: Right;  . LIGATION OF COMPETING BRANCHES OF ARTERIOVENOUS FISTULA Right 11/05/2012   Procedure: LIGATION OF COMPETING BRANCHES OF ARTERIOVENOUS FISTULA;  Surgeon: Mal Misty, MD;  Location: [place];  Service: Vascular;  Laterality: Right;  . SHUNTOGRAM Right 12/30/2012   Procedure: FISTULOGRAM;  Surgeon: Serafina Mitchell, MD;  Location: [facility] CATH LAB;  Service: Cardiovascular;  Laterality: Right;    Prior to Admission medications   Medication Sig Start Date End Date Taking? Authorizing Provider  amLODipine (NORVASC) 10 MG tablet Take 10 mg by mouth at bedtime.  08/04/19  Yes [provider]  cinacalcet (SENSIPAR) 30 MG tablet Take 30 mg by mouth 2 (two) times a week. Tuesdays & Thursdays. 10/05/19  Yes [provider]  sevelamer (RENAGEL) 800 MG tablet Take 800-1,600 mg by mouth See admin instructions. Take 2 tablets (1600 mg) by mouth with each meal & take 1 tablet (800 mg) by mouth with a snack 10/05/19  Yes [provider]    Current Facility-Administered Medications  Medication Dose Route Frequency Provider Last Rate Last Admin  . acetaminophen (TYLENOL) tablet 325-650 mg  325-650 mg Oral Q4H PRN Dagoberto Ligas, PA-C       Or  . acetaminophen (TYLENOL) suppository 325-650 mg  325-650 mg Rectal Q4H PRN Dagoberto Ligas, PA-C      . alum & mag hydroxide-simeth (MAALOX/MYLANTA) 200-200-20 MG/5ML suspension 15-30 mL  15-30 mL Oral Q2H PRN Dagoberto Ligas, PA-C      . amLODipine (NORVASC) tablet 10 mg  10 mg  Oral QHS Dagoberto Ligas, PA-C   10 mg at 10/13/2019 2310  . ceFAZolin (ANCEF) IVPB 2g/100 mL premix  2 g Intravenous Q8H Dagoberto Ligas, PA-C 200 mL/hr at Oct 30, 2019 0606 2 g at 10/30/2019 0606  . Chlorhexidine Gluconate Cloth 2 % PADS 6 each  6 each Topical Daily [name], MD      . Derrill Memo ON 10/29/2019] cinacalcet (SENSIPAR) tablet  30 mg  30 mg Oral Once per day on Mon Thu Eveland, Matthew, PA-C      . guaiFENesin-dextromethorphan (ROBITUSSIN DM) 100-10 MG/5ML syrup 15 mL  15 mL Oral Q4H PRN Dagoberto Ligas, PA-C      . hydrALAZINE (APRESOLINE) injection 5 mg  5 mg Intravenous Q20 Min PRN Dagoberto Ligas, PA-C      . HYDROmorphone (DILAUDID) injection 0.5-1 mg  0.5-1 mg Intravenous Q2H PRN Dagoberto Ligas, PA-C      . labetalol (NORMODYNE) injection 10 mg  10 mg Intravenous Q10 min PRN Dagoberto Ligas, PA-C      . metoprolol tartrate (LOPRESSOR) injection 2-5 mg  2-5 mg Intravenous Q2H PRN Dagoberto Ligas, PA-C      . ondansetron (ZOFRAN) injection 4 mg  4 mg Intravenous Q6H PRN Dagoberto Ligas, PA-C      . oxyCODONE-acetaminophen (PERCOCET/ROXICET) 5-325 MG per tablet 1-2 tablet  1-2 tablet Oral Q4H PRN Dagoberto Ligas, PA-C      . pantoprazole (PROTONIX) EC tablet 40 mg  40 mg Oral Daily Dagoberto Ligas, PA-C   40 mg at 10/31/2019 2309  . phenol (CHLORASEPTIC) mouth spray 1 spray  1 spray Mouth/Throat PRN Dagoberto Ligas, PA-C      . sevelamer carbonate (RENVELA) tablet 800 mg  800 mg Oral TID WC Dagoberto Ligas, PA-C        Allergies as of 10/23/2019  . (No Known Allergies)    Family History  Problem Relation Age of Onset  . Hypertension Mother   . Heart failure Mother   . Prostate cancer Father   . Kidney disease Other        Aunt, deceased, had been on dialysis    Social History   Socioeconomic History  . Marital status: Single    Spouse name: Not on file  . Number of children: Not on file  . Years of education: Not on file  . Highest education level: Not on file  Occupational History  . Not on file  Tobacco Use  . Smoking status: Current Every Day Smoker    Years: 20.00    Types: Cigarettes  . Smokeless tobacco: Never Used  . Tobacco comment: Smokes 4 cigarettes per day  Substance and Sexual Activity  . Alcohol use: Not Currently    Comment: occ  . Drug use: No    Comment: No hx of IV  Use  . Sexual activity: Not on file  Other Topics Concern  . Not on file  Social History Narrative   Lives with her uncle   Was previously a Quarry manager but currently [employer]         Social Determinants of Health   Financial Resource Strain:   . Difficulty of Paying Living Expenses:   Food Insecurity:   . Worried About Charity fundraiser in the Last Year:   . Arboriculturist in the Last Year:   Transportation Needs:   . Film/video editor (Medical):   Marland Kitchen Lack of Transportation (Non-Medical):   Physical Activity:   . Days of Exercise per Week:   .  Minutes of Exercise per Session:   Stress:   . Feeling of Stress :   Social Connections:   . Frequency of Communication with Friends and Family:   . Frequency of Social Gatherings with Friends and Family:   . Attends Religious Services:   . Active Member of Clubs or Organizations:   . Attends Archivist Meetings:   Marland Kitchen Marital Status:   Intimate Partner Violence:   . Fear of Current or Ex-Partner:   . Emotionally Abused:   Marland Kitchen Physically Abused:   . Sexually Abused:     Review of Systems: Gen: Denies any fever, chills, sweats, anorexia, fatigue, weakness, malaise, weight loss, and sleep disorder HEENT: No visual complaints, No history of Retinopathy. Normal external appearance No Epistaxis or Sore throat. No sinusitis.   CV: Denies chest pain, angina, palpitations, syncope, orthopnea, PND, peripheral edema, and claudication. Resp: Denies dyspnea at rest, dyspnea with exercise, cough, sputum, wheezing, coughing up blood, and pleurisy. GI: Denies vomiting blood, jaundice, and fecal incontinence.   Denies dysphagia or odynophagia. GU : Denies urinary burning, blood in urine, urinary frequency, urinary hesitancy, nocturnal urination, and urinary incontinence.  No renal calculi. MS: Denies joint pain, limitation of movement, and swelling, stiffness, low back pain, extremity pain. Denies muscle weakness, cramps, atrophy.  No use  of non steroidal antiinflammatory drugs. Derm: Denies rash, itching, dry skin, hives, moles, warts, or unhealing ulcers.  Psych: Denies depression, anxiety, memory loss, suicidal ideation, hallucinations, paranoia, and confusion. Heme: Denies bruising, bleeding, and enlarged lymph nodes. Neuro: No headache.  No diplopia. No dysarthria.  No dysphasia.  No history of CVA.  No Seizures. No paresthesias.  No weakness. Endocrine No DM.  No Thyroid disease.  No Adrenal disease.  Physical Exam: Vital signs in last 24 hours: Temp:  [96.6 F (35.9 C)-98.2 F (36.8 C)] 97.4 F (36.3 C) (05/26 0545) Pulse Rate:  [78-92] 91 (05/26 0545) Resp:  [18-24] 20 (05/26 0545) BP: (113-200)/(54-100) 113/54 (05/26 0545) SpO2:  [92 %-100 %] 100 % (05/26 0545) Weight:  [124.2 kg-124.3 kg] 124.3 kg (05/25 2205)   General:   Alert,  Well-developed, well-nourished, pleasant and cooperative in NAD Head:  Normocephalic and atraumatic. Eyes:  Sclera clear, no icterus.   Conjunctiva pink. Ears:  Normal auditory acuity. Nose:  No deformity, discharge,  or lesions. Mouth:  No deformity or lesions, dentition normal. Neck:  Supple; no masses or thyromegaly. JVP not elevated. Right IJ cath Lungs:  Clear throughout to auscultation.   No wheezes, crackles, or rhonchi. No acute distress. Heart:  Regular rate and rhythm; no murmurs, clicks, rubs,  or gallops. Abdomen:  Soft, nontender and nondistended. No masses, hepatosplenomegaly or hernias noted. Normal bowel sounds, without guarding, and without rebound.   Msk:  Symmetrical without gross deformities. Normal posture. Pulses:  No carotid, renal, femoral bruits. DP and PT symmetrical and equal Extremities:  Without clubbing or edema. Dressing right arm Neurologic:  Alert and  oriented x4;  grossly normal neurologically. Skin:  Intact without significant lesions or rashes.    Intake/Output from previous day: 05/25 0701 - 05/26 0700 In: 1390 [P.O.:240; I.V.:700; IV  Piggyback:450] Out: 600 [Blood:600] Intake/Output this shift: Total I/O In: 440 [P.O.:240; IV Piggyback:200] Out: 0   Lab Results: Recent Labs    10/21/2019 0932  HGB 9.9*  HCT 29.0*   BMET Recent Labs    10/22/2019 0932  NA 140  K 4.3  CL 110  GLUCOSE 85  BUN 57*  CREATININE >  18.00*   LFT No results for input(s): PROT, ALBUMIN, AST, ALT, ALKPHOS, BILITOT, BILIDIR, IBILI in the last 72 hours. PT/INR No results for input(s): LABPROT, INR in the last 72 hours. Hepatitis Panel No results for input(s): HEPBSAG, HCVAB, HEPAIGM, HEPBIGM in the last 72 hours.  Studies/Results: DG CHEST PORT 1 VIEW  Result Date: 10/21/2019 CLINICAL DATA:  Vascular dialysis catheter in place EXAM: PORTABLE CHEST 1 VIEW COMPARISON:  09/19/2018 FINDINGS: Single frontal view of the chest demonstrates right internal jugular dialysis catheter tip overlying the right atrium. Cardiac silhouette is enlarged but stable. No airspace disease, effusion, or pneumothorax. No acute bony abnormalities. IMPRESSION: 1. Dialysis catheter as above.  No acute process. Electronically Signed   By: Randa Ngo M.D.   On: 10/09/2019 15:18   HYBRID OR IMAGING ([facility] ONLY)  Result Date: 10/29/2019 There is no interpretation for this exam.  This order is for images obtained during a surgical procedure.  Please See "Surgeries" Tab for more information regarding the procedure.   Dialysis prescription duration 4 hours blood flow rate 400 dialysis flow rate 800 potassium 2 calcium 2. Postdialysis blood pressure is 195/109. Has missed 9 dialysis treatments and last 30 days 12 dialysis treatments in the last 60 days. Estimated dry weight of 124.5 kg  Medications darbepoetin 25 mcg every 2 weeks last administered 09/16/2019. PTH 745 phosphorus 5.6 calcium 8.8 10/14/2019. Doxercalciferol 7 mcg q. dialysis.  Assessment/Plan:  ESRD-Monday Wednesday Friday dialysis. We will continue dialysis treatment as prescribed. She will dialyze  through her right IJ catheter  ANEMIA-slight drop in hemoglobin noted. This followed surgery for plication of aneurysm 10/29/2019. Will give dose of Aranesp 25 mcg 11-03-19  MBD-continue binders and oral medications once on diet. We will continue Hectorol 7 mcg q. dialysis  HTN/VOL-we will continue to follow dry weight and challenge appropriately  ACCESS-appreciate assistance of Dr. Siri Cole  At bedside   Code called during dialysis  Patient became unresponsive and V Tach arrest. Code blue called,  Currently receiving Chest compressions   Initial ROSC but now has become pulseless. Discussed with daughter - Patient was apparently DNR at dialysis center. Daughter does want everything done at this time.    LOS: [place] @TODAY @6 :32 AM

## 2019-11-03 NOTE — Code Documentation (Signed)
  Patient Name: Brittney Contreras   MRN: [id number]   Date of Birth/ Sex: 05/03/1976 , female      Admission Date: 10/26/2019  Attending Provider: Thomes Lolling*  Primary Diagnosis: Graft revision   Indication: Pt was in her usual state of health until this AM, when she was being started on HD and appeared to have a seizure and pulse stopped. Code blue was subsequently called. At the time of arrival on scene, ACLS protocol was underway.   Technical Description:  - CPR performance duration:  2 rounds: 1st 21 minutes, 2nd 10+ mins  - Was defibrillation or cardioversion used? Yes   - Was external pacer placed? No  - Was patient intubated pre/post CPR? Yes, during CPR   Medications Administered: Y = Yes; Blank = No Amiodarone  Y  Atropine    Calcium  Y  Epinephrine  Y  Lidocaine  Y  Magnesium  Y  Norepinephrine    Phenylephrine    Sodium bicarbonate  Y  Vasopressin     Post CPR evaluation:  - Final Status - Was patient successfully resuscitated ? Yes, subsequently EtCO2 dropped from 30s to 10s, pulse lost, CPR repeated, ROSC achieved 2nd time, pulse lost again and TOD called at 8:47  Miscellaneous Information:  - Labs sent, including: pending  - Primary team notified?  Yes  - Family Notified? Yes        Gladys Damme, MD  12-Nov-2019, 8:55 AM

## 2019-11-03 NOTE — Progress Notes (Addendum)
Progress Note    2019-10-30 7:25 AM 1 Day Post-Op  Subjective:  Seen in HD unit. Agitated, initially uncooperative. Tachypneic. Will squeeze my hand with her right hand. Treatment via TDC  Vitals:   30-Oct-2019 0244 Oct 30, 2019 0545  BP: 128/70 (!) 113/54  Pulse: 89 91  Resp: 18 20  Temp: 97.7 F (36.5 C) (!) 97.4 F (36.3 C)  SpO2: 100% 100%    Physical Exam: Cardiac:  Tele: sinus tach 111 Lungs:  CTA bil Incisions:  Right upper arm staple lines well approximated. No oozing Extremities:  5/5 right hand grip strength. Arm is edematous and I cannot detect bruit in graft  CXR yesterday: FINDINGS: Single frontal view of the chest demonstrates right internal jugular dialysis catheter tip overlying the right atrium. Cardiac silhouette is enlarged but stable. No airspace disease, effusion, or pneumothorax. No acute bony abnormalities.  IMPRESSION: 1. Dialysis catheter as above.  No acute process.  CBC    Component Value Date/Time   WBC 6.2 10/21/2019 1359   RBC 3.24 (L) 10/21/2019 1359   HGB 9.9 (L) 10/08/2019 0932   HGB 13.4 03/28/2017 1220   HCT 29.0 (L) 10/19/2019 0932   HCT 39.5 03/28/2017 1220   PLT 149 (L) 10/21/2019 1359   PLT 181 03/28/2017 1220   MCV 95.7 10/21/2019 1359   MCV 84 03/28/2017 1220   MCH 32.4 10/21/2019 1359   MCHC 33.9 10/21/2019 1359   RDW 14.6 10/21/2019 1359   RDW 16.3 (H) 03/28/2017 1220   LYMPHSABS 1.1 11/17/2018 1228   LYMPHSABS 2.1 09/11/2016 1113   MONOABS 0.6 11/17/2018 1228   EOSABS 0.3 11/17/2018 1228   EOSABS 0.1 09/11/2016 1113   BASOSABS 0.0 11/17/2018 1228   BASOSABS 0.0 09/11/2016 1113    BMET    Component Value Date/Time   NA 140 11/01/2019 0932   NA 146 (H) 03/28/2017 1220   K 4.3 10/10/2019 0932   CL 110 10/24/2019 0932   CO2 21 (L) 10/21/2019 1359   GLUCOSE 85 10/11/2019 0932   BUN 57 (H) 11/02/2019 0932   BUN 39 (H) 03/28/2017 1220   CREATININE >18.00 (H) 10/03/2019 0932   CREATININE 3.18 (H) 05/03/2016 1028    CALCIUM 9.1 10/21/2019 1359   GFRNONAA 2 (L) 10/21/2019 1359   GFRNONAA 17 (L) 05/03/2016 1028   GFRAA 3 (L) 10/21/2019 1359   GFRAA 20 (L) 05/03/2016 1028     Intake/Output Summary (Last 24 hours) at Oct 30, 2019 0725 Last data filed at 10/30/19 0606 Gross per 24 hour  Intake 1390 ml  Output 600 ml  Net 790 ml    HOSPITAL MEDICATIONS Scheduled Meds: . amLODipine  10 mg Oral QHS  . Chlorhexidine Gluconate Cloth  6 each Topical Daily  . [START ON 10/29/2019] cinacalcet  30 mg Oral Once per day on Mon Thu  . darbepoetin (ARANESP) injection - DIALYSIS  25 mcg Intravenous Q Wed-HD  . doxercalciferol  7 mcg Intravenous Q M,W,F-HD  . pantoprazole  40 mg Oral Daily  . sevelamer carbonate  800 mg Oral TID WC   Continuous Infusions: PRN Meds:.acetaminophen **OR** acetaminophen, alum & mag hydroxide-simeth, guaiFENesin-dextromethorphan, hydrALAZINE, HYDROmorphone (DILAUDID) injection, labetalol, metoprolol tartrate, ondansetron, oxyCODONE-acetaminophen, phenol  Assessment: My first encounter with patient AMS with normal BP. Rapid drop in  heart rate with loss of response. Code blue called      Plan: Dr Donzetta Matters notified -    Risa Grill, PA-C [facility] [phone] 30-Oct-2019  7:25 AM   Addendum: I  was notified of patient coding and respond to the bedside.  ACLS protocol was undergoing I cannot evaluate at the time for cause.  Certainly she had significant blood loss yesterday in the operating room but did appear to be hemodynamically stable overnight.  I was notified that she did have blood coming from her upper extremity incisions during the code event that had not been noted previously.  Unfortunately patient expired.  I called and discussed with her next of kin her cousin Marzetta Board wanted me to notify after surgery yesterday as she has no children or other surviving family members.  A post was not requested.[place] Donzetta Matters, MD

## 2019-11-03 NOTE — Anesthesia Procedure Notes (Signed)
Procedure Name: Intubation Date/Time: 11-08-19 8:01 AM Performed by: Renato Shin, CRNA Pre-anesthesia Checklist: Patient identified, Emergency Drugs available, Suction available and Patient being monitored Patient Re-evaluated:Patient Re-evaluated prior to induction Oxygen Delivery Method: Ambu bag Preoxygenation: Pre-oxygenation with 100% oxygen Ventilation: Mask ventilation without difficulty Laryngoscope Size: Glidescope and 3 Grade View: Grade I Tube type: Oral Tube size: 7.5 mm Number of attempts: 1 Airway Equipment and Method: Stylet and Oral airway Placement Confirmation: ETT inserted through vocal cords under direct vision,  positive ETCO2 and breath sounds checked- equal and bilateral Secured at: 23 cm Tube secured with: Tape Dental Injury: Teeth and Oropharynx as per pre-operative assessment

## 2019-11-03 NOTE — Progress Notes (Signed)
7998 Dialysis machine alarming VP, system clotted, while pulling patient up in bed to set up new system  this Nurse noted sanguinous oozing from Right upper arm thrombectomy site. 0755 seizure noted, lasting approximately 20 seconds. Patient stopped breathing, 0758 code called, CPR initiated. 0824 code ended rhythm and pulse present. 0829 PEA , Code in progress,  Emergency blood ordered and verified by Lonia Skinner, approximately 200 ml given at 0847 code called.

## 2019-11-03 NOTE — Discharge Summary (Signed)
  Discharge Summary  Patient ID: Brittney Contreras [id number] 44 y.o. Dec 14, 1975  Admit date: 10/10/2019  Discharge date and time: 11/21/2019   Admitting Physician: [name], MD   Discharge Physician: same  Admission Diagnoses: ESRD on dialysis [facility]) [N18.6, Z99.2]  Discharge Diagnoses: Patient expired  Admission Condition: poor  Discharged Condition: expired  Indication for Admission: Admission following extensive revision of R arm fistula and placement of [facility]  Hospital Course: Ms. Brittney Contreras is a 44 year old female who was brought in as an outpatient and underwent extensive revision of right arm AV fistula and placement of a TDC.  She tolerated the procedure well and was admitted to the hospital postoperatively.  Nephrology was consulted for management of end-stage renal disease on hemodialysis while inpatient.  POD #1 she was on hemodialysis when she developed a ventricular rhythm followed by pulseless electrical activity.  CODE BLUE was called and ACLS protocol was carried out however was unsuccessful.  TOD 8:47.   Consults: nephrology  Treatments: surgery: Revision of R arm AVF and placement of [facility]  Discharge Exam: See progress note 11/21/19  Disposition:  Expired  Signed: Dagoberto Ligas, PA-C 2019-11-21 10:33 AM [facility] Office: [phone]

## 2019-11-03 DEATH — deceased

## 2020-08-06 IMAGING — DX LEFT FEMUR 2 VIEWS
4 series · 4 of 4 positions shown · non-contrast
Comparison: None.

CLINICAL DATA: Left leg pain

EXAM:
PELVIS - 1-2 VIEW; LEFT FEMUR 2 VIEWS

[t femur proximal ap left]
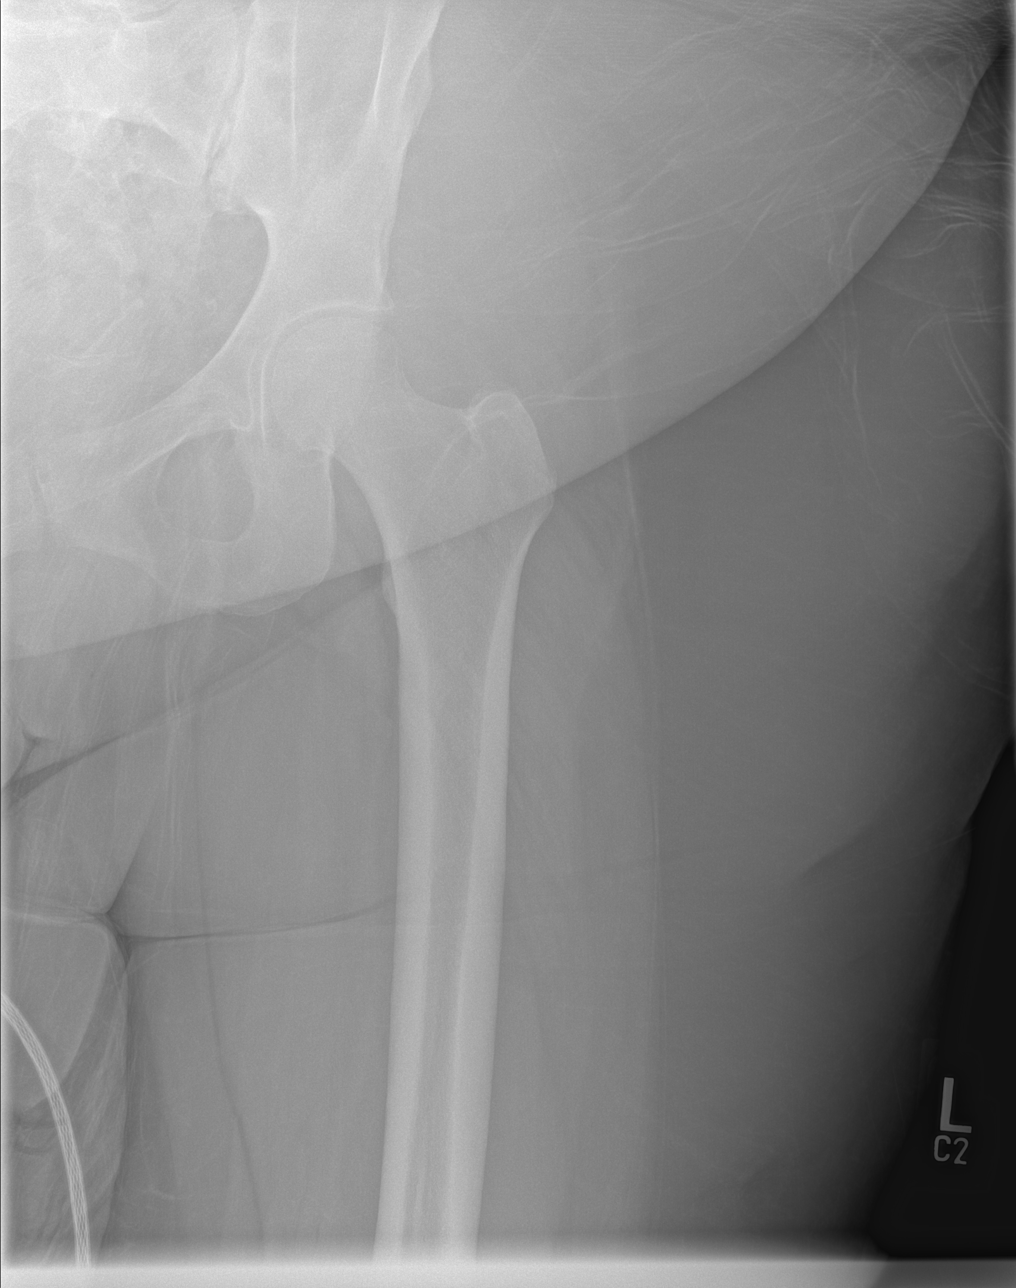

[t femur distal ap left]
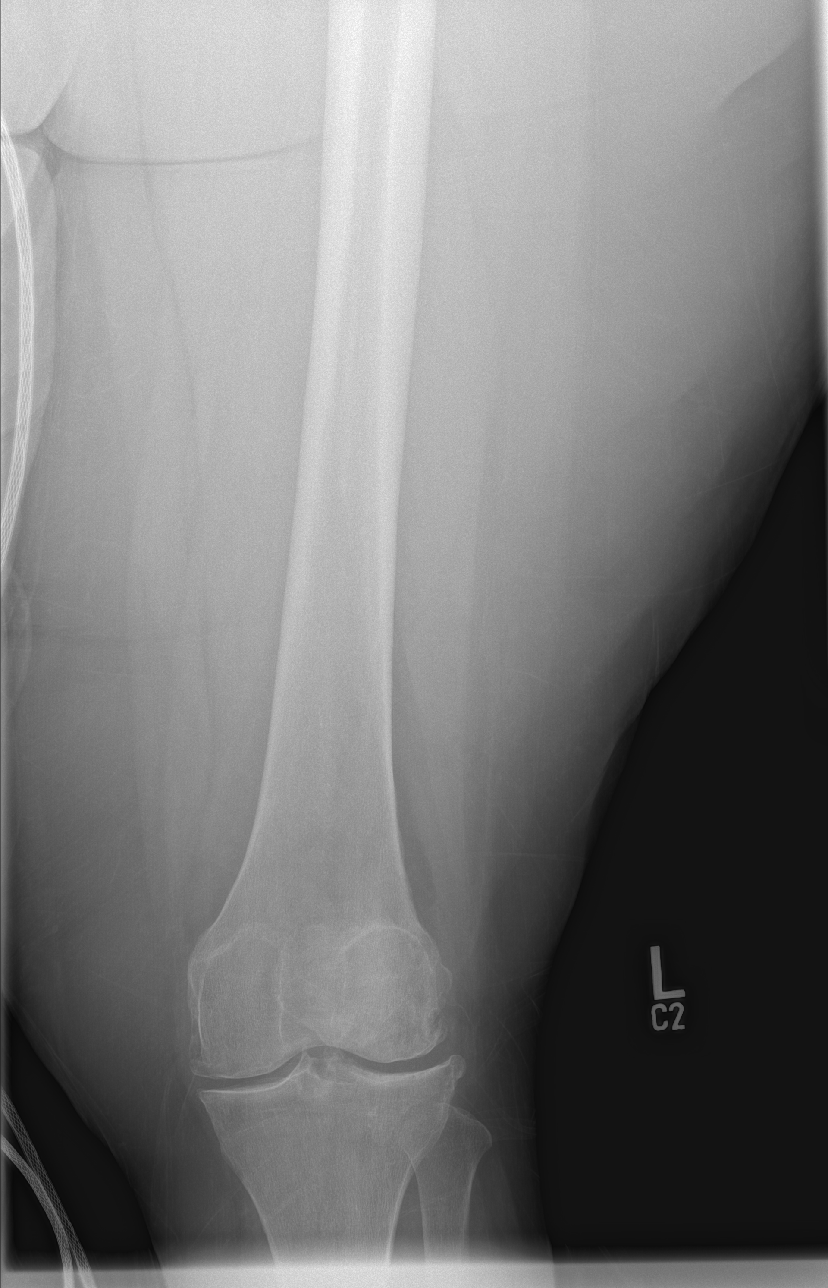

[t femur distal lat left]
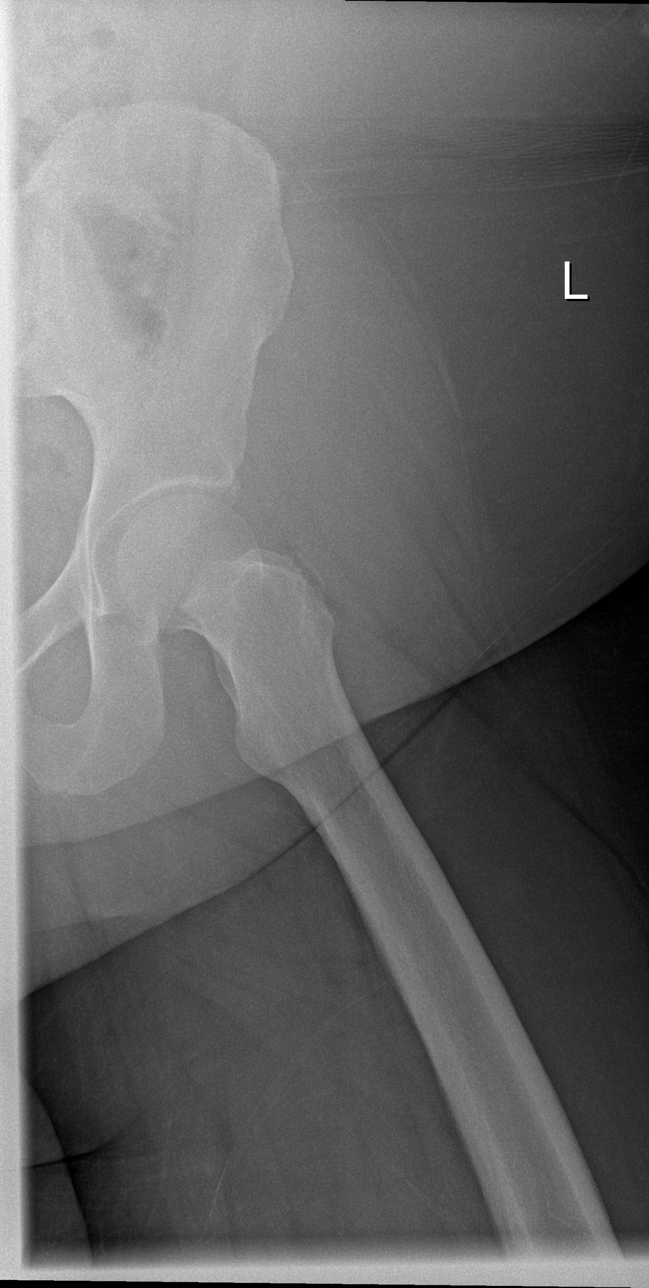

[t femur proximal lat left]
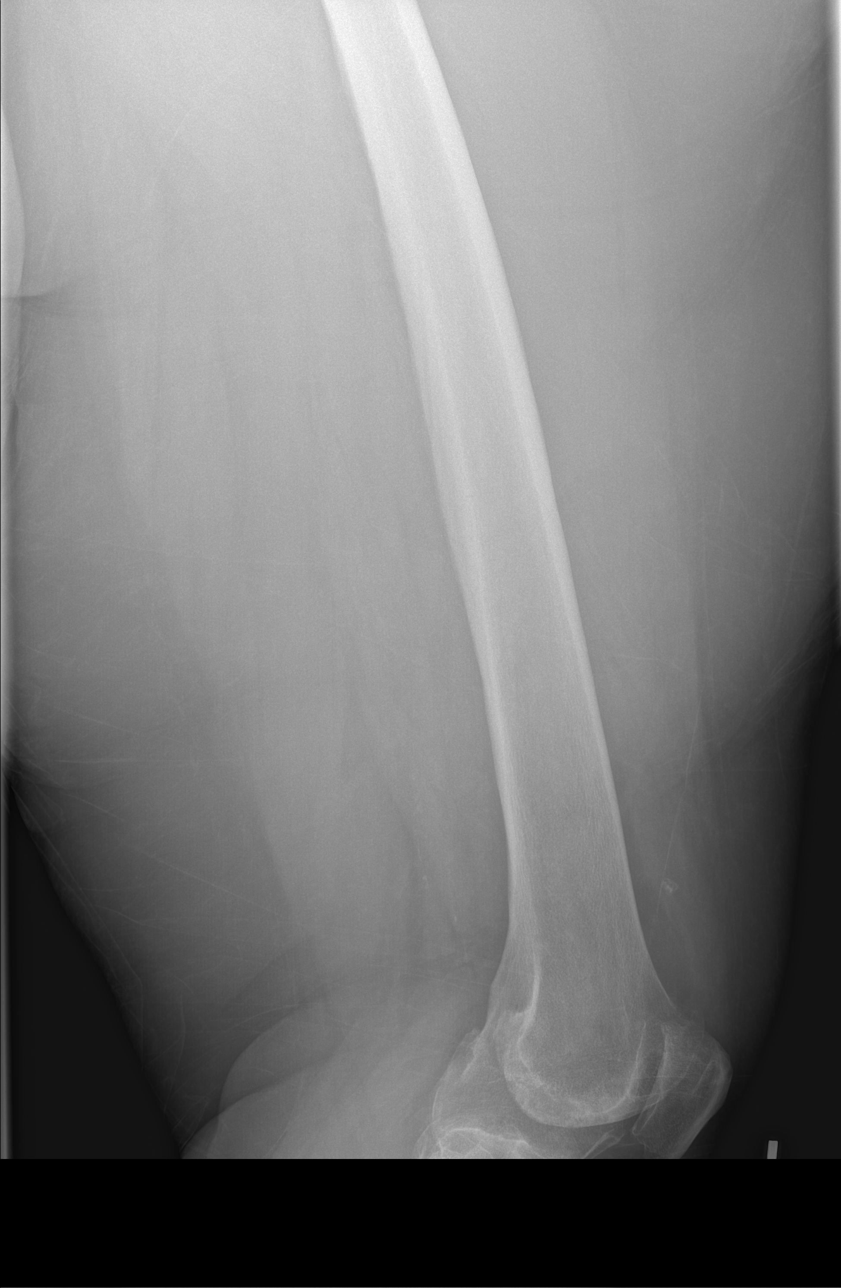

[4 of 4 positions shown; findings below may reference images not displayed]

FINDINGS: No fracture or dislocation of the left hip or left femur. The
partially imaged pelvis and proximal right femur are unremarkable in
single AP view only.

Moderate tricompartmental arthrosis of the included left knee with a
calcified loose body in the superior joint recess.
IMPRESSION: 1. No fracture or dislocation of the left hip or left femur. The
partially imaged pelvis and proximal right femur are unremarkable in
single AP view only.

2. Moderate tricompartmental arthrosis of the included left knee
with a calcified loose body in the superior joint recess.
